# Patient Record
Sex: Female | Born: 1960 | Hispanic: No | Marital: Single | Smoking: Never smoker
Health system: Southern US, Community
[De-identification: ages and names within clinical notes are randomized; demographics above are authoritative.]

## PROBLEM LIST (undated history)

## (undated) DIAGNOSIS — I509 Heart failure, unspecified: Secondary | ICD-10-CM

## (undated) DIAGNOSIS — I10 Essential (primary) hypertension: Secondary | ICD-10-CM

## (undated) DIAGNOSIS — J9691 Respiratory failure, unspecified with hypoxia: Secondary | ICD-10-CM

## (undated) DIAGNOSIS — Z6832 Body mass index (BMI) 32.0-32.9, adult: Secondary | ICD-10-CM

## (undated) DIAGNOSIS — D509 Iron deficiency anemia, unspecified: Secondary | ICD-10-CM

## (undated) DIAGNOSIS — I5032 Chronic diastolic (congestive) heart failure: Secondary | ICD-10-CM

## (undated) DIAGNOSIS — E66811 Obesity, class 1: Secondary | ICD-10-CM

## (undated) DIAGNOSIS — E669 Obesity, unspecified: Secondary | ICD-10-CM

## (undated) DIAGNOSIS — R6510 Systemic inflammatory response syndrome (SIRS) of non-infectious origin without acute organ dysfunction: Secondary | ICD-10-CM

## (undated) DIAGNOSIS — I272 Pulmonary hypertension, unspecified: Secondary | ICD-10-CM

## (undated) HISTORY — DX: Heart failure, unspecified: I50.9

## (undated) HISTORY — PX: APPENDECTOMY: SHX54

---

## 2007-11-20 ENCOUNTER — Emergency Department (HOSPITAL_COMMUNITY): Admission: EM | Admit: 2007-11-20 | Discharge: 2007-11-20 | Payer: Self-pay | Admitting: Emergency Medicine

## 2013-11-16 ENCOUNTER — Emergency Department (HOSPITAL_COMMUNITY)
Admission: EM | Admit: 2013-11-16 | Discharge: 2013-11-16 | Disposition: A | Discharge status: Home / Self Care | Payer: BC Managed Care – PPO | Attending: Emergency Medicine | Admitting: Emergency Medicine

## 2013-11-16 ENCOUNTER — Emergency Department (HOSPITAL_COMMUNITY): Payer: BC Managed Care – PPO

## 2013-11-16 ENCOUNTER — Encounter (HOSPITAL_COMMUNITY): Payer: Self-pay | Admitting: Emergency Medicine

## 2013-11-16 DIAGNOSIS — B9789 Other viral agents as the cause of diseases classified elsewhere: Secondary | ICD-10-CM | POA: Insufficient documentation

## 2013-11-16 DIAGNOSIS — J9801 Acute bronchospasm: Secondary | ICD-10-CM | POA: Insufficient documentation

## 2013-11-16 DIAGNOSIS — B349 Viral infection, unspecified: Secondary | ICD-10-CM

## 2013-11-16 DIAGNOSIS — R059 Cough, unspecified: Secondary | ICD-10-CM

## 2013-11-16 DIAGNOSIS — M25559 Pain in unspecified hip: Secondary | ICD-10-CM | POA: Insufficient documentation

## 2013-11-16 DIAGNOSIS — R05 Cough: Secondary | ICD-10-CM

## 2013-11-16 LAB — I-STAT TROPONIN, ED: Troponin i, poc: 0 ng/mL (ref 0.00–0.08)

## 2013-11-16 LAB — BASIC METABOLIC PANEL
BUN: 14 mg/dL (ref 6–23)
CALCIUM: 9.5 mg/dL (ref 8.4–10.5)
CHLORIDE: 101 meq/L (ref 96–112)
CO2: 24 meq/L (ref 19–32)
CREATININE: 0.46 mg/dL — AB (ref 0.50–1.10)
GFR calc Af Amer: >90 mL/min (ref >90)
GFR calc non Af Amer: >90 mL/min (ref >90)
GLUCOSE: 100 mg/dL — AB (ref 70–99)
Potassium: 3.9 mEq/L (ref 3.7–5.3)
Sodium: 138 mEq/L (ref 137–147)

## 2013-11-16 LAB — CBC
HEMATOCRIT: 36.8 % (ref 36.0–46.0)
Hemoglobin: 12.7 g/dL (ref 12.0–15.0)
MCH: 21.9 pg — AB (ref 26.0–34.0)
MCHC: 34.5 g/dL (ref 30.0–36.0)
MCV: 63.4 fL — AB (ref 78.0–100.0)
Platelets: 276 10*3/uL (ref 150–400)
RBC: 5.8 MIL/uL — AB (ref 3.87–5.11)
RDW: 15.8 % — ABNORMAL HIGH (ref 11.5–15.5)
WBC: 16.8 10*3/uL — AB (ref 4.0–10.5)

## 2013-11-16 LAB — D-DIMER, QUANTITATIVE (NOT AT ARMC): D DIMER QUANT: 1.33 ug{FEU}/mL — AB (ref 0.00–0.48)

## 2013-11-16 LAB — PRO B NATRIURETIC PEPTIDE: PRO B NATRI PEPTIDE: 153.8 pg/mL — AB (ref 0–125)

## 2013-11-16 MED ORDER — AEROCHAMBER PLUS W/MASK MISC
1.0000 | Freq: Once | Status: DC
  Filled 2013-11-16: qty 1

## 2013-11-16 MED ORDER — IOHEXOL 350 MG/ML SOLN
100.0000 mL | Freq: Once | INTRAVENOUS | Status: AC | PRN
  Administered 2013-11-16: 100 mL via INTRAVENOUS

## 2013-11-16 MED ORDER — ALBUTEROL SULFATE (2.5 MG/3ML) 0.083% IN NEBU
5.0000 mg | INHALATION_SOLUTION | Freq: Once | RESPIRATORY_TRACT | Status: AC
  Administered 2013-11-16: 5 mg via RESPIRATORY_TRACT
  Filled 2013-11-16: qty 6

## 2013-11-16 MED ORDER — ALBUTEROL SULFATE HFA 108 (90 BASE) MCG/ACT IN AERS
2.0000 | INHALATION_SPRAY | RESPIRATORY_TRACT | Status: DC | PRN
  Filled 2013-11-16: qty 6.7

## 2013-11-16 NOTE — Discharge Instructions (Signed)
Return to the ED with any concerns including difficulty breathing despite using albuterol every 4 hours, not drinking fluids, decreased urine output, vomiting and not able to keep down liquids or medications, decreased level of alertness/lethargy, or any other alarming symptoms °

## 2013-11-16 NOTE — ED Notes (Signed)
Respiratory called for spacer with mask.

## 2013-11-16 NOTE — ED Provider Notes (Signed)
CSN: [id number]     Arrival date & time 11/16/13  1104 History   First MD Initiated Contact with Patient 11/16/13 1232     Chief Complaint  Patient presents with  . Cough  . Shortness of Breath  . Hip Pain     (Consider location/radiation/quality/duration/timing/severity/associated sxs/prior Treatment) HPI Pt presents with c/o cough and shortness of breath.  Pt has been coughing up mucous.  No fever/chills.  Feels chest pain with coughing.  No leg swelling, no recent travel/trauma/surgery.  No recent sick contacts.  Has also c/o body aches and pain on right side of chest and left hip.  No difficulty walking.  No falls or trauma.  There are no other associated systemic symptoms, there are no other alleviating or modifying factors.   History reviewed. No pertinent past medical history. Past Surgical History  Procedure Laterality Date  . Appendectomy     History reviewed. No pertinent family history. History  Substance Use Topics  . Smoking status: Never Smoker   . Smokeless tobacco: Not on file  . Alcohol Use: No   OB History   Grav Para Term Preterm Abortions TAB SAB Ect Mult Living                 Review of Systems ROS reviewed and all otherwise negative except for mentioned in HPI    Allergies  Review of patient's allergies indicates no known allergies.  Home Medications   Prior to Admission medications   Medication Sig Start Date End Date Taking? Authorizing Provider  acetaminophen (TYLENOL) 325 MG tablet Take 650 mg by mouth every 6 (six) hours as needed (pain).   Yes Historical Provider, MD  Pseudoephedrine-APAP-DM (DAYQUIL MULTI-SYMPTOM COLD/FLU PO) Take 30 mLs by mouth 2 (two) times daily as needed (flu-like symptoms ( cough)).   Yes Historical Provider, MD   BP 176/90  Pulse 96  Temp(Src) 98.7 F (37.1 C) (Oral)  Resp 20  SpO2 95% Vitals reviewed Physical Exam Physical Examination: General appearance - alert, well appearing, and in no distress Mental  status - alert, oriented to person, place, and time Eyes - no conjunctival injection, no scleral icterus Mouth - mucous membranes moist, pharynx normal without lesions Chest - clear to auscultation, no wheezes, rales or rhonchi, symmetric air entry Heart - normal rate, regular rhythm, normal S1, S2, no murmurs, rubs, clicks or gallops Abdomen - soft, nontender, nondistended, no masses or organomegaly Extremities - peripheral pulses normal, no pedal edema, no clubbing or cyanosis Skin - normal coloration and turgor, no rashes  ED Course  Procedures (including critical care time) Labs Review Labs Reviewed  CBC - Abnormal; Notable for the following:    WBC 16.8 (*)    RBC 5.80 (*)    MCV 63.4 (*)    MCH 21.9 (*)    RDW 15.8 (*)    All other components within normal limits  BASIC METABOLIC PANEL - Abnormal; Notable for the following:    Glucose, Bld 100 (*)    Creatinine, Ser 0.46 (*)    All other components within normal limits  PRO B NATRIURETIC PEPTIDE - Abnormal; Notable for the following:    Pro B Natriuretic peptide (BNP) 153.8 (*)    All other components within normal limits  D-DIMER, QUANTITATIVE - Abnormal; Notable for the following:    D-Dimer, Quant 1.33 (*)    All other components within normal limits  I-STAT TROPOININ, ED    Imaging Review Ct Angio Chest Pe W/cm &/or  Wo Cm  11/16/2013   CLINICAL DATA:  Upper chest pain.  Evaluate for PE.  EXAM: CT ANGIOGRAPHY CHEST WITH CONTRAST  TECHNIQUE: Multidetector CT imaging of the chest was performed using the standard protocol during bolus administration of intravenous contrast. Multiplanar CT image reconstructions and MIPs were obtained to evaluate the vascular anatomy.  CONTRAST:  100mL OMNIPAQUE IOHEXOL 350 MG/ML SOLN  COMPARISON:  Chest x-ray earlier today.  FINDINGS: Motion degraded exam. Decrease sensitivity in the detection of pulmonary emboli due to inability to suspend respiration.  Mediastinum: No filling defects are  noted within the pulmonary arterial tree to suggest pulmonary emboli. Heart size is increased. No pericardial fluid, thickening or calcification. No acute abnormality of the thoracic aorta or other great vessels of the mediastinum. No pathologically enlarged mediastinal or hilar lymph nodes. Old granulomatous changes. The esophagus is normal in appearance.  Lungs/Pleura: No consolidative airspace disease. No pleural effusions. No suspicious appearing pulmonary nodules or masses. Scattered granulomata.  Upper Abdomen: Visualized portions of the upper abdomen are unremarkable.  Musculoskeletal: No aggressive appearing lytic or blastic lesions are noted in the visualized portions of the skeleton.  Review of the MIP images confirms the above findings.  IMPRESSION: Sequelae of remote granulomatous disease. Cardiomegaly. No active infiltrates.  Respiratory motion degraded exam without visible pulmonary emboli. Sensitivity is reduced however for the detection of small subsegmental defects.   Electronically Signed   By: Davonna BellingJohn  Curnes M.D.   On: 11/16/2013 14:25   Dg Chest Port 1 View  11/16/2013   CLINICAL DATA:  Cough and short of breath  EXAM: PORTABLE CHEST - 1 VIEW  COMPARISON:  None.  FINDINGS: Mild cardiac enlargement without heart failure. Mild right lower lobe atelectasis. Negative for pneumonia or effusion.  IMPRESSION: No active disease.   Electronically Signed   By: Marlan Palauharles  Clark M.D.   On: 11/16/2013 13:01     EKG Interpretation   Date/Time:  Tuesday Nov 16 2013 12:31:22 EDT Ventricular Rate:  87 PR Interval:  154 QRS Duration: 74 QT Interval:  384 QTC Calculation: 462 R Axis:   73 Text Interpretation:  Sinus rhythm Consider left ventricular hypertrophy  No old tracing to compare Confirmed by [facility] Center[facility]  MD, Shantoya Geurts [phone]) on  11/16/2013 3:12:22 PM      MDM   Final diagnoses:  Cough  Bronchospasm  Viral infection    Pt presenting with c/o productive cough, mild wheezing on exam.  Felt  improved after albuterol in the ED.  No PE on CT scan after elevated d-dimer.  Pt discharged with albuterol inhaler for home use. Advised supportive care, ibuprofen for body aches.  Discharged with strict return precautions.  Pt agreeable with plan.    Ethelda ChickMartha K Linker, MD 11/17/13 [phone]

## 2013-11-16 NOTE — ED Notes (Signed)
Pt c/o productive cough x 4 days.  States she feels SOB at rest.  Pt's RA sat was 98%. Also c/o Lt hip pain since Friday.  No injury.

## 2015-07-10 ENCOUNTER — Observation Stay (HOSPITAL_COMMUNITY)
Admission: EM | Admit: 2015-07-10 | Discharge: 2015-07-12 | Disposition: A | Discharge status: Home / Self Care | Payer: Self-pay | Attending: Internal Medicine | Admitting: Internal Medicine

## 2015-07-10 ENCOUNTER — Emergency Department (HOSPITAL_COMMUNITY): Payer: Self-pay

## 2015-07-10 ENCOUNTER — Encounter (HOSPITAL_COMMUNITY): Payer: Self-pay | Admitting: *Deleted

## 2015-07-10 DIAGNOSIS — I509 Heart failure, unspecified: Secondary | ICD-10-CM

## 2015-07-10 DIAGNOSIS — R0789 Other chest pain: Secondary | ICD-10-CM | POA: Insufficient documentation

## 2015-07-10 DIAGNOSIS — R7989 Other specified abnormal findings of blood chemistry: Secondary | ICD-10-CM | POA: Insufficient documentation

## 2015-07-10 DIAGNOSIS — IMO0001 Reserved for inherently not codable concepts without codable children: Secondary | ICD-10-CM

## 2015-07-10 DIAGNOSIS — I10 Essential (primary) hypertension: Secondary | ICD-10-CM | POA: Insufficient documentation

## 2015-07-10 DIAGNOSIS — I119 Hypertensive heart disease without heart failure: Secondary | ICD-10-CM

## 2015-07-10 DIAGNOSIS — R0609 Other forms of dyspnea: Principal | ICD-10-CM | POA: Diagnosis present

## 2015-07-10 DIAGNOSIS — R030 Elevated blood-pressure reading, without diagnosis of hypertension: Secondary | ICD-10-CM

## 2015-07-10 DIAGNOSIS — I43 Cardiomyopathy in diseases classified elsewhere: Secondary | ICD-10-CM

## 2015-07-10 DIAGNOSIS — R0602 Shortness of breath: Secondary | ICD-10-CM | POA: Insufficient documentation

## 2015-07-10 DIAGNOSIS — R2240 Localized swelling, mass and lump, unspecified lower limb: Secondary | ICD-10-CM | POA: Insufficient documentation

## 2015-07-10 HISTORY — DX: Essential (primary) hypertension: I10

## 2015-07-10 LAB — BASIC METABOLIC PANEL
Anion gap: 11 (ref 5–15)
BUN: 12 mg/dL (ref 6–20)
CHLORIDE: 104 mmol/L (ref 101–111)
CO2: 26 mmol/L (ref 22–32)
CREATININE: 0.6 mg/dL (ref 0.44–1.00)
Calcium: 8.8 mg/dL — ABNORMAL LOW (ref 8.9–10.3)
GFR calc Af Amer: >60 mL/min (ref >60)
GFR calc non Af Amer: >60 mL/min (ref >60)
Glucose, Bld: 132 mg/dL — ABNORMAL HIGH (ref 65–99)
POTASSIUM: 3.7 mmol/L (ref 3.5–5.1)
SODIUM: 141 mmol/L (ref 135–145)

## 2015-07-10 LAB — CBC
HEMATOCRIT: 38.9 % (ref 36.0–46.0)
Hemoglobin: 12.8 g/dL (ref 12.0–15.0)
MCH: 22 pg — AB (ref 26.0–34.0)
MCHC: 32.9 g/dL (ref 30.0–36.0)
MCV: 66.7 fL — AB (ref 78.0–100.0)
PLATELETS: 250 10*3/uL (ref 150–400)
RBC: 5.83 MIL/uL — ABNORMAL HIGH (ref 3.87–5.11)
RDW: 16.7 % — AB (ref 11.5–15.5)
WBC: 12.2 10*3/uL — AB (ref 4.0–10.5)

## 2015-07-10 LAB — I-STAT TROPONIN, ED: Troponin i, poc: 0.03 ng/mL (ref 0.00–0.08)

## 2015-07-10 LAB — BRAIN NATRIURETIC PEPTIDE: B NATRIURETIC PEPTIDE 5: 567.3 pg/mL — AB (ref 0.0–100.0)

## 2015-07-10 MED ORDER — ALBUTEROL SULFATE (2.5 MG/3ML) 0.083% IN NEBU
5.0000 mg | INHALATION_SOLUTION | Freq: Once | RESPIRATORY_TRACT | Status: AC
  Administered 2015-07-10: 5 mg via RESPIRATORY_TRACT

## 2015-07-10 MED ORDER — LABETALOL HCL 5 MG/ML IV SOLN
10.0000 mg | Freq: Once | INTRAVENOUS | Status: AC
  Administered 2015-07-10: 10 mg via INTRAVENOUS
  Filled 2015-07-10: qty 4

## 2015-07-10 MED ORDER — FUROSEMIDE 10 MG/ML IJ SOLN
40.0000 mg | Freq: Once | INTRAMUSCULAR | Status: AC
  Administered 2015-07-10: 40 mg via INTRAVENOUS
  Filled 2015-07-10: qty 4

## 2015-07-10 MED ORDER — ALBUTEROL SULFATE (2.5 MG/3ML) 0.083% IN NEBU
INHALATION_SOLUTION | RESPIRATORY_TRACT | Status: AC
  Filled 2015-07-10: qty 6

## 2015-07-10 NOTE — ED Notes (Signed)
Called lab ref. BNP.  St's they have blood and will run it

## 2015-07-10 NOTE — ED Provider Notes (Signed)
CSN: [id number]     Arrival date & time 07/10/15  1649 History   First MD Initiated Contact with Patient 07/10/15 2102     Chief Complaint  Patient presents with  . Cough  . Shortness of Breath     (Consider location/radiation/quality/duration/timing/severity/associated sxs/prior Treatment) Patient is a 54 y.o. female presenting with shortness of breath. The history is provided by the patient and a relative.  Shortness of Breath Severity:  Moderate Onset quality:  Gradual Duration:  2 months Timing:  Constant Progression:  Worsening Chronicity:  New Context: not strong odors and not URI   Relieved by:  Nothing Worsened by:  Activity and exertion Ineffective treatments:  Rest and position changes Associated symptoms: chest pain and cough   Associated symptoms: no abdominal pain, no claudication, no diaphoresis, no ear pain, no fever, no headaches, no hemoptysis, no neck pain, no rash, no sore throat, no sputum production, no syncope, no vomiting and no wheezing   Risk factors: obesity   Risk factors: no hx of PE/DVT and no prolonged immobilization     Past Medical History  Diagnosis Date  . Hypertension    Past Surgical History  Procedure Laterality Date  . Appendectomy     History reviewed. No pertinent family history. Social History  Substance Use Topics  . Smoking status: Never Smoker   . Smokeless tobacco: None  . Alcohol Use: No   OB History    No data available     Review of Systems  Constitutional: Negative for fever and diaphoresis.  HENT: Negative for ear pain and sore throat.   Eyes: Negative for pain.  Respiratory: Positive for cough, chest tightness and shortness of breath. Negative for hemoptysis, sputum production and wheezing.   Cardiovascular: Positive for chest pain and leg swelling. Negative for claudication and syncope.  Gastrointestinal: Negative for nausea, vomiting, abdominal pain and diarrhea.  Genitourinary: Negative for dysuria.   Musculoskeletal: Negative for neck pain.  Skin: Negative for rash.  Neurological: Negative for dizziness and headaches.      Allergies  Review of patient's allergies indicates no known allergies.  Home Medications   Prior to Admission medications   Medication Sig Start Date End Date Taking? Authorizing Provider  acetaminophen (TYLENOL) 325 MG tablet Take 650 mg by mouth every 6 (six) hours as needed (pain).   Yes Historical Provider, MD  Pseudoephedrine-APAP-DM (DAYQUIL MULTI-SYMPTOM COLD/FLU PO) Take 30 mLs by mouth 2 (two) times daily as needed (flu-like symptoms ( cough)).   Yes Historical Provider, MD   BP 164/93 mmHg  Pulse 90  Temp(Src) 98.3 F (36.8 C) (Oral)  Resp 37  Ht  (1.575 m)  Wt 81.761 kg  BMI 32.96 kg/m2  SpO2 99% Physical Exam  Constitutional: She appears well-developed and well-nourished. She appears ill.  HENT:  Head: Head is without abrasion and without contusion.  Mouth/Throat: Oropharynx is clear and moist and mucous membranes are normal.  Eyes: Conjunctivae and EOM are normal. Pupils are equal, round, and reactive to light.  Neck: Normal range of motion.  Cardiovascular: Normal rate and regular rhythm.  Exam reveals no decreased pulses.   No murmur heard. Pulmonary/Chest: Effort normal. No accessory muscle usage. No respiratory distress. She has wheezes (mild end expiratory wheezing consistent with cardiac wheeze). She has rales in the right lower field and the left lower field.  Abdominal: Normal appearance. She exhibits no distension and no fluid wave. There is no tenderness. There is no rigidity, no guarding, no tenderness  at McBurney's point and negative Murphy's sign.  Neurological: She is alert. She has normal strength. No cranial nerve deficit or sensory deficit. GCS eye subscore is 4. GCS verbal subscore is 5. GCS motor subscore is 6.  Skin: No rash noted.  Psychiatric: She has a normal mood and affect. Her speech is normal and behavior is  normal. Cognition and memory are normal.    ED Course  Procedures (including critical care time) Labs Review Labs Reviewed  BASIC METABOLIC PANEL - Abnormal; Notable for the following:    Glucose, Bld 132 (*)    Calcium 8.8 (*)    All other components within normal limits  CBC - Abnormal; Notable for the following:    WBC 12.2 (*)    RBC 5.83 (*)    MCV 66.7 (*)    MCH 22.0 (*)    RDW 16.7 (*)    All other components within normal limits  BRAIN NATRIURETIC PEPTIDE - Abnormal; Notable for the following:    B Natriuretic Peptide 567.3 (*)    All other components within normal limits  I-STAT TROPOININ, ED    Imaging Review Dg Chest 2 View  07/10/2015  CLINICAL DATA:  54 year old female with shortness of breath EXAM: CHEST  2 VIEW COMPARISON:  Chest CT 11/16/2013 FINDINGS: Two views of the chest demonstrate cardiomegaly with increased interstitial prominence may represent a degree of congestive changes. There is no focal consolidation, pleural effusion, or pneumothorax. Hilar calcified lymph nodes compatible with old granuloma. The osseous structures appear unremarkable. IMPRESSION: Cardiomegaly with mild congestive changes.  No focal consolidation. Electronically Signed   By: Elgie CollardArash  Radparvar M.D.   On: 07/10/2015 18:13   I have personally reviewed and evaluated these images and lab results as part of my medical decision-making.   EKG Interpretation   Date/Time:  Monday July 10 2015 16:51:29 EST Ventricular Rate:  105 PR Interval:  154 QRS Duration: 72 QT Interval:  346 QTC Calculation: 457 R Axis:   76 Text Interpretation:  Sinus tachycardia Right atrial enlargement Left  ventricular hypertrophy with repolarization abnormality Abnormal ECG since  last tracing no significant change Confirmed by MILLER  MD, BRIAN ([id number])  on 07/10/2015 9:34:37 PM      MDM   Final diagnoses:  Elevated BP  Elevated brain natriuretic peptide (BNP) level  Dyspnea on exertion     54 year old [place] female with no known past medical history presents the setting of dyspnea on exertion. Patient and family report over the last 2 months patient has had intermittent cough and shortness of breath which is worsened by any exertion. They reported the last several weeks she can now only take several steps without significant shortness of breath. They report the cough has been nonproductive at times she does have some mild chest pain. She denies any chest pain today. Family denies any fevers, rashes, recent sick contacts, recent travel, history of blood clots.  Initial vital signs revealed significant hypertension with systolics in the 200s. Patient given IV labetalol and IV Lasix and setting concern for CHF.  On examination patient does have bilateral lower lobe rales and mild end expiratory wheezing consistent with cardiac wheeze. Patient with bilateral 2+ pitting edema. Bedside echo performed by myself and Dr. Hyacinth MeekerMiller consistent with left ventricular hypertrophy without pericardial effusion. No signs of obvious hypo-or hyperdynamic contractility. No significant RV dilatation. In setting of the patient's symptoms I'm concerned for CHF versus ACS versus pneumonia.  On reassessment after IV Lasix and antihypertensives patient's  blood pressure improved and symptoms improving.  Chest x-ray consistent with cardiomegaly and pulmonary congestion. No significant elevation in troponin. BNP elevated consistent with CHF. EKG reveals left ventricular hypertrophy. No significant electrolyte abnormalities noted. No ST segment elevation or depression or acute ischemia noted on EKG. As patient is significantly dyspneic on examination with any exertion will admit to medicine at this time for further management of likely CHF. Patient stable at time of admission. Case discussed with Dr. Julian Reil of Hospitalist group  Attending is seen and evaluated patient and Dr. Hyacinth Meeker is in agreement of  plan.    Stacy Gardner, MD 07/11/15 9563  Eber Hong, MD 07/11/15 [phone]

## 2015-07-10 NOTE — ED Notes (Signed)
Pt placed on 02 via [place] at 2LPM 

## 2015-07-10 NOTE — ED Provider Notes (Signed)
I saw and evaluated the patient, reviewed the resident's note and I agree with the findings and plan.  Pertinent History: The patient is a 54 year old female, she has no history of heart disease or lung disease, she presents with increased shortness of breath on exertion and orthopnea, she is obese Pertinent Exam findings: The patient has hypertension, she has a slight expiratory wheeze, she has no rales, she does have slight bilateral symmetrical pitting edema of the lower extremities, no JVD. Nontender abdomen, no distress. Normal respiratory rate, no increased work of breathing.  W/u for CHF, less likely reactive airway disease Appears to have LVH - cardiac enlargment  No effusion on bedside US - images not archived   EKG Interpretation  Date/Time:  Monday July 10 2015 16:51:29 EST Ventricular Rate:  105 PR Interval:  154 QRS Duration: 72 QT Interval:  346 QTC Calculation: 457 R Axis:   76 Text Interpretation:  Sinus tachycardia Right atrial enlargement Left ventricular hypertrophy with repolarization abnormality Abnormal ECG since last tracing no significant change Confirmed by Clyde Upshaw  MD, Shanta Hartner ([id number]) on 07/10/2015 9:34:37 PM       I personally interpreted the EKG as well as the resident and agree with the interpretation on the resident's chart.  Final diagnoses:  Elevated BP  Elevated brain natriuretic peptide (BNP) level  Dyspnea on exertion      Eber HongBrian Shandelle Borrelli, MD 07/11/15 1046

## 2015-07-10 NOTE — ED Notes (Signed)
Pt reports shortness of breath and cough x1 month, progressively worse - pt reports now she experiences dyspnea with exertion as well.

## 2015-07-10 NOTE — ED Notes (Signed)
Ambulated to restroom  

## 2015-07-11 ENCOUNTER — Observation Stay (HOSPITAL_BASED_OUTPATIENT_CLINIC_OR_DEPARTMENT_OTHER): Payer: Self-pay

## 2015-07-11 DIAGNOSIS — I119 Hypertensive heart disease without heart failure: Secondary | ICD-10-CM

## 2015-07-11 DIAGNOSIS — I110 Hypertensive heart disease with heart failure: Secondary | ICD-10-CM

## 2015-07-11 DIAGNOSIS — I509 Heart failure, unspecified: Secondary | ICD-10-CM

## 2015-07-11 DIAGNOSIS — I43 Cardiomyopathy in diseases classified elsewhere: Secondary | ICD-10-CM

## 2015-07-11 DIAGNOSIS — R0609 Other forms of dyspnea: Secondary | ICD-10-CM

## 2015-07-11 MED ORDER — FUROSEMIDE 40 MG PO TABS
40.0000 mg | ORAL_TABLET | Freq: Every day | ORAL | Status: DC
  Administered 2015-07-11 – 2015-07-12 (×2): 40 mg via ORAL
  Filled 2015-07-11 (×2): qty 1

## 2015-07-11 MED ORDER — SODIUM CHLORIDE 0.9 % IV SOLN: 250.0000 mL | INTRAVENOUS | Status: DC | PRN

## 2015-07-11 MED ORDER — SODIUM CHLORIDE 0.9 % IJ SOLN: 3.0000 mL | INTRAMUSCULAR | Status: DC | PRN

## 2015-07-11 MED ORDER — HEPARIN SODIUM (PORCINE) 5000 UNIT/ML IJ SOLN
5000.0000 [IU] | Freq: Three times a day (TID) | INTRAMUSCULAR | Status: DC
  Administered 2015-07-11 – 2015-07-12 (×4): 5000 [IU] via SUBCUTANEOUS
  Filled 2015-07-11 (×3): qty 1

## 2015-07-11 MED ORDER — INFLUENZA VAC SPLIT QUAD 0.5 ML IM SUSY
0.5000 mL | PREFILLED_SYRINGE | INTRAMUSCULAR | Status: AC
  Administered 2015-07-12: 0.5 mL via INTRAMUSCULAR
  Filled 2015-07-11: qty 0.5

## 2015-07-11 MED ORDER — ACETAMINOPHEN 325 MG PO TABS: 650.0000 mg | ORAL_TABLET | ORAL | Status: DC | PRN

## 2015-07-11 MED ORDER — SODIUM CHLORIDE 0.9 % IJ SOLN
3.0000 mL | Freq: Two times a day (BID) | INTRAMUSCULAR | Status: DC
  Administered 2015-07-11 – 2015-07-12 (×3): 3 mL via INTRAVENOUS

## 2015-07-11 MED ORDER — LISINOPRIL 5 MG PO TABS
5.0000 mg | ORAL_TABLET | Freq: Every day | ORAL | Status: DC
  Administered 2015-07-11: 5 mg via ORAL
  Filled 2015-07-11: qty 1

## 2015-07-11 MED ORDER — LABETALOL HCL 5 MG/ML IV SOLN: 10.0000 mg | INTRAVENOUS | Status: DC | PRN

## 2015-07-11 MED ORDER — ONDANSETRON HCL 4 MG/2ML IJ SOLN: 4.0000 mg | Freq: Four times a day (QID) | INTRAMUSCULAR | Status: DC | PRN

## 2015-07-11 MED ORDER — BENZONATATE 100 MG PO CAPS
100.0000 mg | ORAL_CAPSULE | Freq: Three times a day (TID) | ORAL | Status: DC | PRN
  Administered 2015-07-11 – 2015-07-12 (×2): 100 mg via ORAL
  Filled 2015-07-11: qty 1

## 2015-07-11 MED ORDER — METOPROLOL SUCCINATE ER 25 MG PO TB24
12.5000 mg | ORAL_TABLET | Freq: Every day | ORAL | Status: DC
  Administered 2015-07-11 – 2015-07-12 (×2): 12.5 mg via ORAL
  Filled 2015-07-11 (×2): qty 1

## 2015-07-11 NOTE — Progress Notes (Signed)
   [facility]                                                                              Patient Demographics  Shannon Simpson, is a [age] y.o. female, DOB - 1960-09-15, WUJ:[id number]  Admit date - 07/10/2015   Admitting Physician Hillary BowJared M Gardner, DO  Outpatient Primary MD for the patient is No primary care provider on file.  LOS -    Chief Complaint  Patient presents with  . Cough  . Shortness of Breath      HPI on 07/11/2015 by Dr. Lyda PeroneJared Gardner Shannon Simpson is a 54 y.o. female with h/o HTN, dosent see doctor so this has been uncontrolled. Patient presents to the ED with 1 month history of worsening SOB, cough, DOE. No chest pain. Also has BLE edema.  Assessment & Plan   Patient admitted earlier today by Dr. Lyda PeroneJared Gardner. Please see full H&P for details. Agree with current assessment and plan.  Dyspnea on exertion, acute diastolic heart failure -Upon admission BNP 567 -Chest x-ray: Cardiomegaly with mild congestive changes, no focal consolidation -Echocardiogram obtained: EF 50-55%, wall motion was normal, grade 1 diastolic dysfunction -Continue to monitor intake and output, daily weights -Continue Lasix, metoprolol, lisinopril  Accelerated hypertension -Upon admission, blood pressure 218/122 -Patient takes no blood pressure medications at home -Continue lisinopril, metoprolol, Lasix  Acute respiratory failure  -likely secondary to the above -Will attempt to wean off of nasal cannula  Leukocytosis -I clearly reactive, continue monitor CBC  Code Status: full  Family Communication: Family at bedside  Disposition Plan: Admitted  Time Spent in minutes   30 minutes  Procedures  Echocardiogram  Consults   None  DVT Prophylaxis  Heparin   Adaliz Dobis D.O. on 07/11/2015 at 2:24 PM  Between 7am to 7pm - Pager - [phone]  After 7pm go to [email or web address] - password [email or web address]  And look for the night coverage person covering for me after  hours  [facility] Office  [phone]

## 2015-07-11 NOTE — Progress Notes (Signed)
  Echocardiogram 2D Echocardiogram has been performed.  Dorothey BasemanReel, Blayn Whetsell M 07/11/2015, 10:46 AM

## 2015-07-11 NOTE — Progress Notes (Signed)
Pt transferred to 6E09 from [facility] ED. Dondra SpryMoore, Terese Heier Islee, RN

## 2015-07-11 NOTE — H&P (Signed)
[facility] History and Physical  Krysteena Ridlon OEU:[id number] DOB: 01/24/1961 DOA: 07/10/2015  Referring physician: EDP PCP: No primary care provider on file.   Chief Complaint: SOB   HPI: Shannon Davenportmphone Lueth is a 54 y.o. female with h/o HTN, dosent see doctor so this has been uncontrolled.  Patient presents to the ED with 1 month history of worsening SOB, cough, DOE.  No chest pain.  Also has BLE edema.  Review of Systems: Systems reviewed.  As above, otherwise negative  Past Medical History  Diagnosis Date  . Hypertension    Past Surgical History  Procedure Laterality Date  . Appendectomy     Social History:  reports that she has never smoked. She does not have any smokeless tobacco history on file. She reports that she does not drink alcohol or use illicit drugs.  No Known Allergies  History reviewed. No pertinent family history.   Prior to Admission medications   Medication Sig Start Date End Date Taking? Authorizing Provider  acetaminophen (TYLENOL) 325 MG tablet Take 650 mg by mouth every 6 (six) hours as needed (pain).   Yes Historical Provider, MD  Pseudoephedrine-APAP-DM (DAYQUIL MULTI-SYMPTOM COLD/FLU PO) Take 30 mLs by mouth 2 (two) times daily as needed (flu-like symptoms ( cough)).   Yes Historical Provider, MD   Physical Exam: Filed Vitals:   07/10/15 2345 07/11/15 0000  BP: 173/94 164/93  Pulse: 86 90  Temp:    Resp: 26 37    BP 164/93 mmHg  Pulse 90  Temp(Src) 98.3 F (36.8 C) (Oral)  Resp 37  Ht 5\' 2"  (1.575 m)  Wt 81.761 kg (180 lb 4 oz)  BMI 32.96 kg/m2  SpO2 99%  General Appearance:    Alert, oriented, no distress, appears stated age  Head:    Normocephalic, atraumatic  Eyes:    PERRL, EOMI, sclera non-icteric        Nose:   Nares without drainage or epistaxis. Mucosa, turbinates normal  Throat:   Moist mucous membranes. Oropharynx without erythema or exudate.  Neck:   Supple. No carotid bruits.  No thyromegaly.  No  lymphadenopathy.   Back:     No CVA tenderness, no spinal tenderness  Lungs:     Clear to auscultation bilaterally, without wheezes, rhonchi or rales  Chest wall:    No tenderness to palpitation  Heart:    Regular rate and rhythm without murmurs, gallops, rubs  Abdomen:     Soft, non-tender, nondistended, normal bowel sounds, no organomegaly  Genitalia:    deferred  Rectal:    deferred  Extremities:   No clubbing, cyanosis or edema.  Pulses:   2+ and symmetric all extremities  Skin:   Skin color, texture, turgor normal, no rashes or lesions  Lymph nodes:   Cervical, supraclavicular, and axillary nodes normal  Neurologic:   CNII-XII intact. Normal strength, sensation and reflexes      throughout    Labs on Admission:  Basic Metabolic Panel:  Recent Labs Lab 07/10/15 1732  NA 141  K 3.7  CL 104  CO2 26  GLUCOSE 132*  BUN 12  CREATININE 0.60  CALCIUM 8.8*   Liver Function Tests: No results for input(s): AST, ALT, ALKPHOS, BILITOT, PROT, ALBUMIN in the last 168 hours. No results for input(s): LIPASE, AMYLASE in the last 168 hours. No results for input(s): AMMONIA in the last 168 hours. CBC:  Recent Labs Lab 07/10/15 1732  WBC 12.2*  HGB 12.8  HCT 38.9  MCV 66.7*  PLT 250   Cardiac Enzymes: No results for input(s): CKTOTAL, CKMB, CKMBINDEX, TROPONINI in the last 168 hours.  BNP (last 3 results) No results for input(s): PROBNP in the last 8760 hours. CBG: No results for input(s): GLUCAP in the last 168 hours.  Radiological Exams on Admission: Dg Chest 2 View  07/10/2015  CLINICAL DATA:  54 year old female with shortness of breath EXAM: CHEST  2 VIEW COMPARISON:  Chest CT 11/16/2013 FINDINGS: Two views of the chest demonstrate cardiomegaly with increased interstitial prominence may represent a degree of congestive changes. There is no focal consolidation, pleural effusion, or pneumothorax. Hilar calcified lymph nodes compatible with old granuloma. The osseous  structures appear unremarkable. IMPRESSION: Cardiomegaly with mild congestive changes.  No focal consolidation. Electronically Signed   By: Elgie Collard M.D.   On: 07/10/2015 18:13    EKG: Independently reviewed.  Assessment/Plan Active Problems:   Dyspnea on exertion   Hypertensive cardiomyopathy (HCC)   Acute CHF (congestive heart failure) (HCC)   1. Acute CHF - almost certainly represents hypertensive cardiomyopathy with LVH in this patient.  EKG demonstrates LVH, bedside echo done in ED demonstrates LVH, her initial BP on arrival to ED is 218/122, this is improved with labetalol and lasix.  CXR demonstrates cardiomegaly "with congestive changes".  She is feeling much better now that her BP is down to 160s systolic and she has gotten 40 of lasix. 1. Due to lack of OP follow up, admitting for obs 2. Starting lisinopril, metoprolol, lasix, due to low doses started of lisinopril and metoprolol, will almost certianly need these titrated up. 3. Labetalol PRN for now 4. Daily BMP to monitor renal function and potassium 5. Formal 2d echo ordered    Code Status: Full  Family Communication: Family at bedside Disposition Plan: Admit to obs   Time spent: 70 min  Kellyn Mansfield M. [facility] Pager [phone]  If 7AM-7PM, please contact the day team taking care of the patient [email or web address] Password [facility] 07/11/2015, 12:18 AM

## 2015-07-11 NOTE — ED Notes (Signed)
After ambulation to restroom, oxygen dropped to low 80's, improved with application of oxygen

## 2015-07-11 NOTE — Care Management Note (Signed)
Case Management Note  Patient Details  Name: Mitzi Davenportmphone Stotler MRN: [id number] Date of Birth: 02/15/1961  Subjective/Objective:       CM following for progression and d/c planning.             Action/Plan: 07/11/15 Noted that pt has no PCP, pt will be given , [facility] with instructions to call in late February or March, 2017, into on local clinics with copays and info on [facility].   Expected Discharge Date:                  Expected Discharge Plan:  Home/Self Care  In-House Referral:  NA  Discharge planning Services  CM Consult  Post Acute Care Choice:  NA Choice offered to:  NA  DME Arranged:    DME Agency:     HH Arranged:    HH Agency:     Status of Service:  Completed, signed off  Medicare Important Message Given:    Date Medicare IM Given:    Medicare IM give by:    Date Additional Medicare IM Given:    Additional Medicare Important Message give by:     If discussed at Long Length of Stay Meetings, dates discussed:    Additional Comments:  Starlyn SkeansRoyal, Aarionna Germer U, RN 07/11/2015, 11:57 AM

## 2015-07-11 NOTE — Care Management Note (Signed)
Case Management Note  Patient Details  Name: Shannon Simpson MRN: [id number] Date of Birth: 1960/11/19  Subjective/Objective:              CM following for progression and d/c planning.      Action/Plan: Noted that pt has no PCP, unable to schedule appointments for [facility]. This CM will provide info on [facility] as the pt may arrange an appointment after late February or March. Contacted [facility] at [address], the copay for this pt would be $100 up front then cost of treatment which would be billed later. The pt can apply for an [facility] by going by and picking up an application packet or having the packet mailed to them, which they will complete then return to the Clinic to complete the application process. This would cover the cost of the clinic visit. This CM will also provide a list of local primary resources.   Expected Discharge Date:                  Expected Discharge Plan:  Home/Self Care  In-House Referral:  NA  Discharge planning Services  CM Consult  Post Acute Care Choice:  NA Choice offered to:  NA  DME Arranged:    DME Agency:     HH Arranged:    HH Agency:     Status of Service:  Completed, signed off  Medicare Important Message Given:    Date Medicare IM Given:    Medicare IM give by:    Date Additional Medicare IM Given:    Additional Medicare Important Message give by:     If discussed at Long Length of Stay Meetings, dates discussed:    Additional Comments:  Starlyn SkeansRoyal, Shannon Lalone U, RN 07/11/2015, 12:06 PM

## 2015-07-11 NOTE — ED Notes (Signed)
Dr gardner seen pt

## 2015-07-12 DIAGNOSIS — I5031 Acute diastolic (congestive) heart failure: Secondary | ICD-10-CM

## 2015-07-12 DIAGNOSIS — D72829 Elevated white blood cell count, unspecified: Secondary | ICD-10-CM

## 2015-07-12 DIAGNOSIS — I10 Essential (primary) hypertension: Secondary | ICD-10-CM

## 2015-07-12 LAB — LIPID PANEL
Cholesterol: 183 mg/dL (ref 0–200)
HDL: 43 mg/dL (ref >40)
LDL Cholesterol: 121 mg/dL — ABNORMAL HIGH (ref 0–99)
Total CHOL/HDL Ratio: 4.3 RATIO
Triglycerides: 97 mg/dL (ref <150)
VLDL: 19 mg/dL (ref 0–40)

## 2015-07-12 LAB — CBC
HCT: 38.7 % (ref 36.0–46.0)
Hemoglobin: 12.9 g/dL (ref 12.0–15.0)
MCH: 22 pg — ABNORMAL LOW (ref 26.0–34.0)
MCHC: 33.3 g/dL (ref 30.0–36.0)
MCV: 66 fL — AB (ref 78.0–100.0)
PLATELETS: 230 10*3/uL (ref 150–400)
RBC: 5.86 MIL/uL — ABNORMAL HIGH (ref 3.87–5.11)
RDW: 16.8 % — AB (ref 11.5–15.5)
WBC: 10.4 10*3/uL (ref 4.0–10.5)

## 2015-07-12 LAB — BASIC METABOLIC PANEL
Anion gap: 11 (ref 5–15)
BUN: 13 mg/dL (ref 6–20)
CHLORIDE: 101 mmol/L (ref 101–111)
CO2: 28 mmol/L (ref 22–32)
CREATININE: 0.67 mg/dL (ref 0.44–1.00)
Calcium: 8.9 mg/dL (ref 8.9–10.3)
GFR calc Af Amer: >60 mL/min (ref >60)
GFR calc non Af Amer: >60 mL/min (ref >60)
Glucose, Bld: 109 mg/dL — ABNORMAL HIGH (ref 65–99)
Potassium: 3.6 mmol/L (ref 3.5–5.1)
SODIUM: 140 mmol/L (ref 135–145)

## 2015-07-12 LAB — TSH: TSH: 1.281 u[IU]/mL (ref 0.350–4.500)

## 2015-07-12 LAB — T4, FREE: FREE T4: 0.99 ng/dL (ref 0.61–1.12)

## 2015-07-12 MED ORDER — LISINOPRIL 10 MG PO TABS: 10.0000 mg | ORAL_TABLET | Freq: Every day | ORAL | Status: DC

## 2015-07-12 MED ORDER — FUROSEMIDE 40 MG PO TABS: 40.0000 mg | ORAL_TABLET | Freq: Every day | ORAL | Status: DC

## 2015-07-12 MED ORDER — METOPROLOL SUCCINATE ER 25 MG PO TB24: 12.5000 mg | ORAL_TABLET | Freq: Every day | ORAL | Status: DC

## 2015-07-12 NOTE — Care Management Note (Signed)
Case Management Note  Patient Details  Name: Shannon Simpson MRN: [id number] Date of Birth: 07/02/1961  CM following :              CM following for progression and d/c planning.      Action/Plan: Met with pt and daughter, [facility] and family instructed to call in late February or early March if they are interested in this clinic. Also provided info on [facility], phone number and instructions provided to apply for [facility] Card at that center. Also provided a list of local clinics and practices with low copays and /or sliding scale fees. This was all explained in detail to the daughter who speaks excellent English.   Expected Discharge Date:     07/12/2015             Expected Discharge Plan:  Home/Self Care  In-House Referral:  NA  Discharge planning Services  CM Consult  Post Acute Care Choice:  NA Choice offered to:  NA  DME Arranged:    DME Agency:     HH Arranged:    HH Agency:     Status of Service:  Completed, signed off  Medicare Important Message Given:    Date Medicare IM Given:    Medicare IM give by:    Date Additional Medicare IM Given:    Additional Medicare Important Message give by:     If discussed at Long Length of Stay Meetings, dates discussed:    Additional Comments:  Royal, Cheryl U, RN 07/12/2015, 12:28 PM  

## 2015-07-12 NOTE — Progress Notes (Signed)
Shannon Simpson to be D/C'd Home per MD order.  Discussed prescriptions and follow up appointments with the patient. Prescriptions given to patient, medication list explained in detail. Pt verbalized understanding.    Medication List    STOP taking these medications        DAYQUIL MULTI-SYMPTOM COLD/FLU PO      TAKE these medications        acetaminophen 325 MG tablet  Commonly known as:  TYLENOL  Take 650 mg by mouth every 6 (six) hours as needed (pain).     furosemide 40 MG tablet  Commonly known as:  LASIX  Take 1 tablet (40 mg total) by mouth daily.     lisinopril 10 MG tablet  Commonly known as:  ZESTRIL  Take 1 tablet (10 mg total) by mouth daily.     metoprolol succinate 25 MG 24 hr tablet  Commonly known as:  TOPROL-XL  Take 0.5 tablets (12.5 mg total) by mouth daily.        Filed Vitals:   07/12/15 1000 07/12/15 1129  BP: 171/82 151/84  Pulse: 93   Temp: 98.2 F (36.8 C)   Resp: 18     Skin clean, dry and intact without evidence of skin break down, no evidence of skin tears noted. IV catheter discontinued intact. Site without signs and symptoms of complications. Dressing and pressure applied. Pt denies pain at this time. No complaints noted.  An After Visit Summary was printed and given to the patient. Patient ambulated off unit, and D/C home via private auto.  Janeann ForehandLuke Earvin Blazier BSN, RN

## 2015-07-12 NOTE — Discharge Summary (Signed)
Physician Discharge Summary  Shannon Simpson NWG:[id number] DOB: [birth year] DOA: 07/10/2015  PCP: No primary care provider on file.  Admit date: 07/10/2015 Discharge date: 07/12/2015  Time spent: 45 minutes  Recommendations for Outpatient Follow-up:  Patient will be discharged to home.  Patient will need to follow up with primary care provider within one week of discharge.  Follow up with Dr. Jens Som, cardiologist, on 08/01/2015, repeat BMP.  Patient should continue medications as prescribed.  Patient should follow a heart healthy diet.   Discharge Diagnoses:  Dyspnea on exertion, acute diastolic heart failure Accelerated Hypertension Acute respiratory failure Leukocytosis  Discharge Condition: Stable  Diet recommendation: Heart healthy  Filed Weights   07/11/15 0055 07/11/15 2100 07/12/15 0500  Weight: 80.604 kg (177 lb 11.2 oz) 80.5 kg (177 lb 7.5 oz) 81.466 kg (179 lb 9.6 oz)    History of present illness:  on 07/11/2015 by Dr. Lyda Perone Shannon Simpson is a 54 y.o. female with h/o HTN, dosent see doctor so this has been uncontrolled. Patient presents to the ED with 1 month history of worsening SOB, cough, DOE. No chest pain. Also has BLE edema.  Hospital Course:  Dyspnea on exertion, acute diastolic heart failure -Upon admission BNP 567 -Chest x-ray: Cardiomegaly with mild congestive changes, no focal consolidation -Echocardiogram obtained: EF 50-55%, wall motion was normal, grade 1 diastolic dysfunction -Continue to monitor intake and output, daily weights -Continue Lasix, metoprolol, lisinopril -Weight down 5lbs since admission -Have set up appointment for patient to be see on 08/01/2015 with Dr. Jens Som to establish care -Lipid panel: TC 183, TG 97, HDL 43, LDL 121 -TSH 1.281  Accelerated hypertension -Upon admission, blood pressure 218/122 -Patient takes no blood pressure medications at home -Continue lisinopril, metoprolol, Lasix  Acute  respiratory failure  -likely secondary to the above -Weaned off of Oxygen  Leukocytosis -likely reactive -Resolved  Procedures  Echocardiogram  Consults  None  Discharge Exam: Filed Vitals:   07/12/15 1000 07/12/15 1129  BP: 171/82 151/84  Pulse: 93   Temp: 98.2 F (36.8 C)   Resp: 18      General: Well developed, well nourished, NAD, appears stated age  HEENT: NCAT, mucous membranes moist.  Cardiovascular: S1 S2 auscultated, no rubs, murmurs or gallops. Regular rate and rhythm.  Respiratory: Clear to auscultation bilaterally with equal chest rise  Abdomen: Soft, obese, nontender, nondistended, + bowel sounds  Extremities: warm dry without cyanosis clubbing or edema  Neuro: AAOx3, nonfocal  Psych: Normal affect and demeanor with intact judgement and insight  Discharge Instructions     Medication List    STOP taking these medications        DAYQUIL MULTI-SYMPTOM COLD/FLU PO      TAKE these medications        acetaminophen 325 MG tablet  Commonly known as:  TYLENOL  Take 650 mg by mouth every 6 (six) hours as needed (pain).     furosemide 40 MG tablet  Commonly known as:  LASIX  Take 1 tablet (40 mg total) by mouth daily.     lisinopril 10 MG tablet  Commonly known as:  ZESTRIL  Take 1 tablet (10 mg total) by mouth daily.     metoprolol succinate 25 MG 24 hr tablet  Commonly known as:  TOPROL-XL  Take 0.5 tablets (12.5 mg total) by mouth daily.       No Known Allergies Follow-up Information    Follow up with Olga Millers, MD On 08/01/2015.   Specialty:  Cardiology  Why:  At 2:30pm, please arrive at 2pm to complete paperwork   Contact information:   [address] [place] [address] [phone]       Follow up with Primary care physician . Schedule an appointment as soon as possible for a visit in 1 week.   Why:  Establish care and hospital follow up       The results of significant diagnostics from this  hospitalization (including imaging, microbiology, ancillary and laboratory) are listed below for reference.    Significant Diagnostic Studies: Dg Chest 2 View  07/10/2015  CLINICAL DATA:  54 year old female with shortness of breath EXAM: CHEST  2 VIEW COMPARISON:  Chest CT 11/16/2013 FINDINGS: Two views of the chest demonstrate cardiomegaly with increased interstitial prominence may represent a degree of congestive changes. There is no focal consolidation, pleural effusion, or pneumothorax. Hilar calcified lymph nodes compatible with old granuloma. The osseous structures appear unremarkable. IMPRESSION: Cardiomegaly with mild congestive changes.  No focal consolidation. Electronically Signed   By: Elgie CollardArash  Radparvar M.D.   On: 07/10/2015 18:13    Microbiology: No results found for this or any previous visit (from the past 240 hour(s)).   Labs: Basic Metabolic Panel:  Recent Labs Lab 07/10/15 1732 07/12/15 0547  NA 141 140  K 3.7 3.6  CL 104 101  CO2 26 28  GLUCOSE 132* 109*  BUN 12 13  CREATININE 0.60 0.67  CALCIUM 8.8* 8.9   Liver Function Tests: No results for input(s): AST, ALT, ALKPHOS, BILITOT, PROT, ALBUMIN in the last 168 hours. No results for input(s): LIPASE, AMYLASE in the last 168 hours. No results for input(s): AMMONIA in the last 168 hours. CBC:  Recent Labs Lab 07/10/15 1732 07/12/15 0547  WBC 12.2* 10.4  HGB 12.8 12.9  HCT 38.9 38.7  MCV 66.7* 66.0*  PLT 250 230   Cardiac Enzymes: No results for input(s): CKTOTAL, CKMB, CKMBINDEX, TROPONINI in the last 168 hours. BNP: BNP (last 3 results)  Recent Labs  07/10/15 2228  BNP 567.3*    ProBNP (last 3 results) No results for input(s): PROBNP in the last 8760 hours.  CBG: No results for input(s): GLUCAP in the last 168 hours.     SignedEdsel Petrin:  Sheri Gatchel  [facility] 07/12/2015, 11:32 AM

## 2015-07-12 NOTE — Discharge Instructions (Signed)
Heart Failure  Heart failure means your heart has trouble pumping blood. This makes it hard for your body to work well. Heart failure is usually a long-term (chronic) condition. You must take good care of yourself and follow your doctor's treatment plan.  HOME CARE   Take your heart medicine as told by your doctor.    Do not stop taking medicine unless your doctor tells you to.    Do not skip any dose of medicine.    Refill your medicines before they run out.    Take other medicines only as told by your doctor or pharmacist.   Stay active if told by your doctor. The elderly and people with severe heart failure should talk with a doctor about physical activity.   Eat heart-healthy foods. Choose foods that are without trans fat and are low in saturated fat, cholesterol, and salt (sodium). This includes fresh or frozen fruits and vegetables, fish, lean meats, fat-free or low-fat dairy foods, whole grains, and high-fiber foods. Lentils and dried peas and beans (legumes) are also good choices.   Limit salt if told by your doctor.   Cook in a healthy way. Roast, grill, broil, bake, poach, steam, or stir-fry foods.   Limit fluids as told by your doctor.   Weigh yourself every morning. Do this after you pee (urinate) and before you eat breakfast. Write down your weight to give to your doctor.   Take your blood pressure and write it down if your doctor tells you to.   Ask your doctor how to check your pulse. Check your pulse as told.   Lose weight if told by your doctor.   Stop smoking or chewing tobacco. Do not use gum or patches that help you quit without your doctor's approval.   Schedule and go to doctor visits as told.   Nonpregnant women should have no more than 1 drink a day. Men should have no more than 2 drinks a day. Talk to your doctor about drinking alcohol.   Stop illegal drug use.   Stay current with shots (immunizations).   Manage your health conditions as told by your doctor.   Learn to  manage your stress.   Rest when you are tired.   If it is really hot outside:    Avoid intense activities.    Use air conditioning or fans, or get in a cooler place.    Avoid caffeine and alcohol.    Wear loose-fitting, lightweight, and light-colored clothing.   If it is really cold outside:    Avoid intense activities.    Layer your clothing.    Wear mittens or gloves, a hat, and a scarf when going outside.    Avoid alcohol.   Learn about heart failure and get support as needed.   Get help to maintain or improve your quality of life and your ability to care for yourself as needed.  GET HELP IF:    You gain weight quickly.   You are more short of breath than usual.   You cannot do your normal activities.   You tire easily.   You cough more than normal, especially with activity.   You have any or more puffiness (swelling) in areas such as your hands, feet, ankles, or belly (abdomen).   You cannot sleep because it is hard to breathe.   You feel like your heart is beating fast (palpitations).   You get dizzy or light-headed when you stand up.  GET HELP   RIGHT AWAY IF:    You have trouble breathing.   There is a change in mental status, such as becoming less alert or not being able to focus.   You have chest pain or discomfort.   You faint.  MAKE SURE YOU:    Understand these instructions.   Will watch your condition.   Will get help right away if you are not doing well or get worse.     This information is not intended to replace advice given to you by your health care provider. Make sure you discuss any questions you have with your health care provider.     Document Released: 04/09/2008 Document Revised: 07/22/2014 Document Reviewed: 08/17/2012  Elsevier Interactive Patient Education 2016 [employer]

## 2015-07-31 NOTE — Progress Notes (Signed)
     HPI: 55 year old female for evaluation of congestive heart failure. Echocardiogram 07/11/2015 showed ejection fraction 50-55%, grade 1 diastolic dysfunction, mild aortic and mitral regurgitation, mild left atrial enlargement and mildly elevated pulmonary pressures. Small pericardial effusion. Patient was admitted in December 2016 with hypertension and acute diastolic congestive heart failure. Patient was treated with antihypertensives and diuretics. She improved and was discharged. Note her admission blood pressure was 218/122. Also note patient was microcytic.  Current Outpatient Prescriptions  Medication Sig Dispense Refill  . acetaminophen (TYLENOL) 325 MG tablet Take 650 mg by mouth every 6 (six) hours as needed (pain).    . furosemide (LASIX) 40 MG tablet Take 1 tablet (40 mg total) by mouth daily. 30 tablet 0  . lisinopril (ZESTRIL) 10 MG tablet Take 1 tablet (10 mg total) by mouth daily. 30 tablet 0  . metoprolol succinate (TOPROL-XL) 25 MG 24 hr tablet Take 0.5 tablets (12.5 mg total) by mouth daily. 60 tablet 0   No current facility-administered medications for this visit.    No Known Allergies   Past Medical History  Diagnosis Date  . Hypertension     Past Surgical History  Procedure Laterality Date  . Appendectomy      Social History   Social History  . Marital Status: Single    Spouse Name: N/A  . Number of Children: N/A  . Years of Education: N/A   Occupational History  . Not on file.   Social History Main Topics  . Smoking status: Never Smoker   . Smokeless tobacco: Not on file  . Alcohol Use: No  . Drug Use: No  . Sexual Activity: Not on file   Other Topics Concern  . Not on file   Social History Narrative    No family history on file.  ROS: no fevers or chills, productive cough, hemoptysis, dysphasia, odynophagia, melena, hematochezia, dysuria, hematuria, rash, seizure activity, orthopnea, PND, pedal edema, claudication. Remaining systems are  negative.  Physical Exam:   There were no vitals taken for this visit.  General:  Well developed/well nourished in NAD Skin warm/dry Patient not depressed No peripheral clubbing Back-normal HEENT-normal/normal eyelids Neck supple/normal carotid upstroke bilaterally; no bruits; no JVD; no thyromegaly chest - CTA/ normal expansion CV - RRR/normal S1 and S2; no murmurs, rubs or gallops;  PMI nondisplaced Abdomen -NT/ND, no HSM, no mass, + bowel sounds, no bruit 2+ femoral pulses, no bruits Ext-no edema, chords, 2+ DP Neuro-grossly nonfocal  ECG 07/10/2015-sinus rhythm, left ventricular hypertrophy with repolarization abnormality, right atrial enlargement.   This encounter was created in error - please disregard.

## 2015-08-01 ENCOUNTER — Encounter: Payer: Self-pay | Admitting: Cardiology
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
## 2015-08-28 ENCOUNTER — Encounter: Payer: Self-pay | Admitting: Cardiology
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
## 2015-09-28 ENCOUNTER — Encounter: Payer: Self-pay | Admitting: Cardiology

## 2015-10-03 ENCOUNTER — Ambulatory Visit (INDEPENDENT_AMBULATORY_CARE_PROVIDER_SITE_OTHER): Payer: Self-pay | Admitting: Cardiology

## 2015-10-03 ENCOUNTER — Encounter: Payer: Self-pay | Admitting: Cardiology

## 2015-10-03 VITALS — BP 164/110 | HR 88 | Ht 62.0 in | Wt 182.0 lb

## 2015-10-03 DIAGNOSIS — R718 Other abnormality of red blood cells: Secondary | ICD-10-CM | POA: Insufficient documentation

## 2015-10-03 DIAGNOSIS — I10 Essential (primary) hypertension: Secondary | ICD-10-CM

## 2015-10-03 DIAGNOSIS — I509 Heart failure, unspecified: Secondary | ICD-10-CM | POA: Insufficient documentation

## 2015-10-03 MED ORDER — LISINOPRIL 10 MG PO TABS: 10.0000 mg | ORAL_TABLET | Freq: Every day | ORAL | Status: DC

## 2015-10-03 MED ORDER — METOPROLOL SUCCINATE ER 25 MG PO TB24: 25.0000 mg | ORAL_TABLET | Freq: Every day | ORAL | Status: DC

## 2015-10-03 MED ORDER — FUROSEMIDE 40 MG PO TABS: 40.0000 mg | ORAL_TABLET | Freq: Every day | ORAL | Status: DC

## 2015-10-03 NOTE — Progress Notes (Signed)
     HPI: 55 year old female for evaluation of congestive heart failure. Echocardiogram December 2016 showed normal LV function, mild aortic and mitral regurgitation and mild left atrial enlargement. Small pericardial effusion. MCV December 2016 66. Patient admitted in December 2016 with diastolic congestive heart failure.She was treated with Lasix and blood pressure medications with improvement. Blood pressure was elevated but she was not taking medications. She improved following discharge. However over the past 2 weeks she ran out of her blood pressure medications. She has some dyspnea on exertion but no orthopnea, PND, pedal edema, palpitations, syncope or chest pain.  Current Outpatient Prescriptions  Medication Sig Dispense Refill  . acetaminophen (TYLENOL) 325 MG tablet Take 650 mg by mouth every 6 (six) hours as needed (pain).    . furosemide (LASIX) 40 MG tablet Take 1 tablet (40 mg total) by mouth daily. 30 tablet 0  . lisinopril (ZESTRIL) 10 MG tablet Take 1 tablet (10 mg total) by mouth daily. 30 tablet 0  . metoprolol succinate (TOPROL-XL) 25 MG 24 hr tablet Take 0.5 tablets (12.5 mg total) by mouth daily. 60 tablet 0   No current facility-administered medications for this visit.    No Known Allergies   Past Medical History  Diagnosis Date  . Hypertension   . CHF (congestive heart failure) [facility](HCC)     Past Surgical History  Procedure Laterality Date  . Appendectomy      Social History   Social History  . Marital Status: Single    Spouse Name: N/A  . Number of Children: 3  . Years of Education: N/A   Occupational History  . Not on file.   Social History Main Topics  . Smoking status: Never Smoker   . Smokeless tobacco: Not on file  . Alcohol Use: No  . Drug Use: No  . Sexual Activity: Not on file   Other Topics Concern  . Not on file   Social History Narrative    Family History  Problem Relation Age of Onset  . Heart disease      No family history     ROS: No headaches, no fevers or chills, productive cough, hemoptysis, dysphasia, odynophagia, melena, hematochezia, dysuria, hematuria, rash, seizure activity, orthopnea, PND, pedal edema, claudication. Remaining systems are negative.  Physical Exam:   Blood pressure 164/120, pulse 88, height 5\' 2"  (1.575 m), weight 182 lb (82.555 kg).  General:  Well developed/well nourished in NAD Skin warm/dry Patient not depressed No peripheral clubbing Back-normal HEENT-normal/normal eyelids Neck supple/normal carotid upstroke bilaterally; no bruits; no JVD; no thyromegaly chest - CTA/ normal expansion CV - RRR/normal S1 and S2; no murmurs, rubs or gallops;  PMI nondisplaced Abdomen -NT/ND, no HSM, no mass, + bowel sounds, no bruit 2+ femoral pulses, no bruits Ext-trace edema, no chords, 2+ DP Neuro-grossly nonfocal  ECG 07/10/2015-sinus rhythm, left ventricular hypertrophy with repolarization abnormality, prolonged QT interval.

## 2015-10-03 NOTE — Patient Instructions (Signed)
Medication Instructions:   METOPROLOL 25 MG ONCE DAILY  LISINOPRIL 10 MG ONCE DAILY  FUROSEMIDE 40 MG ONCE DAILY  Labwork:  Your physician recommends that you return for lab work in:ONE WEEK  Follow-Up:  Your physician recommends that you schedule a follow-up appointment in: Thursday OR Friday THIS WEEK WITH APP  Your physician recommends that you schedule a follow-up appointment in: 8 WEEKS WITH DR CRENSHAW    REFERRAL TO PRIMARY CARE MD

## 2015-10-03 NOTE — Assessment & Plan Note (Signed)
Blood pressure is elevated. However she has not taken any of her medications. Resume Toprol 25 mg daily, lisinopril 10 mg daily, and Lasix 40 mg daily. Follow blood pressure and adjust regimen as needed. I explained the importance of compliance with medications. I have explained the risk of untreated hypertension including CVA, myocardial infarction, renal dysfunction.

## 2015-10-03 NOTE — Assessment & Plan Note (Signed)
Noted in the hospital. We will refer to primary care for further evaluation.

## 2015-10-03 NOTE — Assessment & Plan Note (Signed)
Patient recently admitted with acute diastolic congestive heart failure. Her symptoms have improved. However she has run out of her medications and her blood pressure is elevated. She describes some dyspnea on exertion and there is trace pedal edema. Resume Lasix 40 mg daily. Check potassium and renal function in 1 week.

## 2015-10-06 ENCOUNTER — Encounter: Payer: Self-pay | Admitting: Cardiology

## 2015-10-06 ENCOUNTER — Ambulatory Visit (INDEPENDENT_AMBULATORY_CARE_PROVIDER_SITE_OTHER): Payer: Self-pay | Admitting: Cardiology

## 2015-10-06 VITALS — BP 180/116 | HR 86 | Ht 62.0 in | Wt 183.1 lb

## 2015-10-06 DIAGNOSIS — E669 Obesity, unspecified: Secondary | ICD-10-CM

## 2015-10-06 DIAGNOSIS — I10 Essential (primary) hypertension: Secondary | ICD-10-CM

## 2015-10-06 DIAGNOSIS — I119 Hypertensive heart disease without heart failure: Secondary | ICD-10-CM

## 2015-10-06 MED ORDER — LISINOPRIL 20 MG PO TABS: 20.0000 mg | ORAL_TABLET | Freq: Every day | ORAL | Status: DC

## 2015-10-06 NOTE — Patient Instructions (Signed)
Increase Lisinopril to 20 mg daily   Schedule Blood Pressure check in 1 week with nurse   Franky MachoLuke to be notified

## 2015-10-06 NOTE — Assessment & Plan Note (Signed)
Moderate LVH, grade 1 DD, normal LVF Nov 2016

## 2015-10-06 NOTE — Assessment & Plan Note (Signed)
Estimated body mass index is 33.58 kg/m as calculated from the following:   Height as of this encounter: 5' (1.524 m).   Weight as of this encounter: 78 kg.   -This will complicate overall prognosis 

## 2015-10-06 NOTE — Assessment & Plan Note (Signed)
Sub optimal control 

## 2015-10-06 NOTE — Progress Notes (Signed)
    10/06/2015 Maddisen Paczkowski   1961/05/04  [id number]  Primary Physician No primary care provider on file. Primary Cardiologist: Dr Jens Somrenshaw  HPI:  55 year old obese female admitted in December 2016 with diastolic congestive heart failure. She was treated with Lasix and blood pressure medications with improvement. She has been noncompliant and on no medication previously. She improved following discharge. However over the past few weeks she ran out of her blood pressure medications. She was seen by Dr Jens Somrenshaw 10/03/15 and her medications resumed. She is in the office today for follow up. Her B/P remains elevated. Symptomatically she is doing well.    Current Outpatient Prescriptions  Medication Sig Dispense Refill  . acetaminophen (TYLENOL) 325 MG tablet Take 650 mg by mouth every 6 (six) hours as needed (pain).    . furosemide (LASIX) 40 MG tablet Take 1 tablet (40 mg total) by mouth daily. 30 tablet 6  . metoprolol succinate (TOPROL-XL) 25 MG 24 hr tablet Take 1 tablet (25 mg total) by mouth daily. 60 tablet 6  . lisinopril (PRINIVIL,ZESTRIL) 20 MG tablet Take 1 tablet (20 mg total) by mouth daily. 30 tablet 6   No current facility-administered medications for this visit.    No Known Allergies  Social History   Social History  . Marital Status: Single    Spouse Name: N/A  . Number of Children: 3  . Years of Education: N/A   Occupational History  . Not on file.   Social History Main Topics  . Smoking status: Never Smoker   . Smokeless tobacco: Not on file  . Alcohol Use: No  . Drug Use: No  . Sexual Activity: Not on file   Other Topics Concern  . Not on file   Social History Narrative     Review of Systems: General: negative for chills, fever, night sweats or weight changes.  Cardiovascular: negative for chest pain, dyspnea on exertion, edema, orthopnea, palpitations, paroxysmal nocturnal dyspnea or shortness of breath Dermatological: negative for  rash Respiratory: negative for cough or wheezing Urologic: negative for hematuria Abdominal: negative for nausea, vomiting, diarrhea, bright red blood per rectum, melena, or hematemesis Neurologic: negative for visual changes, syncope, or dizziness All other systems reviewed and are otherwise negative except as noted above.    Blood pressure 180/116, pulse 86, height 5\' 2"  (1.575 m), weight 183 lb 1.6 oz (83.054 kg).  General appearance: alert, cooperative, no distress and moderately obese Neck: no carotid bruit and no JVD Lungs: clear to auscultation bilaterally Heart: regular rate and rhythm Extremities: no edema Neurologic: Grossly normal  EKG NSR, LVH  ASSESSMENT AND PLAN:   Essential hypertension Sub optimal control  Obesity  BMI 33  Hypertensive cardiovascular disease Moderate LVH, grade 1 DD, normal LVF Nov 2016   PLAN  I increased her Lisinopril to 20 mg daily. I discussed (thorough her daughter who accompanied the patient) that she needs to change her diet- less salt, less fatty foods. She needs a BMP and a B/P check with an RN next week.    Jaken Fregia K PA-C 10/06/2015 11:13 AM

## 2015-10-13 ENCOUNTER — Ambulatory Visit (INDEPENDENT_AMBULATORY_CARE_PROVIDER_SITE_OTHER): Payer: Self-pay | Admitting: *Deleted

## 2015-10-13 VITALS — BP 166/80

## 2015-10-13 DIAGNOSIS — I10 Essential (primary) hypertension: Secondary | ICD-10-CM

## 2015-10-13 MED ORDER — AMLODIPINE BESYLATE 5 MG PO TABS: 5.0000 mg | ORAL_TABLET | Freq: Every day | ORAL | Status: DC

## 2015-10-13 NOTE — Progress Notes (Signed)
Patient came in for BP recheck after med changes by Corine ShelterLuke Kilroy last week. She notes she has been having dry cough for past week. Discussed w/ Belenda CruiseKristin who recommended to switch from lisinopril 20mg  daily to amlodipine 5mg  daily & to follow up in 2-3 weeks for BP visit w/ her. These instructions relayed to patient and daughter, who verbalized understanding.

## 2015-10-13 NOTE — Patient Instructions (Signed)
Stop taking lisinopril.  Start amlodipine 5mg  every evening. May take 1st dose tonight.  Return in 2-3 weeks for blood pressure check w/ Belenda CruiseKristin.

## 2015-11-02 ENCOUNTER — Ambulatory Visit: Payer: Self-pay | Admitting: Pharmacist Clinician (PhC)/ Clinical Pharmacy Specialist

## 2015-11-22 NOTE — Progress Notes (Signed)
      HPI: FU congestive heart failure. Echocardiogram December 2016 showed normal LV function, mild aortic and mitral regurgitation and mild left atrial enlargement. Small pericardial effusion. MCV December 2016 66. Patient admitted in December 2016 with diastolic congestive heart failure.She was treated with Lasix and blood pressure medications with improvement. Blood pressure was elevated but she was not taking medications. She was not taking her medications when I saw her in March and we resumed. Since last seen  Current Outpatient Prescriptions  Medication Sig Dispense Refill  . acetaminophen (TYLENOL) 325 MG tablet Take 650 mg by mouth every 6 (six) hours as needed (pain).    Marland Kitchen. amLODipine (NORVASC) 5 MG tablet Take 1 tablet (5 mg total) by mouth daily. 30 tablet 3  . furosemide (LASIX) 40 MG tablet Take 1 tablet (40 mg total) by mouth daily. 30 tablet 6  . metoprolol succinate (TOPROL-XL) 25 MG 24 hr tablet Take 1 tablet (25 mg total) by mouth daily. 60 tablet 6   No current facility-administered medications for this visit.     Past Medical History  Diagnosis Date  . Hypertension   . CHF (congestive heart failure) [facility](HCC)     Past Surgical History  Procedure Laterality Date  . Appendectomy      Social History   Social History  . Marital Status: Single    Spouse Name: N/A  . Number of Children: 3  . Years of Education: N/A   Occupational History  . Not on file.   Social History Main Topics  . Smoking status: Never Smoker   . Smokeless tobacco: Not on file  . Alcohol Use: No  . Drug Use: No  . Sexual Activity: Not on file   Other Topics Concern  . Not on file   Social History Narrative    Family History  Problem Relation Age of Onset  . Heart disease      No family history    ROS: no fevers or chills, productive cough, hemoptysis, dysphasia, odynophagia, melena, hematochezia, dysuria, hematuria, rash, seizure activity, orthopnea, PND, pedal edema,  claudication. Remaining systems are negative.  Physical Exam: Well-developed well-nourished in no acute distress.  Skin is warm and dry.  HEENT is normal.  Neck is supple.  Chest is clear to auscultation with normal expansion.  Cardiovascular exam is regular rate and rhythm.  Abdominal exam nontender or distended. No masses palpated. Extremities show no edema. neuro grossly intact  ECG     This encounter was created in error - please disregard.

## 2015-11-30 ENCOUNTER — Encounter: Payer: Self-pay | Admitting: Cardiology

## 2015-12-05 ENCOUNTER — Encounter: Payer: Self-pay | Admitting: *Deleted

## 2015-12-18 ENCOUNTER — Ambulatory Visit: Payer: Self-pay | Admitting: Internal Medicine

## 2015-12-18 DIAGNOSIS — Z0289 Encounter for other administrative examinations: Secondary | ICD-10-CM

## 2017-08-15 DIAGNOSIS — R6510 Systemic inflammatory response syndrome (SIRS) of non-infectious origin without acute organ dysfunction: Secondary | ICD-10-CM

## 2017-08-15 DIAGNOSIS — J9691 Respiratory failure, unspecified with hypoxia: Secondary | ICD-10-CM

## 2017-08-15 HISTORY — DX: Systemic inflammatory response syndrome (sirs) of non-infectious origin without acute organ dysfunction: R65.10

## 2017-08-15 HISTORY — DX: Respiratory failure, unspecified with hypoxia: J96.91

## 2017-09-04 ENCOUNTER — Inpatient Hospital Stay (HOSPITAL_COMMUNITY)
Admission: EM | Admit: 2017-09-04 | Discharge: 2017-09-06 | DRG: 193 | Disposition: A | Discharge status: Home / Self Care | Payer: Self-pay | Attending: Internal Medicine | Admitting: Internal Medicine

## 2017-09-04 ENCOUNTER — Other Ambulatory Visit: Payer: Self-pay

## 2017-09-04 ENCOUNTER — Encounter (HOSPITAL_COMMUNITY): Payer: Self-pay | Admitting: Emergency Medicine

## 2017-09-04 ENCOUNTER — Emergency Department (HOSPITAL_COMMUNITY): Payer: Self-pay

## 2017-09-04 DIAGNOSIS — I272 Pulmonary hypertension, unspecified: Secondary | ICD-10-CM | POA: Diagnosis present

## 2017-09-04 DIAGNOSIS — I110 Hypertensive heart disease with heart failure: Secondary | ICD-10-CM | POA: Diagnosis present

## 2017-09-04 DIAGNOSIS — I152 Hypertension secondary to endocrine disorders: Secondary | ICD-10-CM | POA: Diagnosis present

## 2017-09-04 DIAGNOSIS — E876 Hypokalemia: Secondary | ICD-10-CM | POA: Diagnosis present

## 2017-09-04 DIAGNOSIS — R509 Fever, unspecified: Secondary | ICD-10-CM

## 2017-09-04 DIAGNOSIS — E6609 Other obesity due to excess calories: Secondary | ICD-10-CM | POA: Diagnosis present

## 2017-09-04 DIAGNOSIS — J101 Influenza due to other identified influenza virus with other respiratory manifestations: Principal | ICD-10-CM | POA: Diagnosis present

## 2017-09-04 DIAGNOSIS — Z9119 Patient's noncompliance with other medical treatment and regimen: Secondary | ICD-10-CM

## 2017-09-04 DIAGNOSIS — J9601 Acute respiratory failure with hypoxia: Secondary | ICD-10-CM | POA: Diagnosis present

## 2017-09-04 DIAGNOSIS — D6959 Other secondary thrombocytopenia: Secondary | ICD-10-CM | POA: Diagnosis present

## 2017-09-04 DIAGNOSIS — I10 Essential (primary) hypertension: Secondary | ICD-10-CM

## 2017-09-04 DIAGNOSIS — D509 Iron deficiency anemia, unspecified: Secondary | ICD-10-CM | POA: Diagnosis present

## 2017-09-04 DIAGNOSIS — R6510 Systemic inflammatory response syndrome (SIRS) of non-infectious origin without acute organ dysfunction: Secondary | ICD-10-CM | POA: Diagnosis present

## 2017-09-04 DIAGNOSIS — Z6832 Body mass index (BMI) 32.0-32.9, adult: Secondary | ICD-10-CM

## 2017-09-04 DIAGNOSIS — I5032 Chronic diastolic (congestive) heart failure: Secondary | ICD-10-CM | POA: Diagnosis present

## 2017-09-04 DIAGNOSIS — R197 Diarrhea, unspecified: Secondary | ICD-10-CM | POA: Diagnosis present

## 2017-09-04 DIAGNOSIS — R0902 Hypoxemia: Secondary | ICD-10-CM

## 2017-09-04 HISTORY — DX: Chronic diastolic (congestive) heart failure: I50.32

## 2017-09-04 HISTORY — DX: Systemic inflammatory response syndrome (sirs) of non-infectious origin without acute organ dysfunction: R65.10

## 2017-09-04 HISTORY — DX: Iron deficiency anemia, unspecified: D50.9

## 2017-09-04 HISTORY — DX: Pulmonary hypertension, unspecified: I27.20

## 2017-09-04 HISTORY — DX: Obesity, class 1: E66.811

## 2017-09-04 HISTORY — DX: Body mass index (BMI) 32.0-32.9, adult: Z68.32

## 2017-09-04 HISTORY — DX: Respiratory failure, unspecified with hypoxia: J96.91

## 2017-09-04 HISTORY — DX: Obesity, unspecified: E66.9

## 2017-09-04 LAB — CBC WITH DIFFERENTIAL/PLATELET
BASOS PCT: 0 %
Basophils Absolute: 0 10*3/uL (ref 0.0–0.1)
EOS ABS: 0.1 10*3/uL (ref 0.0–0.7)
Eosinophils Relative: 1 %
HCT: 36.1 % (ref 36.0–46.0)
HEMOGLOBIN: 12.2 g/dL (ref 12.0–15.0)
LYMPHS PCT: 20 %
Lymphs Abs: 2.3 10*3/uL (ref 0.7–4.0)
MCH: 21.2 pg — AB (ref 26.0–34.0)
MCHC: 33.8 g/dL (ref 30.0–36.0)
MCV: 62.7 fL — ABNORMAL LOW (ref 78.0–100.0)
MONO ABS: 0.7 10*3/uL (ref 0.1–1.0)
Monocytes Relative: 6 %
NEUTROS ABS: 8.6 10*3/uL — AB (ref 1.7–7.7)
NEUTROS PCT: 73 %
PLATELETS: 172 10*3/uL (ref 150–400)
RBC: 5.76 MIL/uL — ABNORMAL HIGH (ref 3.87–5.11)
RDW: 16.2 % — ABNORMAL HIGH (ref 11.5–15.5)
WBC: 11.7 10*3/uL — ABNORMAL HIGH (ref 4.0–10.5)

## 2017-09-04 LAB — COMPREHENSIVE METABOLIC PANEL
ALT: 24 U/L (ref 14–54)
ANION GAP: 12 (ref 5–15)
AST: 32 U/L (ref 15–41)
Albumin: 3.4 g/dL — ABNORMAL LOW (ref 3.5–5.0)
Alkaline Phosphatase: 84 U/L (ref 38–126)
BILIRUBIN TOTAL: 0.7 mg/dL (ref 0.3–1.2)
BUN: 14 mg/dL (ref 6–20)
CO2: 23 mmol/L (ref 22–32)
Calcium: 8.7 mg/dL — ABNORMAL LOW (ref 8.9–10.3)
Chloride: 100 mmol/L — ABNORMAL LOW (ref 101–111)
Creatinine, Ser: 0.66 mg/dL (ref 0.44–1.00)
GFR calc Af Amer: >60 mL/min (ref >60)
GLUCOSE: 184 mg/dL — AB (ref 65–99)
POTASSIUM: 3.4 mmol/L — AB (ref 3.5–5.1)
Sodium: 135 mmol/L (ref 135–145)
TOTAL PROTEIN: 7.4 g/dL (ref 6.5–8.1)

## 2017-09-04 LAB — I-STAT CG4 LACTIC ACID, ED
LACTIC ACID, VENOUS: 1.24 mmol/L (ref 0.5–1.9)
Lactic Acid, Venous: 1.41 mmol/L (ref 0.5–1.9)

## 2017-09-04 LAB — URINALYSIS, ROUTINE W REFLEX MICROSCOPIC
BACTERIA UA: NONE SEEN
Bilirubin Urine: NEGATIVE
GLUCOSE, UA: NEGATIVE mg/dL
Ketones, ur: NEGATIVE mg/dL
Leukocytes, UA: NEGATIVE
Nitrite: NEGATIVE
PH: 5 (ref 5.0–8.0)
Protein, ur: 100 mg/dL — AB
SPECIFIC GRAVITY, URINE: 1.017 (ref 1.005–1.030)

## 2017-09-04 LAB — INFLUENZA PANEL BY PCR (TYPE A & B)
Influenza A By PCR: POSITIVE — AB
Influenza B By PCR: NEGATIVE

## 2017-09-04 LAB — I-STAT BETA HCG BLOOD, ED (MC, WL, AP ONLY): I-stat hCG, quantitative: <5 m[IU]/mL (ref <5)

## 2017-09-04 LAB — HIV ANTIBODY (ROUTINE TESTING W REFLEX): HIV SCREEN 4TH GENERATION: NONREACTIVE

## 2017-09-04 LAB — PROTIME-INR
INR: 1.13
Prothrombin Time: 14.4 seconds (ref 11.4–15.2)

## 2017-09-04 LAB — PROCALCITONIN: PROCALCITONIN: 0.89 ng/mL

## 2017-09-04 MED ORDER — PHENOL 1.4 % MT LIQD
1.0000 | OROMUCOSAL | Status: DC | PRN
  Administered 2017-09-04: 1 via OROMUCOSAL
  Filled 2017-09-04: qty 177

## 2017-09-04 MED ORDER — ONDANSETRON HCL 4 MG PO TABS: 4.0000 mg | ORAL_TABLET | Freq: Four times a day (QID) | ORAL | Status: DC | PRN

## 2017-09-04 MED ORDER — METOPROLOL TARTRATE 25 MG PO TABS
25.0000 mg | ORAL_TABLET | Freq: Two times a day (BID) | ORAL | Status: DC
  Administered 2017-09-04 – 2017-09-06 (×5): 25 mg via ORAL
  Filled 2017-09-04 (×5): qty 1

## 2017-09-04 MED ORDER — ACETAMINOPHEN 650 MG RE SUPP: 650.0000 mg | Freq: Four times a day (QID) | RECTAL | Status: DC | PRN

## 2017-09-04 MED ORDER — SODIUM CHLORIDE 0.9 % IV SOLN
1.0000 g | Freq: Once | INTRAVENOUS | Status: AC
  Administered 2017-09-04: 1 g via INTRAVENOUS
  Filled 2017-09-04: qty 10

## 2017-09-04 MED ORDER — OSELTAMIVIR PHOSPHATE 75 MG PO CAPS
75.0000 mg | ORAL_CAPSULE | Freq: Two times a day (BID) | ORAL | Status: DC
  Administered 2017-09-04 – 2017-09-06 (×5): 75 mg via ORAL
  Filled 2017-09-04 (×5): qty 1

## 2017-09-04 MED ORDER — ENOXAPARIN SODIUM 40 MG/0.4ML ~~LOC~~ SOLN
40.0000 mg | SUBCUTANEOUS | Status: DC
  Administered 2017-09-04 – 2017-09-05 (×2): 40 mg via SUBCUTANEOUS
  Filled 2017-09-04 (×2): qty 0.4

## 2017-09-04 MED ORDER — HYDRALAZINE HCL 20 MG/ML IJ SOLN
10.0000 mg | Freq: Four times a day (QID) | INTRAMUSCULAR | Status: DC | PRN
  Administered 2017-09-04 – 2017-09-06 (×3): 10 mg via INTRAVENOUS
  Filled 2017-09-04 (×3): qty 1

## 2017-09-04 MED ORDER — SODIUM CHLORIDE 0.9 % IV SOLN
INTRAVENOUS | Status: DC
  Administered 2017-09-04 – 2017-09-05 (×2): via INTRAVENOUS

## 2017-09-04 MED ORDER — ACETAMINOPHEN 325 MG PO TABS
650.0000 mg | ORAL_TABLET | Freq: Once | ORAL | Status: AC
  Administered 2017-09-04: 650 mg via ORAL
  Filled 2017-09-04: qty 2

## 2017-09-04 MED ORDER — AZITHROMYCIN 500 MG IV SOLR
500.0000 mg | Freq: Once | INTRAVENOUS | Status: AC
  Administered 2017-09-04: 500 mg via INTRAVENOUS
  Filled 2017-09-04: qty 500

## 2017-09-04 MED ORDER — SODIUM CHLORIDE 0.9 % IV BOLUS (SEPSIS)
1000.0000 mL | Freq: Once | INTRAVENOUS | Status: AC
  Administered 2017-09-04: 1000 mL via INTRAVENOUS

## 2017-09-04 MED ORDER — ONDANSETRON HCL 4 MG/2ML IJ SOLN: 4.0000 mg | Freq: Four times a day (QID) | INTRAMUSCULAR | Status: DC | PRN

## 2017-09-04 MED ORDER — GUAIFENESIN-DM 100-10 MG/5ML PO SYRP
5.0000 mL | ORAL_SOLUTION | ORAL | Status: DC | PRN
  Administered 2017-09-04 – 2017-09-05 (×3): 5 mL via ORAL
  Filled 2017-09-04 (×3): qty 5

## 2017-09-04 MED ORDER — SODIUM CHLORIDE 0.9 % IV SOLN
1.0000 g | INTRAVENOUS | Status: DC
  Administered 2017-09-05: 1 g via INTRAVENOUS
  Filled 2017-09-04: qty 10

## 2017-09-04 MED ORDER — ACETAMINOPHEN 325 MG PO TABS: 650.0000 mg | ORAL_TABLET | Freq: Four times a day (QID) | ORAL | Status: DC | PRN

## 2017-09-04 MED ORDER — DIPHENOXYLATE-ATROPINE 2.5-0.025 MG/5ML PO LIQD
5.0000 mL | Freq: Four times a day (QID) | ORAL | Status: DC | PRN
  Administered 2017-09-04: 5 mL via ORAL
  Filled 2017-09-04: qty 5

## 2017-09-04 MED ORDER — SODIUM CHLORIDE 0.9 % IV SOLN
500.0000 mg | INTRAVENOUS | Status: DC
  Administered 2017-09-05: 500 mg via INTRAVENOUS
  Filled 2017-09-04: qty 500

## 2017-09-04 MED ORDER — IPRATROPIUM-ALBUTEROL 0.5-2.5 (3) MG/3ML IN SOLN: 3.0000 mL | RESPIRATORY_TRACT | Status: DC | PRN

## 2017-09-04 NOTE — Progress Notes (Signed)
Patient arrived to 6n26 alert and oriented, VSS BP high, will medicate and recheck, and o2 level 88% on room air so applied 2L of oxygen and came up to 94%. IV fluids infusing, able to ambulate with little assistance to bathroom. Daughter at bedside, oriented to room and staff. Will continue to monitor. Patient on droplet precautions.

## 2017-09-04 NOTE — Progress Notes (Signed)
Pt had loose stools more than 10 x today, Sheralyn BoatmanEllis, Alison NP notified. Will give pt Lomotil.

## 2017-09-04 NOTE — H&P (Addendum)
History and Physical    Shannon Simpson ZOX:[id number] DOB: [birth year] DOA: 09/04/2017  **Will admit patient based on the expectation that the patient will need hospitalization/ hospital care that crosses at least 2 midnights  PCP: Patient, No Pcp Per Gentry Fitz  Attending physician: Willette Pa  Patient coming from/Resides with: Private residence/family  Chief Complaint: Shortness of breath and cough  HPI: Shannon Simpson is a 57 y.o. female with medical history significant for obesity, hypertension, chronic diastolic heart failure with mild pulmonary hypertension who presented to the ER after reporting 2 days of fever greater than 103, productive cough with yellow sputum, shortness of breath nausea and chills.  Chest x-ray in the ER without focal infiltrate.  Influenza panel pending at time of admission.  EDP has given empiric antibiotics for CAP.  Serum lactate normal.  Room air saturations 84%.  Urinalysis unremarkable.  Blood cultures obtained in the ER.  ED Course:  Vital Signs: BP 106/90   Pulse (!) 107   Temp (!) 101.6 F (38.7 C) (Oral)   Resp (!) 22   Ht 5\' 2"  (1.575 m)   Wt 81.6 kg (180 lb)   SpO2 96%   BMI 32.92 kg/m  CXR: Stable chronic appearance of pulmonary vascular congestion.  No focal consolidation Lab data: Sodium 135, potassium 3.4, chloride 100, CO2 23, glucose 184, BUN 14, creatinine 0.66, calcium 8.7, LFTs normal, lactic acid 1.41, white count 11,700 with neutrophils 73% and absolute neutrophils 8.6%, hemoglobin 12.2 with MCV 62.7, platelets 172,000, urinalysis unremarkable and not consistent with UTI, pregnancy negative, blood cultures x2 Medications and treatments: Tylenol 650 mg x1, normal saline bolus times 2 L, Rocephin 1 g IV x1, Zithromax 500 mg IV x1  Review of Systems:  In addition to the HPI above,  No Headache, changes with Vision or hearing, new weakness, tingling, numbness in any extremity, dizziness, dysarthria or word finding  difficulty, gait disturbance or imbalance, tremors or seizure activity No problems swallowing food or Liquids, indigestion/reflux, choking or coughing while eating, abdominal pain with or after eating No Chest pain, palpitations, orthopnea  No Abdominal pain, emesis, melena,hematochezia, dark tarry stools, constipation No dysuria, malodorous urine, hematuria or flank pain No new skin rashes, lesions, masses or bruises, No new joint pains, aches, swelling or redness No recent unintentional weight gain or loss No polyuria, polydypsia or polyphagia   Past Medical History:  Diagnosis Date  . CHF (congestive heart failure) (HCC)   . Chronic diastolic heart failure (HCC)   . Class 1 obesity with body mass index (BMI) of 32.0 to 32.9 in adult   . Hypertension   . Microcytic anemia   . Mild pulmonary hypertension (HCC)     Past Surgical History:  Procedure Laterality Date  . APPENDECTOMY      Social History   Socioeconomic History  . Marital status: Single    Spouse name: Not on file  . Number of children: 3  . Years of education: Not on file  . Highest education level: Not on file  Social Needs  . Financial resource strain: Not on file  . Food insecurity - worry: Not on file  . Food insecurity - inability: Not on file  . Transportation needs - medical: Not on file  . Transportation needs - non-medical: Not on file  Occupational History  . Not on file  Tobacco Use  . Smoking status: Never Smoker  . Smokeless tobacco: Never Used  Substance and Sexual Activity  . Alcohol use: No  .  Drug use: No  . Sexual activity: Not on file  Other Topics Concern  . Not on file  Social History Narrative  . Not on file    Mobility: Independent Work history: Not obtained   No Known Allergies  Family History  Problem Relation Age of Onset  . Heart disease Unknown        No family history     Prior to Admission medications   Medication Sig Start Date End Date Taking?  Authorizing Provider  amLODipine (NORVASC) 5 MG tablet Take 1 tablet (5 mg total) by mouth daily. Patient not taking: Reported on September 24, 2017 10/13/15   Phillips Hay L, RPH-CPP  furosemide (LASIX) 40 MG tablet Take 1 tablet (40 mg total) by mouth daily. Patient not taking: Reported on 2017-09-24 10/03/15   Lewayne Bunting, MD  metoprolol succinate (TOPROL-XL) 25 MG 24 hr tablet Take 1 tablet (25 mg total) by mouth daily. Patient not taking: Reported on September 24, 2017 10/03/15   Lewayne Bunting, MD    Physical Exam: Vitals:   09/24/17 0500 2017/09/24 0530 09-24-17 0600 September 24, 2017 0700  BP: (!) 161/81 (!) 152/82 (!) 177/97 106/90  Pulse: (!) 101 98 100 (!) 107  Resp: (!) 24 (!) 22  (!) 22  Temp:      TempSrc:      SpO2: 95% 97% 94% 96%  Weight:      Height:          Constitutional: NAD, calm, comfortable Eyes: PERRL, lids and conjunctivae normal ENMT: Mucous membranes are dry. Posterior pharynx clear of any exudate or lesions. Normal dentition.  Neck: normal, supple, no masses, no thyromegaly Respiratory: Hoarse to auscultation with bilateral expiratory wheezing but air movement not affected.. Normal respiratory effort without accessory muscle use. 2 L  Cardiovascular: Regular rate and rhythm, no murmurs / rubs / gallops. No extremity edema. 2+ pedal pulses. No carotid bruits.  Abdomen: no tenderness, no masses palpated. No hepatosplenomegaly. Bowel sounds positive.  Musculoskeletal: no clubbing / cyanosis. No joint deformity upper and lower extremities. Good ROM, no contractures. Normal muscle tone.  Skin: no rashes, lesions, ulcers. No induration Neurologic: CN 2-12 grossly intact. Sensation intact, DTR normal. Strength 5/5 x all 4 extremities.  Psychiatric: Normal judgment and insight. Alert and oriented x 3. Normal mood.  Interview assisted by family member at bedside translating.   Labs on Admission: I have personally reviewed following labs and imaging studies  CBC: Recent Labs    Lab 09-24-17 0211  WBC 11.7*  NEUTROABS 8.6*  HGB 12.2  HCT 36.1  MCV 62.7*  PLT 172   Basic Metabolic Panel: Recent Labs  Lab 09/24/2017 0211  NA 135  K 3.4*  CL 100*  CO2 23  GLUCOSE 184*  BUN 14  CREATININE 0.66  CALCIUM 8.7*   GFR: Estimated Creatinine Clearance: 78.6 mL/min (by C-G formula based on SCr of 0.66 mg/dL). Liver Function Tests: Recent Labs  Lab Sep 24, 2017 0211  AST 32  ALT 24  ALKPHOS 84  BILITOT 0.7  PROT 7.4  ALBUMIN 3.4*   No results for input(s): LIPASE, AMYLASE in the last 168 hours. No results for input(s): AMMONIA in the last 168 hours. Coagulation Profile: Recent Labs  Lab 09/24/17 0211  INR 1.13   Cardiac Enzymes: No results for input(s): CKTOTAL, CKMB, CKMBINDEX, TROPONINI in the last 168 hours. BNP (last 3 results) No results for input(s): PROBNP in the last 8760 hours. HbA1C: No results for input(s): HGBA1C in the last 72 hours.  CBG: No results for input(s): GLUCAP in the last 168 hours. Lipid Profile: No results for input(s): CHOL, HDL, LDLCALC, TRIG, CHOLHDL, LDLDIRECT in the last 72 hours. Thyroid Function Tests: No results for input(s): TSH, T4TOTAL, FREET4, T3FREE, THYROIDAB in the last 72 hours. Anemia Panel: No results for input(s): VITAMINB12, FOLATE, FERRITIN, TIBC, IRON, RETICCTPCT in the last 72 hours. Urine analysis:    Component Value Date/Time   COLORURINE YELLOW 09/04/2017 0230   APPEARANCEUR CLEAR 09/04/2017 0230   LABSPEC 1.017 09/04/2017 0230   PHURINE 5.0 09/04/2017 0230   GLUCOSEU NEGATIVE 09/04/2017 0230   HGBUR SMALL (A) 09/04/2017 0230   BILIRUBINUR NEGATIVE 09/04/2017 0230   KETONESUR NEGATIVE 09/04/2017 0230   PROTEINUR 100 (A) 09/04/2017 0230   NITRITE NEGATIVE 09/04/2017 0230   LEUKOCYTESUR NEGATIVE 09/04/2017 0230   Sepsis Labs: @LABRCNTIP (procalcitonin:4,lacticidven:4) )No results found for this or any previous visit (from the past 240 hour(s)).   Radiological Exams on Admission: Dg  Chest 2 View  Result Date: 09/04/2017 CLINICAL DATA:  Cough, fever, and shortness of breath tonight. EXAM: CHEST  2 VIEW COMPARISON:  07/10/2015 FINDINGS: Unchanged cardiomegaly from prior exam. Pulmonary vascular congestion with slight progression. No confluent consolidation. Calcified granuloma in the right lung. No large pleural effusion. No pneumothorax. No acute osseous abnormalities. IMPRESSION: Chronic cardiomegaly. Pulmonary vascular congestion is unchanged. No focal consolidation. Electronically Signed   By: Rubye Oaks M.D.   On: 09/04/2017 02:29    Assessment/Plan Principal Problem:   SIRS (systemic inflammatory response syndrome)/ Acute respiratory failure with hypoxia  -Presents with 2 days of fever, productive cough with yellow sputum, generalized malaise, chills and nausea; chest x-ray without focal infiltrate; high index of suspicion of viral illness and likely influenza -Given empiric Rocephin/Zithromax IV to cover for secondary pneumonia-we will continue for now-if influenza positive consider discontinuation -Tamiflu 75 mg twice daily -Sputum culture, urinary strep antigen -Supportive care with oxygen, anti-emetics, antipyretics, gentle IV fluid hydration -Follow-up on blood cultures and influenza PCR -Early mobilization -Lower respiratory tract Procalcitonin -MEWS = 4 with a 12.7% chance of ICU admission or death within 60 days  -PSI/PORT Score: Pneumonia Severity Index for Adult CAP Patient age: 57 Years Female: Yes (-10 points) Nursing home resident: No (0 points) Neoplastic disease history: No (0 points) Liver disease: No (0 points) Congestive heart failure: Yes (10 points) Cerebrovascular disease: No (0 points) Renal disease: No (0 points) Altered mental status: No (0 points) Respiratory rate over 29: No (0 points) Systolic blood pressure below 90: No (0 points) Temp below 35.0 deg C (95 deg F) or over 39.9 deg C (103.8 deg F): No (0 points) Pulse over 124:  No (0 points) pH below 7.35: No (0 points) BUN over 29: No (0 points) Sodium below 130: No (0 points) Glucose over 249 ([place]) or over 13.8 (SI): No (0 points) Hematocrit below 30%: No (0 points) Partial pressure of oxygen below 60: Yes (10 points) Pleural effusion on x-ray: No (0 points) Score: 65 points. Risk class II, 0.6-0.7% mortality.  Active Problems:   Hypertension -Holding Norvasc since at increased risk for SIRS related lower BP -Continue BB to prevent withdrawal tachycardia -IV hydralazine prn    Chronic diastolic heart failure/ Mild pulmonary hypertension  -Currently compensated -Holding preadmission Lasix in context of acute febrile upper respiratory illness/insensible fluid losses -Last echocardiogram 2016: EF 50-55%, moderate LVH, grade 1 diastolic dysfunction, mild AR/MR with mild pulmonary hypertension 37 mmHg -Daily weights, strict I's/O -Contnue BB but use shorter acting form in  case BP drops -Was not on ACE I/ARB prior to admission    Class 1 obesity due to excess calories with body mass index (BMI) of 32.0 to 32.9 in adult -Weight reduction strategies per PCP    Microcytic anemia -Globin stable and at baseline 12.2    **Additional lab, imaging and/or diagnostic evaluation at discretion of supervising physician  DVT prophylaxis: Lovenox Code Status: Full Family Communication: Daughter Disposition Plan: Home Consults called: None    Robel Wuertz L. ANP-BC [facility] Pager [phone]   If 7PM-7AM, please contact night-coverage [email or web address] Password [email or web address]  09/04/2017, 7:55 AM

## 2017-09-04 NOTE — ED Triage Notes (Signed)
Pt reports shortness of breath, nausea, chills, cough since today. Exposed to flu by family member.

## 2017-09-04 NOTE — ED Notes (Signed)
Pt ambulated to BR w/out O2. On arrival back to room, pt satting 78%, placed on 3L [place] w/ improvement to 90s in approx 45 seconds. MD Notified, pt reports relief of SOB w/ lying

## 2017-09-04 NOTE — ED Provider Notes (Signed)
[facility]   CSN: [id number] Arrival date & time: 09/04/17  0148     History   Chief Complaint Chief Complaint  Patient presents with  . Fever  . Shortness of Breath    HPI Shannon Simpson is a 57 y.o. female.  HPI 57 year old female who presents to the emergency department with a fever of 103.3 as well as cough and shortness of breath over the past 48 hours.  Recent sick contacts with possible flu exposure.  Patient found to be hypoxic on arrival with O2 sats of 84%.  Denies rash.  No abdominal pain.  Reports nausea without vomiting.  No diarrhea.  Some productive cough.  No back pain.  No altered mental status.  No neck pain or stiffness   Past Medical History:  Diagnosis Date  . CHF (congestive heart failure) (HCC)   . Hypertension     Patient Active Problem List   Diagnosis Date Noted  . SIRS (systemic inflammatory response syndrome) (HCC) 09/04/2017  . Obesity  10/06/2015  . 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 CHF (congestive heart failure) (HCC) 10/03/2015  . Essential hypertension 10/03/2015  . Microcytosis 10/03/2015  . Hypertensive cardiomyopathy (HCC) 07/11/2015  . Acute CHF (congestive heart failure) (HCC) 07/11/2015  . Dyspnea on exertion 07/10/2015    Past Surgical History:  Procedure Laterality Date  . APPENDECTOMY      OB History    No data available       Home Medications    Prior to Admission medications   Medication Sig Start Date End Date Taking? Authorizing Provider  amLODipine (NORVASC) 5 MG tablet Take 1 tablet (5 mg total) by mouth daily. Patient not taking: Reported on 09/04/2017 10/13/15   Phillips HayAlvstad, Kristin L, RPH-CPP  furosemide (LASIX) 40 MG tablet Take 1 tablet (40 mg total) by mouth daily. Patient not taking: Reported on 09/04/2017 10/03/15   Lewayne Buntingrenshaw, Brian S, MD  metoprolol succinate (TOPROL-XL) 25 MG 24 hr tablet Take 1 tablet (25 mg total) by mouth  daily. Patient not taking: Reported on 09/04/2017 10/03/15   Lewayne Buntingrenshaw, Brian S, MD    Family History Family History  Problem Relation Age of Onset  . Heart disease Unknown        No family history    Social History Social History   Tobacco Use  . Smoking status: Never Smoker  . Smokeless tobacco: Never Used  Substance Use Topics  . Alcohol use: No  . Drug use: No     Allergies   Patient has no known allergies.   Review of Systems Review of Systems  All other systems reviewed and are negative.    Physical Exam Updated Vital Signs BP (!) 152/82   Pulse 98   Temp (!) 101.6 F (38.7 C) (Oral)   Resp (!) 22   Ht 5\' 2"  (1.575 m)   Wt 81.6 kg (180 lb)   SpO2 97%   BMI 32.92 kg/m   Physical Exam  Constitutional: She is oriented to person, place, and time. She appears well-developed and well-nourished. No distress.  HENT:  Head: Normocephalic and atraumatic.  Eyes: EOM are normal.  Neck: Normal range of motion.  Cardiovascular: Normal rate, regular rhythm and normal heart sounds.  Pulmonary/Chest: Effort normal and breath sounds normal.  Abdominal: Soft. She exhibits no distension. There is no tenderness.  Musculoskeletal: Normal range of motion.  Neurological: She is alert and oriented to person, place, and time.  Skin: Skin  is warm and dry.  Psychiatric: She has a normal mood and affect. Judgment normal.  Nursing note and vitals reviewed.    ED Treatments / Results  Labs (all labs ordered are listed, but only abnormal results are displayed) Labs Reviewed  COMPREHENSIVE METABOLIC PANEL - Abnormal; Notable for the following components:      Result Value   Potassium 3.4 (*)    Chloride 100 (*)    Glucose, Bld 184 (*)    Calcium 8.7 (*)    Albumin 3.4 (*)    All other components within normal limits  CBC WITH DIFFERENTIAL/PLATELET - Abnormal; Notable for the following components:   WBC 11.7 (*)    RBC 5.76 (*)    MCV 62.7 (*)    MCH 21.2 (*)    RDW  16.2 (*)    Neutro Abs 8.6 (*)    All other components within normal limits  URINALYSIS, ROUTINE W REFLEX MICROSCOPIC - Abnormal; Notable for the following components:   Hgb urine dipstick SMALL (*)    Protein, ur 100 (*)    Squamous Epithelial / LPF 0-5 (*)    All other components within normal limits  CULTURE, BLOOD (ROUTINE X 2)  CULTURE, BLOOD (ROUTINE X 2)  PROTIME-INR  INFLUENZA PANEL BY PCR (TYPE A & B)  I-STAT CG4 LACTIC ACID, ED  I-STAT BETA HCG BLOOD, ED ([facility], [facility], [facility] ONLY)  I-STAT CG4 LACTIC ACID, ED    EKG  EKG Interpretation None       Radiology Dg Chest 2 View  Result Date: 09/04/2017 CLINICAL DATA:  Cough, fever, and shortness of breath tonight. EXAM: CHEST  2 VIEW COMPARISON:  07/10/2015 FINDINGS: Unchanged cardiomegaly from prior exam. Pulmonary vascular congestion with slight progression. No confluent consolidation. Calcified granuloma in the right lung. No large pleural effusion. No pneumothorax. No acute osseous abnormalities. IMPRESSION: Chronic cardiomegaly. Pulmonary vascular congestion is unchanged. No focal consolidation. Electronically Signed   By: Rubye Oaks M.D.   On: 09/04/2017 02:29    Procedures Procedures (including critical care time)  Medications Ordered in ED Medications  cefTRIAXone (ROCEPHIN) 1 g in sodium chloride 0.9 % 100 mL IVPB (1 g Intravenous New Bag/Given 09/04/17 0546)  azithromycin (ZITHROMAX) 500 mg in sodium chloride 0.9 % 250 mL IVPB (not administered)  acetaminophen (TYLENOL) tablet 650 mg (650 mg Oral Given 09/04/17 0237)  sodium chloride 0.9 % bolus 1,000 mL (0 mLs Intravenous Stopped 09/04/17 0502)  sodium chloride 0.9 % bolus 1,000 mL (0 mLs Intravenous Stopped 09/04/17 0553)     Initial Impression / Assessment and Plan / ED Course  I have reviewed the triage vital signs and the nursing notes.  Pertinent labs & imaging results that were available during my care of the patient were reviewed by me and considered in my  medical decision making (see chart for details).     Patient with hypoxia and fever.  Suspect developing pneumonia.  Covered with Rocephin and azithromycin.  Blood cultures pending.  Lactate reassuring.  Temp coming down with Tylenol.  Patient will be admitted for new oxygen requirement.    Final Clinical Impressions(s) / ED Diagnoses   Final diagnoses:  Fever, unspecified fever cause  Hypoxia    ED Discharge Orders    None       Azalia Bilis, MD 09/04/17 [phone]

## 2017-09-05 DIAGNOSIS — I5032 Chronic diastolic (congestive) heart failure: Secondary | ICD-10-CM

## 2017-09-05 DIAGNOSIS — E876 Hypokalemia: Secondary | ICD-10-CM

## 2017-09-05 LAB — COMPREHENSIVE METABOLIC PANEL
ALBUMIN: 3 g/dL — AB (ref 3.5–5.0)
ALK PHOS: 82 U/L (ref 38–126)
ALT: 76 U/L — AB (ref 14–54)
AST: 104 U/L — AB (ref 15–41)
Anion gap: 13 (ref 5–15)
BUN: 9 mg/dL (ref 6–20)
CALCIUM: 8.3 mg/dL — AB (ref 8.9–10.3)
CO2: 20 mmol/L — AB (ref 22–32)
CREATININE: 0.51 mg/dL (ref 0.44–1.00)
Chloride: 104 mmol/L (ref 101–111)
GFR calc non Af Amer: >60 mL/min (ref >60)
GLUCOSE: 120 mg/dL — AB (ref 65–99)
Potassium: 3 mmol/L — ABNORMAL LOW (ref 3.5–5.1)
Sodium: 137 mmol/L (ref 135–145)
Total Bilirubin: 0.8 mg/dL (ref 0.3–1.2)
Total Protein: 6.7 g/dL (ref 6.5–8.1)

## 2017-09-05 LAB — CBC
HCT: 34.4 % — ABNORMAL LOW (ref 36.0–46.0)
HEMOGLOBIN: 11.7 g/dL — AB (ref 12.0–15.0)
MCH: 21.2 pg — AB (ref 26.0–34.0)
MCHC: 34 g/dL (ref 30.0–36.0)
MCV: 62.3 fL — ABNORMAL LOW (ref 78.0–100.0)
Platelets: 136 10*3/uL — ABNORMAL LOW (ref 150–400)
RBC: 5.52 MIL/uL — AB (ref 3.87–5.11)
RDW: 16.7 % — ABNORMAL HIGH (ref 11.5–15.5)
WBC: 6.8 10*3/uL (ref 4.0–10.5)

## 2017-09-05 LAB — STREP PNEUMONIAE URINARY ANTIGEN: Strep Pneumo Urinary Antigen: NEGATIVE

## 2017-09-05 LAB — MAGNESIUM: Magnesium: 1.7 mg/dL (ref 1.7–2.4)

## 2017-09-05 MED ORDER — BENZONATATE 100 MG PO CAPS
200.0000 mg | ORAL_CAPSULE | Freq: Three times a day (TID) | ORAL | Status: DC
  Administered 2017-09-05 – 2017-09-06 (×4): 200 mg via ORAL
  Filled 2017-09-05 (×4): qty 2

## 2017-09-05 MED ORDER — SODIUM CHLORIDE 0.9 % IV SOLN
INTRAVENOUS | Status: AC
  Administered 2017-09-05: 23:00:00 via INTRAVENOUS

## 2017-09-05 MED ORDER — POTASSIUM CHLORIDE CRYS ER 20 MEQ PO TBCR
40.0000 meq | EXTENDED_RELEASE_TABLET | ORAL | Status: AC
  Administered 2017-09-05 (×2): 40 meq via ORAL
  Filled 2017-09-05 (×2): qty 2

## 2017-09-05 NOTE — Progress Notes (Signed)
PROGRESS NOTE   Kc Sedlak  ZOX:[id number]    DOB: October 24, 1960    DOA: 09/04/2017  PCP: Patient, No Pcp Per   I have briefly reviewed patients previous medical records in [facility].  Brief Narrative:  57 year old female with PMH of HTN, chronic diastolic CHF, obesity, anemia, lives at home with her 3 children and 8 grandchildren, 74 of her 22-year-old grandchildren was diagnosed with flu a week ago and missed school since, presented to ED with 2 days history of high fevers (103 F), cough productive of white sputum (denies yellow sputum), dyspnea, chills.  In ED, chest x-ray without focal infiltrate.  Hypoxic at 84%.  Empirically started on antibiotics for CAP and admitted.  Subsequently influenza A positive.  Improving.   Assessment & Plan:   Principal Problem:   SIRS (systemic inflammatory response syndrome) (HCC) Active Problems:   Acute respiratory failure with hypoxia (HCC)   Hypertension   Chronic diastolic heart failure (HCC)   Mild pulmonary hypertension (HCC)   Class 1 obesity due to excess calories with body mass index (BMI) of 32.0 to 32.9 in adult   Microcytic anemia   1. Influenza A with acute bronchitis: Chest x-ray on admission without focal consolidation.  Blood cultures x2: Negative to date.  Influenza panel PCR positive for influenza A and likely the etiology for all her presentation including respiratory and GI.  Discontinued empirically started Rocephin and azithromycin.  Continue Tamiflu.  Supportive treatment. 2. Diarrhea: Likely related to problem #1.  Improved or even resolved.  Patient has some posttussive emesis.  Tessalon added.  Diet as tolerated. 3. Acute respiratory failure with hypoxia: Secondary to problem #1.  Not clinically volume overloaded.  Resolved. 4. Essential hypertension: Uncontrolled.  Amlodipine had been held on admission.  Resume amlodipine.  Continue metoprolol. 5. Chronic diastolic CHF: Compensated.  Temporarily holding Lasix  due to febrile illness and GI losses. 6. Microcytic anemia: No bleeding reported.  Follow CBCs.  Outpatient follow-up. 7. Thrombocytopenia: Likely related to problem #1.  Follow CBCs. 8. Hypokalemia: Secondary to GI losses.  Replace and follow.  Magnesium 1.9. 9. Mild LFT abnormalities: Likely due to problem #1.  Follow LFTs.   DVT prophylaxis: Lovenox Code Status: Full Family Communication: None at bedside Disposition: DC home when medically improved, possibly in the next 1-2 days.   Consultants:  None  Procedures:  None  Antimicrobials:  Tamiflu. Discontinued ceftriaxone and azithromycin.   Subjective: Feels better.  No BM since yesterday.  No abdominal pain.  Denies dyspnea or chest pain.  Has intermittent cough with white sputum and posttussive nonbloody emesis x2.  Fever improved.  Overall feels significantly better than yesterday. Patient was interviewed and examined with assistance of her daughter at bedside. ROS: As above  Objective:  Vitals:   09/04/17 1251 09/04/17 2018 09/05/17 0425 09/05/17 1300  BP: (!) 168/79 (!) 172/92 (!) 145/67 (!) 156/85  Pulse:  100 86 84  Resp:  17 (!) 29 18  Temp:  98.7 F (37.1 C) 98.8 F (37.1 C) 98.4 F (36.9 C)  TempSrc:  Oral Oral Oral  SpO2:  97% 100% 95%  Weight:   73.2 kg (161 lb 6 oz)   Height:        Examination:  General exam: Pleasant middle-aged female, moderately built and nourished, sitting up comfortably in bed. Respiratory system: Slightly harsh breath sounds but no obvious wheezing, rhonchi or crackles. Respiratory effort normal. Cardiovascular system: S1 & S2 heard, RRR. No JVD, murmurs, rubs,  gallops or clicks. No pedal edema. Gastrointestinal system: Abdomen is nondistended, soft and nontender. No organomegaly or masses felt. Normal bowel sounds heard. Central nervous system: Alert and oriented. No focal neurological deficits. Extremities: Symmetric 5 x 5 power. Skin: No rashes, lesions or  ulcers Psychiatry: Judgement and insight appear normal. Mood & affect appropriate.     Data Reviewed: I have personally reviewed following labs and imaging studies  CBC: Recent Labs  Lab 09/04/17 0211 09/05/17 0830  WBC 11.7* 6.8  NEUTROABS 8.6*  --   HGB 12.2 11.7*  HCT 36.1 34.4*  MCV 62.7* 62.3*  PLT 172 136*   Basic Metabolic Panel: Recent Labs  Lab 09/04/17 0211 09/05/17 0726 09/05/17 0830  NA 135 137  --   K 3.4* 3.0*  --   CL 100* 104  --   CO2 23 20*  --   GLUCOSE 184* 120*  --   BUN 14 9  --   CREATININE 0.66 0.51  --   CALCIUM 8.7* 8.3*  --   MG  --   --  1.7   Liver Function Tests: Recent Labs  Lab 09/04/17 0211 09/05/17 0726  AST 32 104*  ALT 24 76*  ALKPHOS 84 82  BILITOT 0.7 0.8  PROT 7.4 6.7  ALBUMIN 3.4* 3.0*   Coagulation Profile: Recent Labs  Lab 09/04/17 0211  INR 1.13     Recent Results (from the past 240 hour(s))  Culture, blood (Routine x 2)     Status: None (Preliminary result)   Collection Time: 09/04/17  2:00 AM  Result Value Ref Range Status   Specimen Description BLOOD LEFT ARM  Final   Special Requests IN PEDIATRIC BOTTLE Blood Culture adequate volume  Final   Culture   Final    NO GROWTH 1 DAY Performed at [facility], [address] [place], [place] [address]    Report Status PENDING  Incomplete  Culture, blood (Routine x 2)     Status: None (Preliminary result)   Collection Time: 09/04/17  2:10 AM  Result Value Ref Range Status   Specimen Description BLOOD RIGHT ARM  Final   Special Requests   Final    BOTTLES DRAWN AEROBIC AND ANAEROBIC Blood Culture adequate volume   Culture   Final    NO GROWTH 1 DAY Performed at [facility], [address] [place], [place] [address]    Report Status PENDING  Incomplete         Radiology Studies: Dg Chest 2 View  Result Date: 09/04/2017 CLINICAL DATA:  Cough, fever, and shortness of breath tonight. EXAM: CHEST  2 VIEW COMPARISON:  07/10/2015  FINDINGS: Unchanged cardiomegaly from prior exam. Pulmonary vascular congestion with slight progression. No confluent consolidation. Calcified granuloma in the right lung. No large pleural effusion. No pneumothorax. No acute osseous abnormalities. IMPRESSION: Chronic cardiomegaly. Pulmonary vascular congestion is unchanged. No focal consolidation. Electronically Signed   By: Rubye OaksMelanie  Ehinger M.D.   On: 09/04/2017 02:29        Scheduled Meds: . benzonatate  200 mg Oral TID  . enoxaparin (LOVENOX) injection  40 mg Subcutaneous Q24H  . metoprolol tartrate  25 mg Oral BID  . oseltamivir  75 mg Oral BID   Continuous Infusions: . sodium chloride       LOS: 1 day     Marcellus ScottAnand Darl Kuss, MD, FACP, [facility]. [facility] Pager [phone]  If 7PM-7AM, please contact night-coverage [email or web address] Password [facility] 09/05/2017, 4:25 PM

## 2017-09-06 DIAGNOSIS — I10 Essential (primary) hypertension: Secondary | ICD-10-CM

## 2017-09-06 DIAGNOSIS — J9601 Acute respiratory failure with hypoxia: Secondary | ICD-10-CM

## 2017-09-06 DIAGNOSIS — R6510 Systemic inflammatory response syndrome (SIRS) of non-infectious origin without acute organ dysfunction: Secondary | ICD-10-CM

## 2017-09-06 DIAGNOSIS — J101 Influenza due to other identified influenza virus with other respiratory manifestations: Principal | ICD-10-CM

## 2017-09-06 LAB — COMPREHENSIVE METABOLIC PANEL
ALT: 63 U/L — AB (ref 14–54)
AST: 67 U/L — ABNORMAL HIGH (ref 15–41)
Albumin: 3.1 g/dL — ABNORMAL LOW (ref 3.5–5.0)
Alkaline Phosphatase: 80 U/L (ref 38–126)
Anion gap: 13 (ref 5–15)
BILIRUBIN TOTAL: 0.8 mg/dL (ref 0.3–1.2)
BUN: 8 mg/dL (ref 6–20)
CALCIUM: 8.5 mg/dL — AB (ref 8.9–10.3)
CO2: 24 mmol/L (ref 22–32)
CREATININE: 0.47 mg/dL (ref 0.44–1.00)
Chloride: 104 mmol/L (ref 101–111)
GFR calc non Af Amer: >60 mL/min (ref >60)
Glucose, Bld: 113 mg/dL — ABNORMAL HIGH (ref 65–99)
Potassium: 3.3 mmol/L — ABNORMAL LOW (ref 3.5–5.1)
Sodium: 141 mmol/L (ref 135–145)
TOTAL PROTEIN: 7 g/dL (ref 6.5–8.1)

## 2017-09-06 LAB — CBC
HCT: 36.4 % (ref 36.0–46.0)
Hemoglobin: 12.4 g/dL (ref 12.0–15.0)
MCH: 21.2 pg — ABNORMAL LOW (ref 26.0–34.0)
MCHC: 34.1 g/dL (ref 30.0–36.0)
MCV: 62.1 fL — ABNORMAL LOW (ref 78.0–100.0)
PLATELETS: 154 10*3/uL (ref 150–400)
RBC: 5.86 MIL/uL — AB (ref 3.87–5.11)
RDW: 16.1 % — AB (ref 11.5–15.5)
WBC: 8.7 10*3/uL (ref 4.0–10.5)

## 2017-09-06 MED ORDER — BENZONATATE 200 MG PO CAPS: 200.0000 mg | ORAL_CAPSULE | Freq: Three times a day (TID) | ORAL | 0 refills | Status: DC

## 2017-09-06 MED ORDER — METOPROLOL TARTRATE 25 MG PO TABS: 25.0000 mg | ORAL_TABLET | Freq: Two times a day (BID) | ORAL | 0 refills | Status: DC

## 2017-09-06 MED ORDER — POTASSIUM CHLORIDE CRYS ER 20 MEQ PO TBCR
40.0000 meq | EXTENDED_RELEASE_TABLET | Freq: Once | ORAL | Status: AC
  Administered 2017-09-06: 40 meq via ORAL
  Filled 2017-09-06: qty 2

## 2017-09-06 MED ORDER — AMLODIPINE BESYLATE 5 MG PO TABS: 5.0000 mg | ORAL_TABLET | Freq: Every day | ORAL | 0 refills | Status: DC

## 2017-09-06 MED ORDER — AMLODIPINE BESYLATE 5 MG PO TABS
5.0000 mg | ORAL_TABLET | Freq: Every day | ORAL | Status: DC
  Administered 2017-09-06: 5 mg via ORAL
  Filled 2017-09-06: qty 1

## 2017-09-06 MED ORDER — OSELTAMIVIR PHOSPHATE 75 MG PO CAPS: 75.0000 mg | ORAL_CAPSULE | Freq: Two times a day (BID) | ORAL | 0 refills | Status: DC

## 2017-09-06 MED ORDER — ACETAMINOPHEN 325 MG PO TABS: 650.0000 mg | ORAL_TABLET | Freq: Four times a day (QID) | ORAL | Status: DC | PRN

## 2017-09-06 MED ORDER — GUAIFENESIN-DM 100-10 MG/5ML PO SYRP: 5.0000 mL | ORAL_SOLUTION | Freq: Four times a day (QID) | ORAL | 0 refills | Status: DC | PRN

## 2017-09-06 NOTE — Progress Notes (Signed)
57 yo F admitted with Influenza A with acute bronchitis. Received referral to assist with PCP and prescriptions. Met with pt and daughter. Pt plans to return home with the support of her family. Pt doesn't have any insurance. Provided pt with a brochure for the [facility] and encouraged pt to contact the center on Monday to make an appointment. Assisted pt with her prescriptions through the [facility] program.

## 2017-09-06 NOTE — Progress Notes (Signed)
Tanga Smedberg to be D/C'd  per MD order. Discussed with the patient and all questions fully answered.  VSS, Skin clean, dry and intact without evidence of skin break down, no evidence of skin tears noted.  IV catheter discontinued intact. Site without signs and symptoms of complications. Dressing and pressure applied.  An After Visit Summary was printed and given to the patient. Patient received prescription.  D/c education completed with patient/family including follow up instructions, medication list, d/c activities limitations if indicated, with other d/c instructions as indicated by MD - patient able to verbalize understanding, all questions fully answered.   Patient instructed to return to ED, call 911, or call MD for any changes in condition.   Patient to be escorted via WC, and D/C home via private auto.

## 2017-09-06 NOTE — Progress Notes (Signed)
[facility]

## 2017-09-06 NOTE — Discharge Summary (Addendum)
Physician Discharge Summary  Shannon Simpson ZOX:[id number] DOB: 23-Jan-1961  PCP: Patient, No Pcp Per  Admit date: 09/04/2017 Discharge date: 09/06/2017  Recommendations for Outpatient Follow-up:  1. [facility]/PCP in 1 week with repeat labs (CBC & CMP).  Case management provided patient with resources to call on Monday, 09/08/17 for the appointment.  Home Health: None Equipment/Devices: None  Discharge Condition: Improved and stable CODE STATUS: Full Diet recommendation: Heart healthy diet.  Discharge Diagnoses:  Principal Problem:   SIRS (systemic inflammatory response syndrome) (HCC) Active Problems:   Acute respiratory failure with hypoxia (HCC)   Hypertension   Chronic diastolic heart failure (HCC)   Mild pulmonary hypertension (HCC)   Class 1 obesity due to excess calories with body mass index (BMI) of 32.0 to 32.9 in adult   Microcytic anemia   Brief Summary: 57 year old female with PMH of HTN, chronic diastolic CHF, obesity, anemia, lives at home with her 3 children and 8 grandchildren, 62 of her 62-year-old grandchildren was diagnosed with flu a week ago and missed school since, presented to ED with 2 days history of high fevers (103 F), cough productive of white sputum (denies yellow sputum), dyspnea, chills.  In ED, chest x-ray without focal infiltrate.  Hypoxic at 84%.  Empirically started on antibiotics for CAP and admitted.  Subsequently influenza A positive.    Assessment & Plan:   1. Influenza A with acute bronchitis: Chest x-ray on admission without focal consolidation.  Blood cultures x2: Negative to date.  Influenza panel PCR positive for influenza A and likely the etiology for all her presentation including respiratory and GI.  Discontinued empirically started Rocephin and azithromycin.  Continue and complete Tamiflu.  Supportive treatment.  Clinically improved. 2. Diarrhea: Likely related to problem #1.  Diarrhea  resolved.  Patient has some posttussive emesis which also has resolved.  Tolerating diet. 3. Acute respiratory failure with hypoxia: Secondary to problem #1.  Not clinically volume overloaded.  Resolved.  Not hypoxic even with activity. 4. Essential hypertension: Uncontrolled. Patient is noncompliant and has not seen a physician in over a year and has not taken any medications.  She cannot recollect the name of her PCP.  Resumed amlodipine 5 mg daily and metoprolol 25 mg twice daily.  Close outpatient follow-up and adjust doses as needed.   5. Chronic diastolic CHF: Compensated.  Had not been on diuretics PTA. 6. Microcytic anemia: No bleeding reported.    Hemoglobin up to 12.4 today.  Outpatient evaluation as deemed necessary. 7. Thrombocytopenia: Likely related to problem #1.    Resolved. 8. Hypokalemia: Secondary to GI losses.  Magnesium 1.9.  Replaced prior to discharge. 9. Mild LFT abnormalities: Likely due to problem #1.    Improving.  Follow LFTs as outpatient.   Consultants:  None  Procedures:  None    Discharge Instructions  Discharge Instructions    Call MD for:  difficulty breathing, headache or visual disturbances   Complete by:  As directed    Call MD for:  extreme fatigue   Complete by:  As directed    Call MD for:  persistant dizziness or light-headedness   Complete by:  As directed    Call MD for:  persistant nausea and vomiting   Complete by:  As directed    Call MD for:  temperature >100.4   Complete by:  As directed    Diet - low sodium heart healthy   Complete by:  As directed  Increase activity slowly   Complete by:  As directed        Medication List    STOP taking these medications   furosemide 40 MG tablet Commonly known as:  LASIX   metoprolol succinate 25 MG 24 hr tablet Commonly known as:  TOPROL-XL     TAKE these medications   acetaminophen 325 MG tablet Commonly known as:  TYLENOL Take 2 tablets (650 mg total) by mouth every 6 (six)  hours as needed for mild pain, moderate pain, fever or headache (or Fever >/= 101).   amLODipine 5 MG tablet Commonly known as:  NORVASC Take 1 tablet (5 mg total) by mouth daily.   benzonatate 200 MG capsule Commonly known as:  TESSALON Take 1 capsule (200 mg total) by mouth 3 (three) times daily.   guaiFENesin-dextromethorphan 100-10 MG/5ML syrup Commonly known as:  ROBITUSSIN DM Take 5 mLs by mouth every 6 (six) hours as needed for cough (chest congestion).   metoprolol tartrate 25 MG tablet Commonly known as:  LOPRESSOR Take 1 tablet (25 mg total) by mouth 2 (two) times daily.   oseltamivir 75 MG capsule Commonly known as:  TAMIFLU Take 1 capsule (75 mg total) by mouth 2 (two) times daily.      Follow-up Information    [facility]. Schedule an appointment as soon as possible for a visit in 1 week(s).   Why:  Call on Monday, 09/08/17 for an appointment to be seen in 1 week with repeat labs (CBC & CMP). Contact information: [address] [place] [id number] [phone]         No Known Allergies    Procedures/Studies: Dg Chest 2 View  Result Date: 09/04/2017 CLINICAL DATA:  Cough, fever, and shortness of breath tonight. EXAM: CHEST  2 VIEW COMPARISON:  07/10/2015 FINDINGS: Unchanged cardiomegaly from prior exam. Pulmonary vascular congestion with slight progression. No confluent consolidation. Calcified granuloma in the right lung. No large pleural effusion. No pneumothorax. No acute osseous abnormalities. IMPRESSION: Chronic cardiomegaly. Pulmonary vascular congestion is unchanged. No focal consolidation. Electronically Signed   By: Rubye OaksMelanie  Ehinger M.D.   On: 09/04/2017 02:29      Subjective: Continues to feel better.  No nausea, vomiting or diarrhea since yesterday.  No abdominal pain.  Tolerating diet.  No dyspnea.  Cough continues to improve, mild, intermittent with white sputum.  Denies any other complaints.   Patient's daughter at bedside assisted with interpretation.  Discharge Exam:  Vitals:   09/05/17 1300 09/05/17 2304 09/06/17 0437 09/06/17 1000  BP: (!) 156/85 (!) 174/94 (!) 180/81 (!) 172/69  Pulse: 84 88 90 94  Resp: 18 20 19 20   Temp: 98.4 F (36.9 C) 98.9 F (37.2 C) 98.5 F (36.9 C)   TempSrc: Oral Oral    SpO2: 95% 100% 94% 97%  Weight:      Height:        General exam: Pleasant middle-aged female, moderately built and nourished, sitting up comfortably in bed.  Does not appear in any distress. Respiratory system:  Improved breath sounds.  Clear to auscultation anteriorly.  Slightly harsh posteriorly but no wheezing, rhonchi or crackles.  No increased work of breathing.  Able to speak in full sentences. Cardiovascular system: S1 & S2 heard, RRR. No JVD, murmurs, rubs, gallops or clicks. No pedal edema. Gastrointestinal system: Abdomen is nondistended, soft and nontender. No organomegaly or masses felt. Normal bowel sounds heard. Central nervous system: Alert and oriented. No  focal neurological deficits. Extremities: Symmetric 5 x 5 power. Skin: No rashes, lesions or ulcers Psychiatry: Judgement and insight appear normal. Mood & affect appropriate.       The results of significant diagnostics from this hospitalization (including imaging, microbiology, ancillary and laboratory) are listed below for reference.     Microbiology: Recent Results (from the past 240 hour(s))  Culture, blood (Routine x 2)     Status: None (Preliminary result)   Collection Time: 09/04/17  2:00 AM  Result Value Ref Range Status   Specimen Description BLOOD LEFT ARM  Final   Special Requests IN PEDIATRIC BOTTLE Blood Culture adequate volume  Final   Culture   Final    NO GROWTH 1 DAY Performed at [facility], [address], [place], [place] [address]    Report Status PENDING  Incomplete  Culture, blood (Routine x 2)     Status: None (Preliminary result)   Collection Time: 09/04/17   2:10 AM  Result Value Ref Range Status   Specimen Description BLOOD RIGHT ARM  Final   Special Requests   Final    BOTTLES DRAWN AEROBIC AND ANAEROBIC Blood Culture adequate volume   Culture   Final    NO GROWTH 1 DAY Performed at [facility], [address] [place], [place] [address]    Report Status PENDING  Incomplete     Labs: CBC: Recent Labs  Lab 09/04/17 0211 09/05/17 0830 09/06/17 0549  WBC 11.7* 6.8 8.7  NEUTROABS 8.6*  --   --   HGB 12.2 11.7* 12.4  HCT 36.1 34.4* 36.4  MCV 62.7* 62.3* 62.1*  PLT 172 136* 154   Basic Metabolic Panel: Recent Labs  Lab 09/04/17 0211 09/05/17 0726 09/05/17 0830 09/06/17 0549  NA 135 137  --  141  K 3.4* 3.0*  --  3.3*  CL 100* 104  --  104  CO2 23 20*  --  24  GLUCOSE 184* 120*  --  113*  BUN 14 9  --  8  CREATININE 0.66 0.51  --  0.47  CALCIUM 8.7* 8.3*  --  8.5*  MG  --   --  1.7  --    Liver Function Tests: Recent Labs  Lab 09/04/17 0211 09/05/17 0726 09/06/17 0549  AST 32 104* 67*  ALT 24 76* 63*  ALKPHOS 84 82 80  BILITOT 0.7 0.8 0.8  PROT 7.4 6.7 7.0  ALBUMIN 3.4* 3.0* 3.1*    Urinalysis    Component Value Date/Time   COLORURINE YELLOW 09/04/2017 0230   APPEARANCEUR CLEAR 09/04/2017 0230   LABSPEC 1.017 09/04/2017 0230   PHURINE 5.0 09/04/2017 0230   GLUCOSEU NEGATIVE 09/04/2017 0230   HGBUR SMALL (A) 09/04/2017 0230   BILIRUBINUR NEGATIVE 09/04/2017 0230   KETONESUR NEGATIVE 09/04/2017 0230   PROTEINUR 100 (A) 09/04/2017 0230   NITRITE NEGATIVE 09/04/2017 0230   LEUKOCYTESUR NEGATIVE 09/04/2017 0230    Discussed in detail with patient's daughter at bedside.  Updated care and answered questions.  Time coordinating discharge: Less than 30 minutes  SIGNED:  Marcellus Scott, MD, FACP, [facility]. [facility] Pager [phone]  If 7PM-7AM, please contact night-coverage [email or web address] Password [facility] 09/06/2017, 1:39 PM

## 2017-09-06 NOTE — Discharge Instructions (Signed)
Please get your medications reviewed and adjusted by your Primary MD. ° °Please request your Primary MD to go over all Hospital Tests and Procedure/Radiological results at the follow up, please get all Hospital records sent to your Prim MD by signing hospital release before you go home. ° °If you had Pneumonia of Lung problems at the Hospital: °Please get a 2 view Chest X ray done in 6-8 weeks after hospital discharge or sooner if instructed by your Primary MD. ° °If you have Congestive Heart Failure: °Please call your Cardiologist or Primary MD anytime you have any of the following symptoms:  °1) 3 pound weight gain in 24 hours or 5 pounds in 1 week  °2) shortness of breath, with or without a dry hacking cough  °3) swelling in the hands, feet or stomach  °4) if you have to sleep on extra pillows at night in order to breathe ° °Follow cardiac low salt diet and 1.5 lit/day fluid restriction. ° °If you have diabetes °Accuchecks 4 times/day, Once in AM empty stomach and then before each meal. °Log in all results and show them to your primary doctor at your next visit. °If any glucose reading is under 80 or above 300 call your primary MD immediately. ° °If you have Seizure/Convulsions/Epilepsy: °Please do not drive, operate heavy machinery, participate in activities at heights or participate in high speed sports until you have seen by Primary MD or a Neurologist and advised to do so again. ° °If you had Gastrointestinal Bleeding: °Please ask your Primary MD to check a complete blood count within one week of discharge or at your next visit. Your endoscopic/colonoscopic biopsies that are pending at the time of discharge, will also need to followed by your Primary MD. ° °Get Medicines reviewed and adjusted. °Please take all your medications with you for your next visit with your Primary MD ° °Please request your Primary MD to go over all hospital tests and procedure/radiological results at the follow up, please ask your  Primary MD to get all Hospital records sent to his/her office. ° °If you experience worsening of your admission symptoms, develop shortness of breath, life threatening emergency, suicidal or homicidal thoughts you must seek medical attention immediately by calling 911 or calling your MD immediately  if symptoms less severe. ° °You must read complete instructions/literature along with all the possible adverse reactions/side effects for all the Medicines you take and that have been prescribed to you. Take any new Medicines after you have completely understood and accpet all the possible adverse reactions/side effects.  ° °Do not drive or operate heavy machinery when taking Pain medications.  ° °Do not take more than prescribed Pain, Sleep and Anxiety Medications ° °Special Instructions: If you have smoked or chewed Tobacco  in the last 2 yrs please stop smoking, stop any regular Alcohol  and or any Recreational drug use. ° °Wear Seat belts while driving. ° °Please note °You were cared for by a hospitalist during your hospital stay. If you have any questions about your discharge medications or the care you received while you were in the hospital after you are discharged, you can call the unit and asked to speak with the hospitalist on call if the hospitalist that took care of you is not available. Once you are discharged, your primary care physician will handle any further medical issues. Please note that NO REFILLS for any discharge medications will be authorized once you are discharged, as it is imperative that you   return to your primary care physician (or establish a relationship with a primary care physician if you do not have one) for your aftercare needs so that they can reassess your need for medications and monitor your lab values.  You can reach the hospitalist office at phone [phone] or fax [phone]   If you do not have a primary care physician, you can call [phone] for a physician  referral.   Influenza, Adult Influenza, more commonly known as the flu, is a viral infection that primarily affects the respiratory tract. The respiratory tract includes organs that help you breathe, such as the lungs, nose, and throat. The flu causes many common cold symptoms, as well as a high fever and body aches. The flu spreads easily from person to person (is contagious). Getting a flu shot (influenza vaccination) every year is the best way to prevent influenza. What are the causes? Influenza is caused by a virus. You can catch the virus by:  Breathing in droplets from an infected person's cough or sneeze.  Touching something that was recently contaminated with the virus and then touching your mouth, nose, or eyes.  What increases the risk? The following factors may make you more likely to get the flu:  Not cleaning your hands frequently with soap and water or alcohol-based hand sanitizer.  Having close contact with many people during cold and flu season.  Touching your mouth, eyes, or nose without washing or sanitizing your hands first.  Not drinking enough fluids or not eating a healthy diet.  Not getting enough sleep or exercise.  Being under a high amount of stress.  Not getting a yearly (annual) flu shot.  You may be at a higher risk of complications from the flu, such as a severe lung infection (pneumonia), if you:  Are over the age of 23.  Are pregnant.  Have a weakened disease-fighting system (immune system). You may have a weakened immune system if you: ? Have HIV or AIDS. ? Are undergoing chemotherapy. ? Aretaking medicines that reduce the activity of (suppress) the immune system.  Have a long-term (chronic) illness, such as heart disease, kidney disease, diabetes, or lung disease.  Have a liver disorder.  Are obese.  Have anemia.  What are the signs or symptoms? Symptoms of this condition typically last 4-10 days and may  include:  Fever.  Chills.  Headache, body aches, or muscle aches.  Sore throat.  Cough.  Runny or congested nose.  Chest discomfort and cough.  Poor appetite.  Weakness or tiredness (fatigue).  Dizziness.  Nausea or vomiting.  How is this diagnosed? This condition may be diagnosed based on your medical history and a physical exam. Your health care provider may do a nose or throat swab test to confirm the diagnosis. How is this treated? If influenza is detected early, you can be treated with antiviral medicine that can reduce the length of your illness and the severity of your symptoms. This medicine may be given by mouth (orally) or through an IV tube that is inserted in one of your veins. The goal of treatment is to relieve symptoms by taking care of yourself at home. This may include taking over-the-counter medicines, drinking plenty of fluids, and adding humidity to the air in your home. In some cases, influenza goes away on its own. Severe influenza or complications from influenza may be treated in a hospital. Follow these instructions at home:  Take over-the-counter and prescription medicines only as told by your health  care provider.  Use a cool mist humidifier to add humidity to the air in your home. This can make breathing easier.  Rest as needed.  Drink enough fluid to keep your urine clear or pale yellow.  Cover your mouth and nose when you cough or sneeze.  Wash your hands with soap and water often, especially after you cough or sneeze. If soap and water are not available, use hand sanitizer.  Stay home from work or school as told by your health care provider. Unless you are visiting your health care provider, try to avoid leaving home until your fever has been gone for 24 hours without the use of medicine.  Keep all follow-up visits as told by your health care provider. This is important. How is this prevented?  Getting an annual flu shot is the best way  to avoid getting the flu. You may get the flu shot in late summer, fall, or winter. Ask your health care provider when you should get your flu shot.  Wash your hands often or use hand sanitizer often.  Avoid contact with people who are sick during cold and flu season.  Eat a healthy diet, drink plenty of fluids, get enough sleep, and exercise regularly. Contact a health care provider if:  You develop new symptoms.  You have: ? Chest pain. ? Diarrhea. ? A fever.  Your cough gets worse.  You produce more mucus.  You feel nauseous or you vomit. Get help right away if:  You develop shortness of breath or difficulty breathing.  Your skin or nails turn a bluish color.  You have severe pain or stiffness in your neck.  You develop a sudden headache or sudden pain in your face or ear.  You cannot stop vomiting. This information is not intended to replace advice given to you by your health care provider. Make sure you discuss any questions you have with your health care provider. Document Released: 06/28/2000 Document Revised: 12/07/2015 Document Reviewed: 04/25/2015 [employer]vier Interactive Patient Education  2017 [employer]

## 2017-09-06 NOTE — Progress Notes (Signed)
SATURATION QUALIFICATIONS: (This note is used to comply with regulatory documentation for home oxygen) ? ?Patient Saturations on Room Air at Rest = 94% ? ?Patient Saturations on Room Air while Ambulating = 95% ? ?Patient Saturations on 0 Liters of oxygen while Ambulating = 95% ? ?Please briefly explain why patient needs home oxygen: ?

## 2017-09-09 LAB — CULTURE, BLOOD (ROUTINE X 2)
CULTURE: NO GROWTH
Culture: NO GROWTH
SPECIAL REQUESTS: ADEQUATE
SPECIAL REQUESTS: ADEQUATE

## 2018-07-17 ENCOUNTER — Inpatient Hospital Stay (HOSPITAL_COMMUNITY)
Admission: EM | Admit: 2018-07-17 | Discharge: 2018-07-19 | DRG: 291 | Disposition: A | Discharge status: Home / Self Care | Payer: Self-pay | Attending: Internal Medicine | Admitting: Internal Medicine

## 2018-07-17 ENCOUNTER — Ambulatory Visit
Admission: EM | Admit: 2018-07-17 | Discharge: 2018-07-17 | Discharge status: Against Medical Advice | Payer: Medicaid Other

## 2018-07-17 ENCOUNTER — Emergency Department (HOSPITAL_COMMUNITY): Payer: Self-pay

## 2018-07-17 ENCOUNTER — Encounter (HOSPITAL_COMMUNITY): Payer: Self-pay | Admitting: Internal Medicine

## 2018-07-17 ENCOUNTER — Other Ambulatory Visit: Payer: Self-pay

## 2018-07-17 DIAGNOSIS — Z791 Long term (current) use of non-steroidal anti-inflammatories (NSAID): Secondary | ICD-10-CM

## 2018-07-17 DIAGNOSIS — Z833 Family history of diabetes mellitus: Secondary | ICD-10-CM

## 2018-07-17 DIAGNOSIS — I160 Hypertensive urgency: Secondary | ICD-10-CM | POA: Diagnosis present

## 2018-07-17 DIAGNOSIS — I110 Hypertensive heart disease with heart failure: Principal | ICD-10-CM | POA: Diagnosis present

## 2018-07-17 DIAGNOSIS — Z79899 Other long term (current) drug therapy: Secondary | ICD-10-CM

## 2018-07-17 DIAGNOSIS — Z9114 Patient's other noncompliance with medication regimen: Secondary | ICD-10-CM

## 2018-07-17 DIAGNOSIS — J9601 Acute respiratory failure with hypoxia: Secondary | ICD-10-CM | POA: Diagnosis present

## 2018-07-17 DIAGNOSIS — I5043 Acute on chronic combined systolic (congestive) and diastolic (congestive) heart failure: Secondary | ICD-10-CM | POA: Diagnosis present

## 2018-07-17 DIAGNOSIS — I10 Essential (primary) hypertension: Secondary | ICD-10-CM

## 2018-07-17 DIAGNOSIS — E669 Obesity, unspecified: Secondary | ICD-10-CM | POA: Diagnosis present

## 2018-07-17 DIAGNOSIS — E0590 Thyrotoxicosis, unspecified without thyrotoxic crisis or storm: Secondary | ICD-10-CM | POA: Diagnosis present

## 2018-07-17 DIAGNOSIS — E039 Hypothyroidism, unspecified: Secondary | ICD-10-CM | POA: Diagnosis present

## 2018-07-17 DIAGNOSIS — I5023 Acute on chronic systolic (congestive) heart failure: Secondary | ICD-10-CM

## 2018-07-17 DIAGNOSIS — I5033 Acute on chronic diastolic (congestive) heart failure: Secondary | ICD-10-CM | POA: Diagnosis present

## 2018-07-17 DIAGNOSIS — Z6832 Body mass index (BMI) 32.0-32.9, adult: Secondary | ICD-10-CM

## 2018-07-17 DIAGNOSIS — E876 Hypokalemia: Secondary | ICD-10-CM | POA: Diagnosis present

## 2018-07-17 DIAGNOSIS — D509 Iron deficiency anemia, unspecified: Secondary | ICD-10-CM | POA: Diagnosis present

## 2018-07-17 DIAGNOSIS — I272 Pulmonary hypertension, unspecified: Secondary | ICD-10-CM | POA: Diagnosis present

## 2018-07-17 LAB — BASIC METABOLIC PANEL
ANION GAP: 8 (ref 5–15)
BUN: 8 mg/dL (ref 6–20)
CO2: 25 mmol/L (ref 22–32)
Calcium: 9.1 mg/dL (ref 8.9–10.3)
Chloride: 107 mmol/L (ref 98–111)
Creatinine, Ser: 0.46 mg/dL (ref 0.44–1.00)
GFR calc Af Amer: >60 mL/min (ref >60)
GLUCOSE: 105 mg/dL — AB (ref 70–99)
POTASSIUM: 3.5 mmol/L (ref 3.5–5.1)
Sodium: 140 mmol/L (ref 135–145)

## 2018-07-17 LAB — CBC
HCT: 38.1 % (ref 36.0–46.0)
HEMOGLOBIN: 12.4 g/dL (ref 12.0–15.0)
MCH: 20.4 pg — ABNORMAL LOW (ref 26.0–34.0)
MCHC: 32.5 g/dL (ref 30.0–36.0)
MCV: 62.6 fL — ABNORMAL LOW (ref 80.0–100.0)
Platelets: 191 10*3/uL (ref 150–400)
RBC: 6.09 MIL/uL — ABNORMAL HIGH (ref 3.87–5.11)
RDW: 18.3 % — ABNORMAL HIGH (ref 11.5–15.5)
WBC: 9 10*3/uL (ref 4.0–10.5)
nRBC: 0 % (ref 0.0–0.2)

## 2018-07-17 LAB — I-STAT BETA HCG BLOOD, ED (MC, WL, AP ONLY)

## 2018-07-17 LAB — I-STAT TROPONIN, ED: TROPONIN I, POC: 0.03 ng/mL (ref 0.00–0.08)

## 2018-07-17 LAB — BRAIN NATRIURETIC PEPTIDE: B Natriuretic Peptide: 920.2 pg/mL — ABNORMAL HIGH (ref 0.0–100.0)

## 2018-07-17 LAB — D-DIMER, QUANTITATIVE: D-Dimer, Quant: 1.7 ug/mL-FEU — ABNORMAL HIGH (ref 0.00–0.50)

## 2018-07-17 MED ORDER — ACETAMINOPHEN 650 MG RE SUPP: 650.0000 mg | Freq: Four times a day (QID) | RECTAL | Status: DC | PRN

## 2018-07-17 MED ORDER — ONDANSETRON HCL 4 MG PO TABS: 4.0000 mg | ORAL_TABLET | Freq: Four times a day (QID) | ORAL | Status: DC | PRN

## 2018-07-17 MED ORDER — ONDANSETRON HCL 4 MG/2ML IJ SOLN: 4.0000 mg | Freq: Four times a day (QID) | INTRAMUSCULAR | Status: DC | PRN

## 2018-07-17 MED ORDER — IOPAMIDOL (ISOVUE-370) INJECTION 76%
INTRAVENOUS | Status: AC
  Administered 2018-07-17: 100 mL
  Filled 2018-07-17: qty 100

## 2018-07-17 MED ORDER — AMLODIPINE BESYLATE 5 MG PO TABS: 5.0000 mg | ORAL_TABLET | Freq: Every day | ORAL | Status: DC

## 2018-07-17 MED ORDER — AMLODIPINE BESYLATE 5 MG PO TABS
5.0000 mg | ORAL_TABLET | Freq: Every day | ORAL | Status: DC
  Administered 2018-07-18 – 2018-07-19 (×3): 5 mg via ORAL
  Filled 2018-07-17 (×3): qty 1

## 2018-07-17 MED ORDER — HYDRALAZINE HCL 20 MG/ML IJ SOLN: 5.0000 mg | INTRAMUSCULAR | Status: DC | PRN

## 2018-07-17 MED ORDER — ENOXAPARIN SODIUM 40 MG/0.4ML ~~LOC~~ SOLN
40.0000 mg | Freq: Every day | SUBCUTANEOUS | Status: DC
  Administered 2018-07-18 – 2018-07-19 (×2): 40 mg via SUBCUTANEOUS
  Filled 2018-07-17 (×2): qty 0.4

## 2018-07-17 MED ORDER — BENZONATATE 100 MG PO CAPS
100.0000 mg | ORAL_CAPSULE | Freq: Once | ORAL | Status: AC
  Administered 2018-07-17: 100 mg via ORAL
  Filled 2018-07-17: qty 1

## 2018-07-17 MED ORDER — FUROSEMIDE 10 MG/ML IJ SOLN
40.0000 mg | Freq: Once | INTRAMUSCULAR | Status: AC
  Administered 2018-07-17: 40 mg via INTRAVENOUS
  Filled 2018-07-17: qty 4

## 2018-07-17 MED ORDER — ACETAMINOPHEN 325 MG PO TABS: 650.0000 mg | ORAL_TABLET | Freq: Four times a day (QID) | ORAL | Status: DC | PRN

## 2018-07-17 NOTE — ED Triage Notes (Signed)
Pt here with c/o cough and sob times 4 months . Pt has pna in the past

## 2018-07-17 NOTE — ED Notes (Signed)
Pt ambulated to bathroom and back with daughter. No balance, strength, or gait issues noted.

## 2018-07-17 NOTE — ED Provider Notes (Signed)
Medical screening examination/treatment/procedure(s) were conducted as a shared visit with non-physician practitioner(s) and myself.  I personally evaluated the patient during the encounter. Briefly, the patient is a 58 y.o. female with history of hypertension who presents to the ED with shortness of breath, hypertension.  Patient with EKG that shows PVCs, there are some ST depressions diffusely.  Patient with mostly shortness of breath, cough.  Has some edema in her legs.  Has some rales on exam.  Had an echocardiogram done couple years back that showed some mild heart failure.  She is placed on 2 L of oxygen for support but oxygen is around 92%.  Patient with possible volume overload from heart failure, pneumonia, PE.  Possible ACS.  Troponin within normal limits.  BNP elevated.  Chest x-ray shows signs of volume overload.  D-dimer was elevated and CT PE scan was performed that showed no pulmonary embolism, no pneumonia.  Otherwise no significant anemia, electrolyte abnormality, kidney injury.  Suspect that patient with heart failure, new diagnosis.  Likely secondary to poorly controlled hypertension.  Will give IV Lasix and admit the patient for further cardiac work-up.  Hemodynamically stable throughout my care.  This chart was dictated using voice recognition software.  Despite best efforts to proofread,  errors can occur which can change the documentation meaning.    EKG Interpretation  Date/Time:  Friday July 17 2018 12:01:13 EST Ventricular Rate:  102 PR Interval:  148 QRS Duration: 76 QT Interval:  400 QTC Calculation: 521 R Axis:   70 Text Interpretation:  Sinus tachycardia with occasional Premature ventricular complexes Right atrial enlargement Left ventricular hypertrophy with repolarization abnormality Abnormal ECG Confirmed by Virgina Norfolk [phone]) on 07/17/2018 4:42:05 PM           Virgina Norfolk, DO 07/17/18 1913

## 2018-07-17 NOTE — ED Notes (Signed)
  Attempted to call report.  RN will call me back. 

## 2018-07-17 NOTE — ED Provider Notes (Addendum)
[facility] Provider Note   CSN: [id number] Arrival date & time: 07/17/18  1037     History   Chief Complaint No chief complaint on file.   HPI Shannon Simpson is a 58 y.o. female with history of CHF, hypertension, obesity, and microcytic anemia presenting for evaluation of acute onset, progressively worsening cough and shortness of breath for 4 months.  She is accompanied by her daughter who served as Nurse, learning disabilitytranslator as patient is primarily Chad[place]ot[place] speaking.  She reports cough productive of clear sputum.  This week she developed stabbing pain to the right lower chest which worsens with cough and bending.  She notes shortness of breath with any degree of exertion and even walking short distances.  Denies significant leg swelling.  She is a non-smoker.  Denies fevers but reports a 40 pound weight loss in the last 2 months.  No abdominal pain, nausea, or vomiting.  Not tried anything for her symptoms.  Was hospitalized last year with pneumonia with new oxygen requirement and was found to have mild pulmonary hypertension and CHF at that time.  Has not been taking any antihypertensive medications since that admission.  Does not currently have a PCP.  The history is provided by the patient.    Past Medical History:  Diagnosis Date  . CHF (congestive heart failure) (HCC)   . Chronic diastolic heart failure (HCC)   . Class 1 obesity with body mass index (BMI) of 32.0 to 32.9 in adult   . Hypertension   . Microcytic anemia   . Mild pulmonary hypertension (HCC)   . Respiratory failure with hypoxia (HCC) 08/2017  . SIRS (systemic inflammatory response syndrome) (HCC) 08/2017    Patient Active Problem List   Diagnosis Date Noted  . SIRS (systemic inflammatory response syndrome) (HCC) 09/04/2017  . Acute respiratory failure with hypoxia (HCC) 09/04/2017  . Hypertension 09/04/2017  . Chronic diastolic heart failure (HCC) 09/04/2017  . Mild pulmonary  hypertension (HCC) 09/04/2017  . Class 1 obesity due to excess calories with body mass index (BMI) of 32.0 to 32.9 in adult 09/04/2017  . Microcytic anemia 09/04/2017  . Obesity  10/06/2015  . 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 CHF (congestive heart failure) (HCC) 10/03/2015  . Essential hypertension 10/03/2015  . Microcytosis 10/03/2015  . Hypertensive cardiomyopathy (HCC) 07/11/2015  . Acute CHF (congestive heart failure) (HCC) 07/11/2015  . Dyspnea on exertion 07/10/2015    Past Surgical History:  Procedure Laterality Date  . APPENDECTOMY       OB History   No obstetric history on file.      Home Medications    Prior to Admission medications   Medication Sig Start Date End Date Taking? Authorizing Provider  acetaminophen (TYLENOL) 325 MG tablet Take 2 tablets (650 mg total) by mouth every 6 (six) hours as needed for mild pain, moderate pain, fever or headache (or Fever >/= 101). 09/06/17   Hongalgi, Maximino GreenlandAnand D, MD  amLODipine (NORVASC) 5 MG tablet Take 1 tablet (5 mg total) by mouth daily. 09/06/17   Hongalgi, Maximino GreenlandAnand D, MD  benzonatate (TESSALON) 200 MG capsule Take 1 capsule (200 mg total) by mouth 3 (three) times daily. 09/06/17   Hongalgi, Maximino GreenlandAnand D, MD  guaiFENesin-dextromethorphan (ROBITUSSIN DM) 100-10 MG/5ML syrup Take 5 mLs by mouth every 6 (six) hours as needed for cough (chest congestion). 09/06/17   Hongalgi, Maximino GreenlandAnand D, MD  metoprolol tartrate (LOPRESSOR) 25 MG tablet Take 1 tablet (25 mg total) by mouth 2 (  two) times daily. 09/06/17   Hongalgi, Maximino Greenland, MD  oseltamivir (TAMIFLU) 75 MG capsule Take 1 capsule (75 mg total) by mouth 2 (two) times daily. 09/06/17   Hongalgi, Maximino Greenland, MD    Family History Family History  Problem Relation Age of Onset  . Heart disease Unknown        No family history    Social History Social History   Tobacco Use  . Smoking status: Never Smoker  . Smokeless tobacco: Never Used  Substance Use Topics  . Alcohol use: No  .  Drug use: No     Allergies   Patient has no known allergies.   Review of Systems Review of Systems  Respiratory: Positive for cough and shortness of breath.   Cardiovascular: Positive for chest pain. Negative for leg swelling.  Gastrointestinal: Negative for abdominal pain, nausea and vomiting.  All other systems reviewed and are negative.    Physical Exam Updated Vital Signs BP (!) 192/99   Pulse (!) 110   Temp 98.2 F (36.8 C) (Oral)   Resp (!) 37   SpO2 96%   Physical Exam Vitals signs and nursing note reviewed.  Constitutional:      Appearance: She is well-developed.  HENT:     Head: Normocephalic and atraumatic.  Eyes:     General:        Right eye: No discharge.        Left eye: No discharge.     Conjunctiva/sclera: Conjunctivae normal.  Neck:     Musculoskeletal: Normal range of motion and neck supple.     Vascular: No JVD.     Trachea: No tracheal deviation.  Cardiovascular:     Rate and Rhythm: Tachycardia present.     Comments: Irregularly irregular rhythm.  Trace edema of the bilateral lower extremities.  Homans sign absent bilaterally.  No palpable cords.  2+ radial and DP/PT pulses bilaterally Pulmonary:     Breath sounds: Rales present.     Comments: Tachypneic.  Bibasilar crackles.  SPO2 saturations 90% on room air with improvement on 2 L via nasal cannula Chest:     Chest wall: No tenderness.  Abdominal:     General: There is no distension.     Tenderness: There is no abdominal tenderness. There is no guarding or rebound.  Skin:    General: Skin is warm and dry.     Findings: No erythema.  Neurological:     Mental Status: She is alert.  Psychiatric:        Behavior: Behavior normal.      ED Treatments / Results  Labs (all labs ordered are listed, but only abnormal results are displayed) Labs Reviewed  BASIC METABOLIC PANEL - Abnormal; Notable for the following components:      Result Value   Glucose, Bld 105 (*)    All other  components within normal limits  CBC - Abnormal; Notable for the following components:   RBC 6.09 (*)    MCV 62.6 (*)    MCH 20.4 (*)    RDW 18.3 (*)    All other components within normal limits  BRAIN NATRIURETIC PEPTIDE - Abnormal; Notable for the following components:   B Natriuretic Peptide 920.2 (*)    All other components within normal limits  D-DIMER, QUANTITATIVE (NOT AT [facility]) - Abnormal; Notable for the following components:   D-Dimer, Quant 1.70 (*)    All other components within normal limits  I-STAT TROPONIN, ED  I-STAT  BETA HCG BLOOD, ED ([facility], [facility], [facility] ONLY)  I-STAT TROPONIN, ED    EKG EKG Interpretation  Date/Time:  Friday July 17 2018 12:01:13 EST Ventricular Rate:  102 PR Interval:  148 QRS Duration: 76 QT Interval:  400 QTC Calculation: 521 R Axis:   70 Text Interpretation:  Sinus tachycardia with occasional Premature ventricular complexes Right atrial enlargement Left ventricular hypertrophy with repolarization abnormality Abnormal ECG Confirmed by Virgina Norfolk [phone]) on 07/17/2018 4:42:05 PM   Radiology Dg Chest 2 View  Result Date: 07/17/2018 CLINICAL DATA:  Cough and congestion which shortness-of-breath 4 months. Right flank pain 1 week. EXAM: CHEST - 2 VIEW COMPARISON:  09/04/2017 and 07/10/2015 FINDINGS: Patient slightly rotated to the left. Lungs are adequately inflated without focal airspace consolidation or effusion. There is mild prominence of the central perihilar markings suggesting mild vascular congestion. Mild cardiomegaly is present. No significant effusion. Remainder of the exam is unchanged. IMPRESSION: Mild cardiomegaly with suggestion of mild vascular congestion. Electronically Signed   By: Elberta Fortis M.D.   On: 07/17/2018 12:49   Ct Angio Chest Pe W And/or Wo Contrast  Result Date: 07/17/2018 CLINICAL DATA:  Short of breath with cough for 2 months. EXAM: CT ANGIOGRAPHY CHEST WITH CONTRAST TECHNIQUE: Multidetector CT imaging of the chest  was performed using the standard protocol during bolus administration of intravenous contrast. Multiplanar CT image reconstructions and MIPs were obtained to evaluate the vascular anatomy. CONTRAST:  ISOVUE-370 IOPAMIDOL (ISOVUE-370) INJECTION 76% COMPARISON:  Current chest radiograph.  Chest CT, 11/16/2013 FINDINGS: Cardiovascular: There is satisfactory opacification of the pulmonary arteries to the segmental level. There is no evidence of a pulmonary embolism. Heart is mildly enlarged. Mild three-vessel coronary artery calcifications. Aorta is not opacified. The ascending portion is dilated to 4 cm. Mild atherosclerosis noted along the arch. Mediastinum/Nodes: No neck base or axillary masses. There are prominent to mildly enlarged mediastinal lymph nodes. Largest prevascular node measures 1 cm short axis. Right paratracheal lymph node measures 13 mm in short axis. There is increased soft tissue along the right hilar structures consistent with poorly defined lymph nodes. There are bilateral hilar calcifications with a subcarinal calcification consistent with healed granulomatous disease. The trachea is widely patent. Esophagus is unremarkable. Lungs/Pleura: There is mosaic type attenuation in the lungs, most prominent in the right lower lobe. This is most likely due to air trapping and suggest small airways disease. This is supported by lower lobe bronchial wall thickening, greater on the right. The areas of ground-glass opacity could reflect infection, but air trapping is felt more likely. No convincing pulmonary edema. No pleural effusion or pneumothorax. There are bilateral calcified granuloma. No suspicious nodules. Upper Abdomen: No acute findings. Musculoskeletal: No fracture or acute finding. No osteoblastic or osteolytic lesions. Review of the MIP images confirms the above findings. IMPRESSION: 1. No evidence of a pulmonary embolism. 2. Mosaic attenuation in the lungs that is most likely due to air  trapping from small airways disease. This is supported by bronchial wall thickening that is most evident in the lower lobes. The ground-glass component of the mosaic attenuation could potentially reflect infection or inflammation. This is felt less likely. 3. Mild cardiomegaly. 4. 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 Recommend annual imaging followup by CTA or MRA. This recommendation follows 2010 ACCF/AHA/AATS/ACR/ASA/SCA/SCAI/SIR/STS/SVM Guidelines for the Diagnosis and Management of Patients with Thoracic Aortic Disease. Circulation. 2010; 121: [id number] 5. Mild mediastinal adenopathy, presumed reactive. Aortic aneurysm NOS (ICD10-I71.9). Electronically Signed   By: Amie Portland  M.D.   On: 07/17/2018 18:22    Procedures .Critical Care Performed by: Jeanie Sewer, PA-C Authorized by: Jeanie Sewer, PA-C   Critical care provider statement:    Critical care time (minutes):  35   Critical care was necessary to treat or prevent imminent or life-threatening deterioration of the following conditions: hypertensive emergency.   Critical care was time spent personally by me on the following activities:  Discussions with consultants, evaluation of patient's response to treatment, examination of patient, ordering and performing treatments and interventions, ordering and review of laboratory studies, ordering and review of radiographic studies, pulse oximetry, re-evaluation of patient's condition, obtaining history from patient or surrogate and review of old charts   I assumed direction of critical care for this patient from another provider in my specialty: no     (including critical care time)  Medications Ordered in ED Medications  hydrALAZINE (APRESOLINE) injection 5 mg (has no administration in time range)  furosemide (LASIX) injection 40 mg (40 mg Intravenous Given 07/17/18 1919)  iopamidol (ISOVUE-370) 76 % injection (100 mLs  Contrast Given 07/17/18 1800)  benzonatate (TESSALON) capsule 100 mg (100 mg  Oral Given 07/17/18 1920)     Initial Impression / Assessment and Plan / ED Course  I have reviewed the triage vital signs and the nursing notes.  Pertinent labs & imaging results that were available during my care of the patient were reviewed by me and considered in my medical decision making (see chart for details).     Patient presenting for evaluation of cough and shortness of breath for 4 months.  She is afebrile, tachycardic hypertensive and tachypneic in the ED.  Placed on 2 L via nasal cannula for comfort with some improvement.  EKG shows sinus tachycardia with occasional PVCs.  Lab work reviewed by me shows no leukocytosis, no anemia, no metabolic derangements.  Her BNP is elevated and her chest x-ray shows cardiomegaly and mild vascular congestion.  No evidence of pneumonia.  Troponin is negative, low suspicion of ACS/MI.  Her d-dimer was elevated but CTA of the chest shows no evidence of PE.  Suspect CHF exacerbation.  She was given IV Lasix in the ED.  Spoke with Dr. Toniann Fail with [facility] hospitalist service who agrees to some care of patient and bring her into the hospital for further evaluation and management.  Patient seen and evaluated Dr. Lockie Mola who agrees with assessment and plan at this time.  Final Clinical Impressions(s) / ED Diagnoses   Final diagnoses:  Acute on chronic systolic congestive heart failure [facility])    ED Discharge Orders    None       Bennye Alm 07/17/18 2002  11:52 PM Documentation augmented to reflect critical care services provided during encounter.     Jeanie Sewer, PA-C 07/17/18 2352    Virgina Norfolk, DO 07/18/18 [phone]

## 2018-07-17 NOTE — H&P (Addendum)
History and Physical    Shannon Simpson AST:[id number] DOB: [birth year] DOA: 07/17/2018  PCP: Patient, No Pcp Per  Patient coming from: Home.  Translation provided by patient's daughter.  Chief Complaint: Shortness of breath.  HPI: Shannon Simpson is a 58 y.o. female with history of hypertension, diastolic CHF last EF measured in December 2016 was 50 to 55% with grade 1 diastolic dysfunction presents to the ER because of increasing shortness of breath on exertion over the last few weeks.  Patient states that she easily gets short of breath on minimal exertion.  Denies any chest pain.  Over the last 2 days has been having some productive cough.  Denies fever chills.  Patient also states that she lost 40 pounds in the last 3 months.  Denies any night sweats.  Patient has not been taking her medications for last 1 year due to financial issues.  Has appointment with primary care next month since he is getting back insurance.  ED Course: In the ER patient's blood pressure is found to be elevated and BNP was around 920 with CT angiogram of the chest done showed no pulmonary embolism but did show some congestion and a trapping.  Given exertional symptoms patient likely has CHF and was given Lasix.  Patient also has been placed on amlodipine which patient used to take previously for blood pressure with PRN IV hydralazine.  Review of Systems: As per HPI, rest all negative.   Past Medical History:  Diagnosis Date  . CHF (congestive heart failure) (HCC)   . Chronic diastolic heart failure (HCC)   . Class 1 obesity with body mass index (BMI) of 32.0 to 32.9 in adult   . Hypertension   . Microcytic anemia   . Mild pulmonary hypertension (HCC)   . Respiratory failure with hypoxia (HCC) 08/2017  . SIRS (systemic inflammatory response syndrome) (HCC) 08/2017    Past Surgical History:  Procedure Laterality Date  . APPENDECTOMY       reports that she has never smoked. She has never used  smokeless tobacco. She reports that she does not drink alcohol or use drugs.  No Known Allergies  Family History  Problem Relation Age of Onset  . Heart disease Other        No family history  . Diabetes Mellitus II Mother     Prior to Admission medications   Medication Sig Start Date End Date Taking? Authorizing Provider  ibuprofen (ADVIL,MOTRIN) 200 MG tablet Take 400 mg by mouth every 6 (six) hours as needed for headache (pain).   Yes [provider]  acetaminophen (TYLENOL) 325 MG tablet Take 2 tablets (650 mg total) by mouth every 6 (six) hours as needed for mild pain, moderate pain, fever or headache (or Fever >/= 101). Patient not taking: Reported on 07/17/2018 09/06/17   Elease Etienne, MD  amLODipine (NORVASC) 5 MG tablet Take 1 tablet (5 mg total) by mouth daily. Patient not taking: Reported on 07/17/2018 09/06/17   Elease Etienne, MD  benzonatate (TESSALON) 200 MG capsule Take 1 capsule (200 mg total) by mouth 3 (three) times daily. Patient not taking: Reported on 07/17/2018 09/06/17   Elease Etienne, MD  guaiFENesin-dextromethorphan (ROBITUSSIN DM) 100-10 MG/5ML syrup Take 5 mLs by mouth every 6 (six) hours as needed for cough (chest congestion). Patient not taking: Reported on 07/17/2018 09/06/17   Elease Etienne, MD  metoprolol tartrate (LOPRESSOR) 25 MG tablet Take 1 tablet (25 mg total) by mouth 2 (two)  times daily. Patient not taking: Reported on 07/17/2018 09/06/17   Elease Etienne, MD    Physical Exam: Vitals:   07/17/18 1715 07/17/18 2001 07/17/18 2015 07/17/18 2054  BP: (!) 192/99 (!) 179/94  (!) 162/118  Pulse: (!) 110 (!) 55 85 (!) 101  Resp: (!) 37 (!) 36  (!) 22  Temp:    98.5 F (36.9 C)  TempSrc:    Oral  SpO2: 96% 100% 94% 95%  Weight:    61.6 kg  Height:    4\' 11"  (1.499 m)      Constitutional: Moderately built and nourished. Vitals:   07/17/18 1715 07/17/18 2001 07/17/18 2015 07/17/18 2054  BP: (!) 192/99 (!) 179/94  (!) 162/118    Pulse: (!) 110 (!) 55 85 (!) 101  Resp: (!) 37 (!) 36  (!) 22  Temp:    98.5 F (36.9 C)  TempSrc:    Oral  SpO2: 96% 100% 94% 95%  Weight:    61.6 kg  Height:    4\' 11"  (1.499 m)   Eyes: Anicteric no pallor. ENMT: No discharge from the ears eyes nose or mouth. Neck: JVD not appreciated no mass felt. Respiratory: No rhonchi mild coarse crepitations. Cardiovascular: S1-S2 heard. Abdomen: Soft nontender bowel sounds present. Musculoskeletal: No edema. Skin: No rash. Neurologic: Alert awake oriented to time place and person.  Moves all extremities. Psychiatric: Appears normal.   Labs on Admission: I have personally reviewed following labs and imaging studies  CBC: Recent Labs  Lab 07/17/18 1212  WBC 9.0  HGB 12.4  HCT 38.1  MCV 62.6*  PLT 191   Basic Metabolic Panel: Recent Labs  Lab 07/17/18 1212  NA 140  K 3.5  CL 107  CO2 25  GLUCOSE 105*  BUN 8  CREATININE 0.46  CALCIUM 9.1   GFR: Estimated Creatinine Clearance: 62.7 mL/min (by C-G formula based on SCr of 0.46 mg/dL). Liver Function Tests: No results for input(s): AST, ALT, ALKPHOS, BILITOT, PROT, ALBUMIN in the last 168 hours. No results for input(s): LIPASE, AMYLASE in the last 168 hours. No results for input(s): AMMONIA in the last 168 hours. Coagulation Profile: No results for input(s): INR, PROTIME in the last 168 hours. Cardiac Enzymes: No results for input(s): CKTOTAL, CKMB, CKMBINDEX, TROPONINI in the last 168 hours. BNP (last 3 results) No results for input(s): PROBNP in the last 8760 hours. HbA1C: No results for input(s): HGBA1C in the last 72 hours. CBG: No results for input(s): GLUCAP in the last 168 hours. Lipid Profile: No results for input(s): CHOL, HDL, LDLCALC, TRIG, CHOLHDL, LDLDIRECT in the last 72 hours. Thyroid Function Tests: No results for input(s): TSH, T4TOTAL, FREET4, T3FREE, THYROIDAB in the last 72 hours. Anemia Panel: No results for input(s): VITAMINB12, FOLATE,  FERRITIN, TIBC, IRON, RETICCTPCT in the last 72 hours. Urine analysis:    Component Value Date/Time   COLORURINE YELLOW 09/04/2017 0230   APPEARANCEUR CLEAR 09/04/2017 0230   LABSPEC 1.017 09/04/2017 0230   PHURINE 5.0 09/04/2017 0230   GLUCOSEU NEGATIVE 09/04/2017 0230   HGBUR SMALL (A) 09/04/2017 0230   BILIRUBINUR NEGATIVE 09/04/2017 0230   KETONESUR NEGATIVE 09/04/2017 0230   PROTEINUR 100 (A) 09/04/2017 0230   NITRITE NEGATIVE 09/04/2017 0230   LEUKOCYTESUR NEGATIVE 09/04/2017 0230   Sepsis Labs: @LABRCNTIP (procalcitonin:4,lacticidven:4) )No results found for this or any previous visit (from the past 240 hour(s)).   Radiological Exams on Admission: Dg Chest 2 View  Result Date: 07/17/2018 CLINICAL DATA:  Cough and  congestion which shortness-of-breath 4 months. Right flank pain 1 week. EXAM: CHEST - 2 VIEW COMPARISON:  09/04/2017 and 07/10/2015 FINDINGS: Patient slightly rotated to the left. Lungs are adequately inflated without focal airspace consolidation or effusion. There is mild prominence of the central perihilar markings suggesting mild vascular congestion. Mild cardiomegaly is present. No significant effusion. Remainder of the exam is unchanged. IMPRESSION: Mild cardiomegaly with suggestion of mild vascular congestion. Electronically Signed   By: Elberta Fortisaniel  Boyle M.D.   On: 07/17/2018 12:49   Ct Angio Chest Pe W And/or Wo Contrast  Result Date: 07/17/2018 CLINICAL DATA:  Short of breath with cough for 2 months. EXAM: CT ANGIOGRAPHY CHEST WITH CONTRAST TECHNIQUE: Multidetector CT imaging of the chest was performed using the standard protocol during bolus administration of intravenous contrast. Multiplanar CT image reconstructions and MIPs were obtained to evaluate the vascular anatomy. CONTRAST:  100mL ISOVUE-370 IOPAMIDOL (ISOVUE-370) INJECTION 76% COMPARISON:  Current chest radiograph.  Chest CT, 11/16/2013 FINDINGS: Cardiovascular: There is satisfactory opacification of the  pulmonary arteries to the segmental level. There is no evidence of a pulmonary embolism. Heart is mildly enlarged. Mild three-vessel coronary artery calcifications. Aorta is not opacified. The ascending portion is dilated to 4 cm. Mild atherosclerosis noted along the arch. Mediastinum/Nodes: No neck base or axillary masses. There are prominent to mildly enlarged mediastinal lymph nodes. Largest prevascular node measures 1 cm short axis. Right paratracheal lymph node measures 13 mm in short axis. There is increased soft tissue along the right hilar structures consistent with poorly defined lymph nodes. There are bilateral hilar calcifications with a subcarinal calcification consistent with healed granulomatous disease. The trachea is widely patent. Esophagus is unremarkable. Lungs/Pleura: There is mosaic type attenuation in the lungs, most prominent in the right lower lobe. This is most likely due to air trapping and suggest small airways disease. This is supported by lower lobe bronchial wall thickening, greater on the right. The areas of ground-glass opacity could reflect infection, but air trapping is felt more likely. No convincing pulmonary edema. No pleural effusion or pneumothorax. There are bilateral calcified granuloma. No suspicious nodules. Upper Abdomen: No acute findings. Musculoskeletal: No fracture or acute finding. No osteoblastic or osteolytic lesions. Review of the MIP images confirms the above findings. IMPRESSION: 1. No evidence of a pulmonary embolism. 2. Mosaic attenuation in the lungs that is most likely due to air trapping from small airways disease. This is supported by bronchial wall thickening that is most evident in the lower lobes. The ground-glass component of the mosaic attenuation could potentially reflect infection or inflammation. This is felt less likely. 3. Mild cardiomegaly. 4. 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 Recommend annual imaging followup by CTA or MRA. This recommendation  follows 2010 ACCF/AHA/AATS/ACR/ASA/SCA/SCAI/SIR/STS/SVM Guidelines for the Diagnosis and Management of Patients with Thoracic Aortic Disease. Circulation. 2010; 121: e266-e369 5. Mild mediastinal adenopathy, presumed reactive. Aortic aneurysm NOS (ICD10-I71.9). Electronically Signed   By: Amie Portlandavid  Ormond M.D.   On: 07/17/2018 18:22    EKG: Independently reviewed.  Sinus tachycardia.  Assessment/Plan Principal Problem:   Acute respiratory failure with hypoxia (HCC) Active Problems:   Acute on chronic diastolic CHF (congestive heart failure) (HCC)   Hypertensive urgency    1. Acute respiratory failure with hypoxia likely from acute on chronic diastolic CHF last EF measured was 50 to 55% with grade 1 diastolic dysfunction in December 2016 -patient has been placed on Lasix 40 mg IV every 12.  Closely follow intake output metabolic panel daily weights cycle  cardiac markers check 2D echo.  Since patient has productive cough we will check procalcitonin and influenza PCR.  Patient is tachycardic will check TSH. 2. Hypertensive urgency likely contributing to #1.  Patient has been restarted on amlodipine is on Lasix also.  PRN IV hydralazine.  Follow blood pressure trends. 3. Weight loss -patient has been having at least 40 pounds weight loss over the last 3 months.  Has a microcytic picture.  Patient probably will need colonoscopy eventually.  Check anemia panel.   DVT prophylaxis: Enoxaparin Code Status: Full code. Family Communication: Patient's daughter. Disposition Plan: Home. Consults called: None. Admission status: Inpatient.   Eduard ClosArshad N Riyah Bardon MD [facility] Pager [phone].  If 7PM-7AM, please contact night-coverage [email or web address] Password [email or web address]  07/17/2018, 10:53 PM

## 2018-07-18 ENCOUNTER — Other Ambulatory Visit: Payer: Self-pay

## 2018-07-18 ENCOUNTER — Inpatient Hospital Stay (HOSPITAL_COMMUNITY): Payer: Self-pay

## 2018-07-18 DIAGNOSIS — I361 Nonrheumatic tricuspid (valve) insufficiency: Secondary | ICD-10-CM

## 2018-07-18 DIAGNOSIS — I340 Nonrheumatic mitral (valve) insufficiency: Secondary | ICD-10-CM

## 2018-07-18 LAB — TSH
TSH: <0.01 u[IU]/mL — ABNORMAL LOW (ref 0.350–4.500)
TSH: <0.01 u[IU]/mL — ABNORMAL LOW (ref 0.350–4.500)

## 2018-07-18 LAB — CBC WITH DIFFERENTIAL/PLATELET
Abs Immature Granulocytes: 0.02 10*3/uL (ref 0.00–0.07)
Basophils Absolute: 0.1 10*3/uL (ref 0.0–0.1)
Basophils Relative: 1 %
Eosinophils Absolute: 0.4 10*3/uL (ref 0.0–0.5)
Eosinophils Relative: 4 %
HCT: 37.5 % (ref 36.0–46.0)
Hemoglobin: 12.3 g/dL (ref 12.0–15.0)
Immature Granulocytes: 0 %
Lymphocytes Relative: 47 %
Lymphs Abs: 3.9 10*3/uL (ref 0.7–4.0)
MCH: 20.2 pg — AB (ref 26.0–34.0)
MCHC: 32.8 g/dL (ref 30.0–36.0)
MCV: 61.6 fL — ABNORMAL LOW (ref 80.0–100.0)
Monocytes Absolute: 1 10*3/uL (ref 0.1–1.0)
Monocytes Relative: 12 %
Neutro Abs: 3.1 10*3/uL (ref 1.7–7.7)
Neutrophils Relative %: 36 %
Platelets: 195 10*3/uL (ref 150–400)
RBC: 6.09 MIL/uL — ABNORMAL HIGH (ref 3.87–5.11)
RDW: 18.6 % — ABNORMAL HIGH (ref 11.5–15.5)
WBC: 8.4 10*3/uL (ref 4.0–10.5)
nRBC: 0 % (ref 0.0–0.2)

## 2018-07-18 LAB — RETICULOCYTES
Immature Retic Fract: 16.3 % — ABNORMAL HIGH (ref 2.3–15.9)
RBC.: 6.09 MIL/uL — ABNORMAL HIGH (ref 3.87–5.11)
Retic Count, Absolute: 64.6 10*3/uL (ref 19.0–186.0)
Retic Ct Pct: 1.1 % (ref 0.4–3.1)

## 2018-07-18 LAB — COMPREHENSIVE METABOLIC PANEL
ALK PHOS: 83 U/L (ref 38–126)
ALT: 18 U/L (ref 0–44)
AST: 26 U/L (ref 15–41)
Albumin: 2.9 g/dL — ABNORMAL LOW (ref 3.5–5.0)
Anion gap: 10 (ref 5–15)
BUN: 9 mg/dL (ref 6–20)
CO2: 27 mmol/L (ref 22–32)
Calcium: 8.7 mg/dL — ABNORMAL LOW (ref 8.9–10.3)
Chloride: 102 mmol/L (ref 98–111)
Creatinine, Ser: 0.47 mg/dL (ref 0.44–1.00)
GFR calc Af Amer: >60 mL/min (ref >60)
GFR calc non Af Amer: >60 mL/min (ref >60)
Glucose, Bld: 105 mg/dL — ABNORMAL HIGH (ref 70–99)
Potassium: 2.8 mmol/L — ABNORMAL LOW (ref 3.5–5.1)
Sodium: 139 mmol/L (ref 135–145)
Total Bilirubin: 1.2 mg/dL (ref 0.3–1.2)
Total Protein: 7.5 g/dL (ref 6.5–8.1)

## 2018-07-18 LAB — HEPATIC FUNCTION PANEL
ALT: 21 U/L (ref 0–44)
AST: 31 U/L (ref 15–41)
Albumin: 3.1 g/dL — ABNORMAL LOW (ref 3.5–5.0)
Alkaline Phosphatase: 92 U/L (ref 38–126)
Bilirubin, Direct: 0.5 mg/dL — ABNORMAL HIGH (ref 0.0–0.2)
Indirect Bilirubin: 0.8 mg/dL (ref 0.3–0.9)
Total Bilirubin: 1.3 mg/dL — ABNORMAL HIGH (ref 0.3–1.2)
Total Protein: 7.5 g/dL (ref 6.5–8.1)

## 2018-07-18 LAB — BASIC METABOLIC PANEL
Anion gap: 6 (ref 5–15)
BUN: 11 mg/dL (ref 6–20)
CHLORIDE: 104 mmol/L (ref 98–111)
CO2: 28 mmol/L (ref 22–32)
Calcium: 9.3 mg/dL (ref 8.9–10.3)
Creatinine, Ser: 0.52 mg/dL (ref 0.44–1.00)
GFR calc non Af Amer: >60 mL/min (ref >60)
Glucose, Bld: 114 mg/dL — ABNORMAL HIGH (ref 70–99)
Potassium: 4.4 mmol/L (ref 3.5–5.1)
SODIUM: 138 mmol/L (ref 135–145)

## 2018-07-18 LAB — TROPONIN I
Troponin I: 0.04 ng/mL (ref <0.03)
Troponin I: 0.04 ng/mL (ref <0.03)
Troponin I: 0.03 ng/mL (ref <0.03)

## 2018-07-18 LAB — VITAMIN B12: Vitamin B-12: 739 pg/mL (ref 180–914)

## 2018-07-18 LAB — T4, FREE: Free T4: 4.13 ng/dL — ABNORMAL HIGH (ref 0.82–1.77)

## 2018-07-18 LAB — IRON AND TIBC
Iron: 38 ug/dL (ref 28–170)
Saturation Ratios: 13 % (ref 10.4–31.8)
TIBC: 288 ug/dL (ref 250–450)
UIBC: 250 ug/dL

## 2018-07-18 LAB — INFLUENZA PANEL BY PCR (TYPE A & B)
Influenza A By PCR: NEGATIVE
Influenza B By PCR: NEGATIVE

## 2018-07-18 LAB — FOLATE: Folate: 24.4 ng/mL (ref >5.9)

## 2018-07-18 LAB — MAGNESIUM
Magnesium: 1.5 mg/dL — ABNORMAL LOW (ref 1.7–2.4)
Magnesium: 2.1 mg/dL (ref 1.7–2.4)

## 2018-07-18 LAB — ECHOCARDIOGRAM COMPLETE
HEIGHTINCHES: 59 in
Weight: 2155.22 oz

## 2018-07-18 LAB — FERRITIN: Ferritin: 179 ng/mL (ref 11–307)

## 2018-07-18 LAB — PROCALCITONIN: Procalcitonin: <0.1 ng/mL

## 2018-07-18 MED ORDER — MAGNESIUM SULFATE 2 GM/50ML IV SOLN
2.0000 g | Freq: Once | INTRAVENOUS | Status: AC
  Administered 2018-07-18: 2 g via INTRAVENOUS
  Filled 2018-07-18: qty 50

## 2018-07-18 MED ORDER — INFLUENZA VAC SPLIT QUAD 0.5 ML IM SUSY
0.5000 mL | PREFILLED_SYRINGE | INTRAMUSCULAR | Status: DC
  Filled 2018-07-18: qty 0.5

## 2018-07-18 MED ORDER — FUROSEMIDE 10 MG/ML IJ SOLN
40.0000 mg | Freq: Two times a day (BID) | INTRAMUSCULAR | Status: DC
  Administered 2018-07-18 – 2018-07-19 (×3): 40 mg via INTRAVENOUS
  Filled 2018-07-18 (×3): qty 4

## 2018-07-18 MED ORDER — POTASSIUM CHLORIDE CRYS ER 20 MEQ PO TBCR
40.0000 meq | EXTENDED_RELEASE_TABLET | ORAL | Status: AC
  Administered 2018-07-18 (×2): 40 meq via ORAL
  Filled 2018-07-18 (×2): qty 2

## 2018-07-18 MED ORDER — METOPROLOL SUCCINATE ER 25 MG PO TB24
25.0000 mg | ORAL_TABLET | Freq: Every day | ORAL | Status: DC
  Administered 2018-07-18 – 2018-07-19 (×2): 25 mg via ORAL
  Filled 2018-07-18 (×2): qty 1

## 2018-07-18 MED ORDER — METHIMAZOLE 5 MG PO TABS
5.0000 mg | ORAL_TABLET | Freq: Two times a day (BID) | ORAL | Status: DC
  Administered 2018-07-18 – 2018-07-19 (×2): 5 mg via ORAL
  Filled 2018-07-18 (×2): qty 1

## 2018-07-18 MED ORDER — HYDROCOD POLST-CPM POLST ER 10-8 MG/5ML PO SUER
5.0000 mL | Freq: Once | ORAL | Status: AC
  Administered 2018-07-18: 5 mL via ORAL
  Filled 2018-07-18: qty 5

## 2018-07-18 MED ORDER — POTASSIUM CHLORIDE CRYS ER 20 MEQ PO TBCR
20.0000 meq | EXTENDED_RELEASE_TABLET | Freq: Every day | ORAL | Status: DC
  Administered 2018-07-18: 20 meq via ORAL
  Filled 2018-07-18: qty 1

## 2018-07-18 MED ORDER — MAGNESIUM SULFATE 2 GM/50ML IV SOLN
2.0000 g | Freq: Once | INTRAVENOUS | Status: DC
  Filled 2018-07-18: qty 50

## 2018-07-18 NOTE — Progress Notes (Signed)
*  PRELIMINARY RESULTS* Echocardiogram 2D Echocardiogram has been performed.  Shannon Simpson 07/18/2018, 9:30 AM

## 2018-07-18 NOTE — Progress Notes (Signed)
Initial Nutrition Assessment  DOCUMENTATION CODES:   Not applicable  INTERVENTION:  Provide nourishment snack. (Ordered).  Encourage adequate PO intake.   NUTRITION DIAGNOSIS:   Increased nutrient needs related to chronic illness(CHF) as evidenced by estimated needs.  GOAL:   Patient will meet greater than or equal to 90% of their needs  MONITOR:   PO intake, Labs, Weight trends, I & O's, Skin  REASON FOR ASSESSMENT:   Malnutrition Screening Tool    ASSESSMENT:   58 y.o. female with history of hypertension, diastolic CHF last EF measured in December 2016 was 50 to 55% with grade 1 diastolic dysfunction presents to the ER because of increasing shortness of breath on exertion over the last few weeks.   Meal completion has been 100%. Pt reports appetite has been fine currently and PTA with usual consumption of at least 3 meals a day. Usual body weight reported to be ~165 lbs which she reported last weighing 3-4 months ago. Pt with a reported 18% weight loss in 3-4 months. Pt reports cause of weight loss unknown to her as she has been eating well at home. Pt requests nourishment snacks. RD to order.    Labs and medications reviewed.   NUTRITION - FOCUSED PHYSICAL EXAM:    Most Recent Value  Orbital Region  No depletion  Upper Arm Region  No depletion  Thoracic and Lumbar Region  No depletion  Buccal Region  No depletion  Temple Region  No depletion  Clavicle Bone Region  No depletion  Clavicle and Acromion Bone Region  No depletion  Scapular Bone Region  No depletion  Dorsal Hand  No depletion  Patellar Region  No depletion  Anterior Thigh Region  No depletion  Posterior Calf Region  No depletion  Edema (RD Assessment)  Mild  Hair  Reviewed  Eyes  Reviewed  Mouth  Reviewed  Skin  Reviewed  Nails  Reviewed       Diet Order:   Diet Order            Diet Heart Room service appropriate? Yes; Fluid consistency: Thin; Fluid restriction: 1200 mL Fluid  Diet  effective now              EDUCATION NEEDS:   Not appropriate for education at this time  Skin:  Skin Assessment: Reviewed RN Assessment  Last BM:  1/3  Height:   Ht Readings from Last 1 Encounters:  07/17/18 4\' 11"  (1.499 m)    Weight:   Wt Readings from Last 1 Encounters:  07/18/18 61.1 kg    Ideal Body Weight:  44.5 kg  BMI:  Body mass index is 27.21 kg/m.  Estimated Nutritional Needs:   Kcal:  1600-1850  Protein:  80-90 grams  Fluid:  1.2 L/day    Roslyn Smiling, MS, RD, LDN Pager # [phone] After hours/ weekend pager # [phone]

## 2018-07-18 NOTE — Progress Notes (Signed)
[facility] Progress Note  Patient: Shannon Simpson FSE:[id number]   PCP: Patient, No Pcp Per DOB: [birth year]   DOA: 07/17/2018   DOS: 07/18/2018   Date of Service: the patient was seen and examined on 07/18/2018  Brief hospital course: Pt. with PMH of HTN, chronic diastolic CHF, weight loss; admitted on 07/17/2018, presented with complaint of shortness of breath, was found to have acute on chronic diastolic CHF secondary to hyperthyroidism. Currently further plan is continue diuresis.  Subjective: Feeling better but still has shortness of breath.  Mild cough.  No nausea no vomiting.  Assessment and Plan: 1.  Acute on chronic diastolic CHF. Echocardiogram performed. EF 55 to 60%, diastolic dysfunction. No significant valvular abnormality no significant wall motion abnormality. We will continue with IV Lasix for now. Monitor on telemetry.  2.  Hypothyroidism. Patient reports that she has significant weight loss over last 1 year. She does not report any lack of appetite or any other acute abnormality. TSH significantly low, free T4 significantly elevated.  No goiter identified on exam. We will check antibodies. Outpatient work-up recommended. With initial start the patient on methimazole 5 mg twice daily and monitor. On Lopressor as well. May require Inderal.  3.  Hypokalemia, hypomagnesemia. Replacing.  4.  Microcytic anemia. H&H stable. Monitor.  Nutrition Problem: Increased nutrient needs Etiology: chronic illness(CHF) Interventions: Interventions: Snacks      Diet: Cardiac diet DVT Prophylaxis: subcutaneous Heparin  Advance goals of care discussion: full code  Family Communication: family was present at bedside, at the time of interview. The pt provided permission to discuss medical plan with the family. Opportunity was given to ask question and all questions were answered satisfactorily.   Disposition:  Discharge to home.  Consultants: none Procedures:  cardiology   Scheduled Meds: . amLODipine  5 mg Oral Daily  . enoxaparin (LOVENOX) injection  40 mg Subcutaneous Daily  . furosemide  40 mg Intravenous Q12H  . [START ON 07/19/2018] Influenza vac split quadrivalent PF  0.5 mL Intramuscular Tomorrow-1000  . methimazole  5 mg Oral BID  . metoprolol succinate  25 mg Oral Daily   Continuous Infusions: PRN Meds: acetaminophen **OR** acetaminophen, hydrALAZINE, ondansetron **OR** ondansetron (ZOFRAN) IV Antibiotics: Anti-infectives (From admission, onward)   None       Objective: Physical Exam: Vitals:   07/17/18 2357 07/18/18 0500 07/18/18 0834 07/18/18 1746  BP: (!) 165/102  (!) 178/86 (!) 155/89  Pulse: (!) 114  84 93  Resp: 19  16 16   Temp: 98.2 F (36.8 C)  98 F (36.7 C) (!) 97.3 F (36.3 C)  TempSrc: Oral  Oral Oral  SpO2: 94%  99% 90%  Weight:  61.1 kg    Height:        Intake/Output Summary (Last 24 hours) at 07/18/2018 1849 Last data filed at 07/18/2018 1800 Gross per 24 hour  Intake 1365.74 ml  Output 1300 ml  Net 65.74 ml   Filed Weights   07/17/18 2054 07/18/18 0500  Weight: 61.6 kg 61.1 kg   General: Alert, Awake and Oriented to Time, Place and Person. Appear in moderate distress, affect appropriate Eyes: PERRL, Conjunctiva normal ENT: Oral Mucosa clear moist. Neck: no JVD, no Abnormal Mass Or lumps Cardiovascular: S1 and S2 Present, no Murmur, Peripheral Pulses Present Respiratory: normal respiratory effort, Bilateral Air entry equal and Decreased, no use of accessory muscle, bilateral  Crackles, no wheezes Abdomen: Bowel Sound present, Soft and no tenderness, no hernia Skin: no redness, no Rash, no induration  Extremities: bilateral Pedal edema, no calf tenderness Neurologic: Grossly no focal neuro deficit. Bilaterally Equal motor strength  Data Reviewed: CBC: Recent Labs  Lab 07/17/18 1212 07/18/18 0613  WBC 9.0 8.4  NEUTROABS  --  3.1  HGB 12.4 12.3  HCT 38.1 37.5  MCV 62.6* 61.6*  PLT 191 195     Basic Metabolic Panel: Recent Labs  Lab 07/17/18 1212 07/17/18 2310 07/18/18 0613 07/18/18 1634  NA 140  --  139 138  K 3.5  --  2.8* 4.4  CL 107  --  102 104  CO2 25  --  27 28  GLUCOSE 105*  --  105* 114*  BUN 8  --  9 11  CREATININE 0.46  --  0.47 0.52  CALCIUM 9.1  --  8.7* 9.3  MG  --  1.5* 2.1  --     Liver Function Tests: Recent Labs  Lab 07/17/18 2310 07/18/18 0613  AST 31 26  ALT 21 18  ALKPHOS 92 83  BILITOT 1.3* 1.2  PROT 7.5 7.5  ALBUMIN 3.1* 2.9*   No results for input(s): LIPASE, AMYLASE in the last 168 hours. No results for input(s): AMMONIA in the last 168 hours. Coagulation Profile: No results for input(s): INR, PROTIME in the last 168 hours. Cardiac Enzymes: Recent Labs  Lab 07/17/18 2310 07/18/18 0613 07/18/18 1225  TROPONINI 0.04* 0.04* 0.03*   BNP (last 3 results) No results for input(s): PROBNP in the last 8760 hours. CBG: No results for input(s): GLUCAP in the last 168 hours. Studies: No results found.   Time spent: 35 minutes  Author: Lynden OxfordPranav Carron Jaggi, MD [facility] Pager: [phone] 07/18/2018 6:49 PM  Between 7PM-7AM, please contact night-coverage at [email or web address], password [email or web address]

## 2018-07-18 NOTE — Progress Notes (Signed)
Cardiology Office Note   Date:  07/20/2018   ID:  Shannon Simpson, DOB [birth year], MRN [id number]  PCP:  Patient, No Pcp Per  Cardiologist:  Dr. Jens Som   Chief Complaint  Patient presents with  . Hospitalization Follow-up  . Congestive Heart Failure    Diastolic      History of Present Illness: Shannon Simpson is a 58 y.o. female who presents for ongoing assessment and management of diastolic CHF, medical noncompliance, and HTN.   She was seen in the ER on 07/17/2018 for complaints of shortness of breath,HTN and cough. She was found to have some edema in her legs. She was found to have volume overload by CXR, CHF. She was treated with IV lasix. She was also found to have hyperthyroidism with TOP elevation suggesting possible Hashimoto, but has had thyroid receptor antibodies positive suggesting Graves disease She was started on  Tapazole 5 mg BID. She was to have further OP follow up.   After diureses, she was discharged on 07/19/2018. She was sent home on lasix 20 mg daily and metoprolol tartrate 25 mg BID.  Echo demonstrated normal EF of 55%-60%.   She comes today with continued cough but not significant congestion. She has some throat rales that is cleared with coughing. She states that she has no chest pain or rapid HR at this time. She has not yet been referred to Endocrinology.    Past Medical History:  Diagnosis Date  . CHF (congestive heart failure) (HCC)   . Chronic diastolic heart failure (HCC)   . Class 1 obesity with body mass index (BMI) of 32.0 to 32.9 in adult   . Hypertension   . Microcytic anemia   . Mild pulmonary hypertension (HCC)   . Respiratory failure with hypoxia (HCC) 08/2017  . SIRS (systemic inflammatory response syndrome) (HCC) 08/2017    Past Surgical History:  Procedure Laterality Date  . APPENDECTOMY       Current Outpatient Medications  Medication Sig Dispense Refill  . acetaminophen (TYLENOL) 325 MG tablet Take 2 tablets (650 mg  total) by mouth every 6 (six) hours as needed for mild pain, moderate pain, fever or headache (or Fever >/= 101).    Marland Kitchen amLODipine (NORVASC) 5 MG tablet Take 1 tablet (5 mg total) by mouth daily. 30 tablet 0  . benzonatate (TESSALON) 200 MG capsule Take 1 capsule (200 mg total) by mouth 3 (three) times daily. 20 capsule 0  . furosemide (LASIX) 20 MG tablet Take 1 tablet (20 mg total) by mouth daily. 30 tablet 0  . guaiFENesin-dextromethorphan (ROBITUSSIN DM) 100-10 MG/5ML syrup Take 5 mLs by mouth every 6 (six) hours as needed for cough (chest congestion). 118 mL 0  . methimazole (TAPAZOLE) 5 MG tablet Take 1 tablet (5 mg total) by mouth 2 (two) times daily. 60 tablet 0  . metoprolol tartrate (LOPRESSOR) 25 MG tablet Take 1 tablet (25 mg total) by mouth 2 (two) times daily. 60 tablet 0   No current facility-administered medications for this visit.     Allergies:   Patient has no known allergies.    Social History:  The patient  reports that she has never smoked. She has never used smokeless tobacco. She reports that she does not drink alcohol or use drugs.   Family History:  The patient's family history includes Diabetes Mellitus II in her mother; Heart disease in an other family member.    ROS: All other systems are reviewed and negative. Unless otherwise mentioned in  H&P    PHYSICAL EXAM: VS:  BP 130/88   Pulse 73   Ht 4\' 11"  (1.499 m)   Wt 133 lb 12.8 oz (60.7 kg)   BMI 27.02 kg/m  , BMI Body mass index is 27.02 kg/m. GEN: Well nourished, well developed, in no acute distress HEENT: normal Neck: no JVD, carotid bruits, or masses Cardiac: RRR; occasional extra systole,  no murmurs, rubs, or gallops,no edema  Respiratory:  Clear to auscultation bilaterally, normal work of breathing GI: soft, nontender, nondistended, + BS MS: no deformity or atrophy Skin: warm and dry, no rash Neuro:  Strength and sensation are intact Psych: euthymic mood, full affect   EKG: Sinus rhythm rate  of 73 bpm, LVH is noted.   Recent Labs: 07/17/2018: B Natriuretic Peptide 920.2 July 28, 2018: ALT 18; TSH <0.010 07/19/2018: BUN 17; Creatinine, Ser 0.68; Hemoglobin 13.4; Magnesium 1.8; Platelets 199; Potassium 3.7; Sodium 139    Lipid Panel    Component Value Date/Time   CHOL 183 07/12/2015 0447   TRIG 97 07/12/2015 0447   HDL 43 07/12/2015 0447   CHOLHDL 4.3 07/12/2015 0447   VLDL 19 07/12/2015 0447   LDLCALC 121 (H) 07/12/2015 0447      Wt Readings from Last 3 Encounters:  07/20/18 133 lb 12.8 oz (60.7 kg)  07-28-2018 134 lb 11.2 oz (61.1 kg)  09/05/17 161 lb 6 oz (73.2 kg)     Other studies Reviewed: Echocardiogram Jul 28, 2018  Left ventricle: The cavity size was normal. There was moderate   concentric hypertrophy. Systolic function was normal. The   estimated ejection fraction was in the range of 55% to 60%. Wall   motion was normal; there were no regional wall motion   abnormalities. Doppler parameters are consistent with abnormal   left ventricular relaxation (grade 1 diastolic dysfunction). - Aortic valve: Trileaflet; mildly thickened, mildly calcified   leaflets. There was trivial regurgitation. - Mitral valve: There was mild regurgitation. - Left atrium: The atrium was mildly dilated. - Pulmonary arteries: Systolic pressure was mildly increased. PA   peak pressure: 39 mm Hg (S).  ASSESSMENT AND PLAN:  1. Acute on chronic diastolic CHF: She was diuresed and placed on po lasix 20 mg daily. Breathing status is much improved but she has an irritating cough. She is on [employer] and Robitussin. She has not yet gotten the Robitussin. She is advised on salt restriction. It will be difficult concerning weights due to ongoing weight loss from thyroid disease. BMET in 2 weeks   2. Questionable Graves Disease: She is referred to Endocrinologist. She is informed that that office will call them to make the appointment.  She remains on methimazole.   3. Hypertension: BP is  currently well controlled. Continue metoprolol and amlodipine.   Current medicines are reviewed at length with the patient today.    Labs/ tests ordered today include: BMET  Bettey Mare. Liborio Nixon, ANP, AACC   07/20/2018 10:46 AM    [facility] [address] Office [phone] Fax [phone]

## 2018-07-19 LAB — CBC
HCT: 40 % (ref 36.0–46.0)
Hemoglobin: 13.4 g/dL (ref 12.0–15.0)
MCH: 20.8 pg — ABNORMAL LOW (ref 26.0–34.0)
MCHC: 33.5 g/dL (ref 30.0–36.0)
MCV: 62.1 fL — ABNORMAL LOW (ref 80.0–100.0)
Platelets: 199 10*3/uL (ref 150–400)
RBC: 6.44 MIL/uL — ABNORMAL HIGH (ref 3.87–5.11)
RDW: 18.6 % — AB (ref 11.5–15.5)
WBC: 9.7 10*3/uL (ref 4.0–10.5)
nRBC: 0 % (ref 0.0–0.2)

## 2018-07-19 LAB — MAGNESIUM: Magnesium: 1.8 mg/dL (ref 1.7–2.4)

## 2018-07-19 LAB — BASIC METABOLIC PANEL
Anion gap: 8 (ref 5–15)
BUN: 17 mg/dL (ref 6–20)
CALCIUM: 9.2 mg/dL (ref 8.9–10.3)
CO2: 30 mmol/L (ref 22–32)
Chloride: 101 mmol/L (ref 98–111)
Creatinine, Ser: 0.68 mg/dL (ref 0.44–1.00)
GFR calc Af Amer: >60 mL/min (ref >60)
GFR calc non Af Amer: >60 mL/min (ref >60)
Glucose, Bld: 181 mg/dL — ABNORMAL HIGH (ref 70–99)
Potassium: 3.7 mmol/L (ref 3.5–5.1)
SODIUM: 139 mmol/L (ref 135–145)

## 2018-07-19 LAB — THYROID ANTIBODIES
Thyroglobulin Antibody: 46.2 IU/mL — ABNORMAL HIGH (ref 0.0–0.9)
Thyroperoxidase Ab SerPl-aCnc: >600 IU/mL — ABNORMAL HIGH (ref 0–34)

## 2018-07-19 MED ORDER — METOPROLOL TARTRATE 25 MG PO TABS: 25.0000 mg | ORAL_TABLET | Freq: Two times a day (BID) | ORAL | 0 refills | Status: DC

## 2018-07-19 MED ORDER — AMLODIPINE BESYLATE 5 MG PO TABS: 5.0000 mg | ORAL_TABLET | Freq: Every day | ORAL | 0 refills | Status: DC

## 2018-07-19 MED ORDER — METHIMAZOLE 5 MG PO TABS: 5.0000 mg | ORAL_TABLET | Freq: Two times a day (BID) | ORAL | 0 refills | Status: DC

## 2018-07-19 MED ORDER — FUROSEMIDE 20 MG PO TABS: 20.0000 mg | ORAL_TABLET | Freq: Every day | ORAL | 0 refills | Status: DC

## 2018-07-19 NOTE — Progress Notes (Signed)
Patient given discharge instructions. Patient and two daughters verablized understanding. 4 scripts sent with patient. Will transfer to vehicle via WC. Patient with no complaints at the current time.

## 2018-07-19 NOTE — Discharge Instructions (Signed)

## 2018-07-19 NOTE — Progress Notes (Signed)
CM talked to patient with her daughter at the bedside; patient has used the [facility] fund within a year so she does not qualify for the program again; Daughter states that she has an apt to follow up with her PCP tomorrow and she will pay for her medication; Alexis Goodell [phone]

## 2018-07-20 ENCOUNTER — Ambulatory Visit (INDEPENDENT_AMBULATORY_CARE_PROVIDER_SITE_OTHER): Payer: Self-pay | Admitting: Adult Health

## 2018-07-20 ENCOUNTER — Encounter: Payer: Self-pay | Admitting: Adult Health

## 2018-07-20 VITALS — BP 130/88 | HR 73 | Ht 59.0 in | Wt 133.8 lb

## 2018-07-20 DIAGNOSIS — I10 Essential (primary) hypertension: Secondary | ICD-10-CM

## 2018-07-20 DIAGNOSIS — E0500 Thyrotoxicosis with diffuse goiter without thyrotoxic crisis or storm: Secondary | ICD-10-CM

## 2018-07-20 DIAGNOSIS — I5032 Chronic diastolic (congestive) heart failure: Secondary | ICD-10-CM

## 2018-07-20 DIAGNOSIS — R899 Unspecified abnormal finding in specimens from other organs, systems and tissues: Secondary | ICD-10-CM

## 2018-07-20 LAB — T3, FREE: T3, Free: 8.9 pg/mL — ABNORMAL HIGH (ref 2.0–4.4)

## 2018-07-20 LAB — T3: T3, Total: 240 ng/dL — ABNORMAL HIGH (ref 71–180)

## 2018-07-20 MED ORDER — AMLODIPINE BESYLATE 5 MG PO TABS: 5.0000 mg | ORAL_TABLET | Freq: Every day | ORAL | 2 refills | Status: DC

## 2018-07-20 MED ORDER — METOPROLOL TARTRATE 25 MG PO TABS: 25.0000 mg | ORAL_TABLET | Freq: Two times a day (BID) | ORAL | 2 refills | Status: DC

## 2018-07-20 MED ORDER — FUROSEMIDE 20 MG PO TABS: 20.0000 mg | ORAL_TABLET | Freq: Every day | ORAL | 2 refills | Status: DC

## 2018-07-20 NOTE — Discharge Summary (Signed)
[facility] Discharge Summary   Patient: Shannon Simpson EXB:[id number]   PCP: Patient, No Pcp Per DOB: 11-27-1960   Date of admission: 07/17/2018   Date of discharge: 07/19/2018 07/20/2018    Discharge Diagnoses:  Principal Problem:   Acute respiratory failure with hypoxia (HCC) Active Problems:   Acute on chronic diastolic CHF (congestive heart failure) (HCC)   Hypertensive urgency Hyperthyroidism  Admitted From: home Disposition:  home  Recommendations for Outpatient Follow-up:  1. Please follow-up with PCP in 1 week, case manager informed the patient regarding following up with [facility]. 2. New referral to endocrinology was placed although the patient may require a different referral by PCP. 3. Patient has a follow-up appointment with cardiology coming up tomorrow.  Diet recommendation: cardiac diet  Activity: The patient is advised to gradually reintroduce usual activities.  Discharge Condition: good  Code Status: cardiac diet  History of present illness: As per the H and P dictated on admission, "Shannon Simpson is a 58 y.o. female with history of hypertension, diastolic CHF last EF measured in December 2016 was 50 to 55% with grade 1 diastolic dysfunction presents to the ER because of increasing shortness of breath on exertion over the last few weeks.  Patient states that she easily gets short of breath on minimal exertion.  Denies any chest pain.  Over the last 2 days has been having some productive cough.  Denies fever chills.  Patient also states that she lost 40 pounds in the last 3 months.  Denies any night sweats.  Patient has not been taking her medications for last 1 year due to financial issues.  Has appointment with primary care next month since he is getting back insurance.  ED Course: In the ER patient's blood pressure is found to be elevated and BNP was around 920 with CT angiogram of the chest done showed no pulmonary  embolism but did show some congestion and a trapping.  Given exertional symptoms patient likely has CHF and was given Lasix.  Patient also has been placed on amlodipine which patient used to take previously for blood pressure with PRN IV hydralazine."  Hospital Course:  Summary of her active problems in the hospital is as following. 1.  Acute on chronic diastolic CHF. Echocardiogram performed. EF 55 to 60%, diastolic dysfunction. No significant valvular abnormality no significant wall motion abnormality. He did with IV Lasix, will transition to oral Lasix and monitor. Patient actually has a follow-up appointment with cardiology on 07/20/2018.  2.  Hyperthyroidism. Patient reports that she has significant weight loss over last 1 year. She does not report any lack of appetite or any other acute abnormality. TSH significantly low, free T4 significantly elevated.  No goiter identified on exam. Thyroid antibodies, TPO elevated suggesting possibility of Hashimoto, but also thyroid receptor antibodies positive suggesting Graves' disease. Outpatient work-up recommended. With initial start the patient on methimazole 5 mg twice daily. On Lopressor as well.  3.  Hypokalemia, hypomagnesemia. Replaced and stable..  4.  Microcytic anemia. H&H stable. Monitor.   no bleeding reported, outpatient work-up.  Patient was ambulatory without any assistance. On the day of the discharge the patient's vitals were stable, and no other acute medical condition were reported by patient. the patient was felt safe to be discharge at home with family.  Consultants: none Procedures: none  DISCHARGE MEDICATION: Allergies as of 07/19/2018   No Known Allergies     Medication List    STOP taking these medications  ibuprofen 200 MG tablet Commonly known as:  ADVIL,MOTRIN     TAKE these medications   acetaminophen 325 MG tablet Commonly known as:  TYLENOL Take 2 tablets (650 mg total) by mouth every 6  (six) hours as needed for mild pain, moderate pain, fever or headache (or Fever >/= 101).   amLODipine 5 MG tablet Commonly known as:  NORVASC Take 1 tablet (5 mg total) by mouth daily.   benzonatate 200 MG capsule Commonly known as:  TESSALON Take 1 capsule (200 mg total) by mouth 3 (three) times daily.   furosemide 20 MG tablet Commonly known as:  LASIX Take 1 tablet (20 mg total) by mouth daily.   guaiFENesin-dextromethorphan 100-10 MG/5ML syrup Commonly known as:  ROBITUSSIN DM Take 5 mLs by mouth every 6 (six) hours as needed for cough (chest congestion).   methimazole 5 MG tablet Commonly known as:  TAPAZOLE Take 1 tablet (5 mg total) by mouth 2 (two) times daily.   metoprolol tartrate 25 MG tablet Commonly known as:  LOPRESSOR Take 1 tablet (25 mg total) by mouth 2 (two) times daily.      No Known Allergies Discharge Instructions    (HEART FAILURE PATIENTS) Call MD:  Anytime you have any of the following symptoms: 1) 3 pound weight gain in 24 hours or 5 pounds in 1 week 2) shortness of breath, with or without a dry hacking cough 3) swelling in the hands, feet or stomach 4) if you have to sleep on extra pillows at night in order to breathe.   Complete by:  As directed    Ambulatory referral to Endocrinology   Complete by:  As directed    Diet - low sodium heart healthy   Complete by:  As directed    Discharge instructions   Complete by:  As directed    It is important that you read the given instructions as well as go over your medication list with RN to help you understand your care after this hospitalization.  Discharge Instructions: Please follow-up with PCP in 1-2 weeks  Please request your primary care physician to go over all Hospital Tests and Procedure/Radiological results at the follow up. Please get all Hospital records sent to your PCP by signing hospital release before you go home.   Do not take more than prescribed Pain, Sleep and Anxiety  Medications. You were cared for by a hospitalist during your hospital stay. If you have any questions about your discharge medications or the care you received while you were in the hospital after you are discharged, you can call the unit @UNIT @ you were admitted to and ask to speak with the hospitalist on call if the hospitalist that took care of you is not available.  Once you are discharged, your primary care physician will handle any further medical issues. Please note that NO REFILLS for any discharge medications will be authorized once you are discharged, as it is imperative that you return to your primary care physician (or establish a relationship with a primary care physician if you do not have one) for your aftercare needs so that they can reassess your need for medications and monitor your lab values. You Must read complete instructions/literature along with all the possible adverse reactions/side effects for all the Medicines you take and that have been prescribed to you. Take any new Medicines after you have completely understood and accept all the possible adverse reactions/side effects.   Increase activity slowly   Complete by:  As directed      Discharge Exam: Filed Weights   07/17/18 2054 07/18/18 0500  Weight: 61.6 kg 61.1 kg   Vitals:   07/18/18 2355 07/19/18 0924  BP: (!) 142/80 (!) 150/78  Pulse: 95 94  Resp: 18 18  Temp: 98.2 F (36.8 C) 98.2 F (36.8 C)  SpO2: 94%    General: Appear in no distress, no Rash; Oral Mucosa moist. Cardiovascular: S1 and S2 Present, no Murmur, no JVD Respiratory: Bilateral Air entry present and Clear to Auscultation, no Crackles, no wheezes Abdomen: Bowel Sound present, Soft and no tenderness Extremities: no Pedal edema, no calf tenderness Neurology: Grossly no focal neuro deficit.  The results of significant diagnostics from this hospitalization (including imaging, microbiology, ancillary and laboratory) are listed below for  reference.    Significant Diagnostic Studies: Dg Chest 2 View  Result Date: 07/17/2018 CLINICAL DATA:  Cough and congestion which shortness-of-breath 4 months. Right flank pain 1 week. EXAM: CHEST - 2 VIEW COMPARISON:  09/04/2017 and 07/10/2015 FINDINGS: Patient slightly rotated to the left. Lungs are adequately inflated without focal airspace consolidation or effusion. There is mild prominence of the central perihilar markings suggesting mild vascular congestion. Mild cardiomegaly is present. No significant effusion. Remainder of the exam is unchanged. IMPRESSION: Mild cardiomegaly with suggestion of mild vascular congestion. Electronically Signed   By: Elberta Fortis M.D.   On: 07/17/2018 12:49   Ct Angio Chest Pe W And/or Wo Contrast  Result Date: 07/17/2018 CLINICAL DATA:  Short of breath with cough for 2 months. EXAM: CT ANGIOGRAPHY CHEST WITH CONTRAST TECHNIQUE: Multidetector CT imaging of the chest was performed using the standard protocol during bolus administration of intravenous contrast. Multiplanar CT image reconstructions and MIPs were obtained to evaluate the vascular anatomy. CONTRAST:  ISOVUE-370 IOPAMIDOL (ISOVUE-370) INJECTION 76% COMPARISON:  Current chest radiograph.  Chest CT, 11/16/2013 FINDINGS: Cardiovascular: There is satisfactory opacification of the pulmonary arteries to the segmental level. There is no evidence of a pulmonary embolism. Heart is mildly enlarged. Mild three-vessel coronary artery calcifications. Aorta is not opacified. The ascending portion is dilated to 4 cm. Mild atherosclerosis noted along the arch. Mediastinum/Nodes: No neck base or axillary masses. There are prominent to mildly enlarged mediastinal lymph nodes. Largest prevascular node measures 1 cm short axis. Right paratracheal lymph node measures 13 mm in short axis. There is increased soft tissue along the right hilar structures consistent with poorly defined lymph nodes. There are bilateral hilar  calcifications with a subcarinal calcification consistent with healed granulomatous disease. The trachea is widely patent. Esophagus is unremarkable. Lungs/Pleura: There is mosaic type attenuation in the lungs, most prominent in the right lower lobe. This is most likely due to air trapping and suggest small airways disease. This is supported by lower lobe bronchial wall thickening, greater on the right. The areas of ground-glass opacity could reflect infection, but air trapping is felt more likely. No convincing pulmonary edema. No pleural effusion or pneumothorax. There are bilateral calcified granuloma. No suspicious nodules. Upper Abdomen: No acute findings. Musculoskeletal: No fracture or acute finding. No osteoblastic or osteolytic lesions. Review of the MIP images confirms the above findings. IMPRESSION: 1. No evidence of a pulmonary embolism. 2. Mosaic attenuation in the lungs that is most likely due to air trapping from small airways disease. This is supported by bronchial wall thickening that is most evident in the lower lobes. The ground-glass component of the mosaic attenuation could potentially reflect infection or inflammation. This is felt less  likely. 3. Mild cardiomegaly. 4. 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 Recommend annual imaging followup by CTA or MRA. This recommendation follows 2010 ACCF/AHA/AATS/ACR/ASA/SCA/SCAI/SIR/STS/SVM Guidelines for the Diagnosis and Management of Patients with Thoracic Aortic Disease. Circulation. 2010; 121: e266-e369 5. Mild mediastinal adenopathy, presumed reactive. Aortic aneurysm NOS (ICD10-I71.9). Electronically Signed   By: Amie Portlandavid  Ormond M.D.   On: 07/17/2018 18:22    Microbiology: No results found for this or any previous visit (from the past 240 hour(s)).   Labs: CBC: Recent Labs  Lab 07/17/18 1212 07/18/18 0613 07/19/18 0345  WBC 9.0 8.4 9.7  NEUTROABS  --  3.1  --   HGB 12.4 12.3 13.4  HCT 38.1 37.5 40.0  MCV 62.6* 61.6* 62.1*  PLT 191 195 199    Basic Metabolic Panel: Recent Labs  Lab 07/17/18 1212 07/17/18 2310 07/18/18 0613 07/18/18 1634 07/19/18 0345  NA 140  --  139 138 139  K 3.5  --  2.8* 4.4 3.7  CL 107  --  102 104 101  CO2 25  --  27 28 30   GLUCOSE 105*  --  105* 114* 181*  BUN 8  --  9 11 17   CREATININE 0.46  --  0.47 0.52 0.68  CALCIUM 9.1  --  8.7* 9.3 9.2  MG  --  1.5* 2.1  --  1.8   Liver Function Tests: Recent Labs  Lab 07/17/18 2310 07/18/18 0613  AST 31 26  ALT 21 18  ALKPHOS 92 83  BILITOT 1.3* 1.2  PROT 7.5 7.5  ALBUMIN 3.1* 2.9*   No results for input(s): LIPASE, AMYLASE in the last 168 hours. No results for input(s): AMMONIA in the last 168 hours. Cardiac Enzymes: Recent Labs  Lab 07/17/18 2310 07/18/18 0613 07/18/18 1225  TROPONINI 0.04* 0.04* 0.03*   BNP (last 3 results) Recent Labs    07/17/18 1212  BNP 920.2*   CBG: No results for input(s): GLUCAP in the last 168 hours. Time spent: 35 minutes  Signed:  Lynden Oxfordranav Jimy Gates  [facility] 07/19/2018  8:50 AM

## 2018-07-20 NOTE — Addendum Note (Signed)
Addended by: Alyson Ingles on: 07/20/2018 04:31 PM   Modules accepted: Orders

## 2018-07-20 NOTE — Patient Instructions (Signed)
Special Instructions: Please call [facility] network for Physician referral at: [phone] -or- Visit [email or web address].  YOU HAVE BEEN REFERRED TO ENDOCRINE FOR YOUR GRAVES DISEASE-THEY WILL BE CALLING YOU TO SCHEDULE APPOINTMENT.  Follow-Up: You will need a follow up appointment in 1 months.  You may see Olga Millers, MD Joni Reining, DNP, AACC  or one of the following Advanced Practice Providers on your designated Care Team: Corine Shelter, [place]  Judy Pimple, [place]  Medication Instructions:  NO CHANGES- Your physician recommends that you continue on your current medications as directed. Please refer to the Current Medication list given to you today. If you need a refill on your cardiac medications before your next appointment, please call your pharmacy.  Labwork: NONE ORDERED Take the provided lab slips with you to the lab for your blood draw.  When you have your labs (blood work) drawn today and your tests are completely normal, you will receive your results only by MyChart Message (if you have MyChart) -OR-  A paper copy in the mail.  If you have any lab test that is abnormal or we need to change your treatment, we will call you to review these results.   At [facility], you and your health needs are our priority.  As part of our continuing mission to provide you with exceptional heart care, we have created designated Provider Care Teams.  These Care Teams include your primary Cardiologist (physician) and Advanced Practice Providers (APPs -  Physician Assistants and Nurse Practitioners) who all work together to provide you with the care you need, when you need it.  Thank you for choosing [facility]!!

## 2018-08-14 NOTE — Progress Notes (Signed)
HPI: FU CHF.  Last echocardiogram January 2020 showed normal LV systolic function, grade 1 diastolic dysfunction, mild mitral regurgitation and mild left atrial enlargement.  CTA January 2020 showed no pulmonary embolus and mildly dilated aortic root at 4 cm.  Recently admitted with hypertensive urgency and diastolic congestive heart failure.  She was not taking her medications.  Patient diuresed and blood pressure medications adjusted.  Patient also found to be hyperthyroid and referred to endocrinology.  Since last seen there is no dyspnea, chest pain, palpitations or syncope.  Current Outpatient Medications  Medication Sig Dispense Refill  . acetaminophen (TYLENOL) 325 MG tablet Take 2 tablets (650 mg total) by mouth every 6 (six) hours as needed for mild pain, moderate pain, fever or headache (or Fever >/= 101).    Marland Kitchen amLODipine (NORVASC) 5 MG tablet Take 1 tablet (5 mg total) by mouth daily. 30 tablet 2  . benzonatate (TESSALON) 200 MG capsule Take 1 capsule (200 mg total) by mouth 3 (three) times daily. 20 capsule 0  . furosemide (LASIX) 20 MG tablet Take 1 tablet (20 mg total) by mouth daily. 30 tablet 2  . guaiFENesin-dextromethorphan (ROBITUSSIN DM) 100-10 MG/5ML syrup Take 5 mLs by mouth every 6 (six) hours as needed for cough (chest congestion). 118 mL 0  . methimazole (TAPAZOLE) 5 MG tablet Take 1 tablet (5 mg total) by mouth 2 (two) times daily. 60 tablet 0  . metoprolol tartrate (LOPRESSOR) 25 MG tablet Take 1 tablet (25 mg total) by mouth 2 (two) times daily. 60 tablet 2   No current facility-administered medications for this visit.      Past Medical History:  Diagnosis Date  . CHF (congestive heart failure) (HCC)   . Chronic diastolic heart failure (HCC)   . Class 1 obesity with body mass index (BMI) of 32.0 to 32.9 in adult   . Hypertension   . Microcytic anemia   . Mild pulmonary hypertension (HCC)   . Respiratory failure with hypoxia (HCC) 08/2017  . SIRS  (systemic inflammatory response syndrome) (HCC) 08/2017    Past Surgical History:  Procedure Laterality Date  . APPENDECTOMY      Social History   Socioeconomic History  . Marital status: Single    Spouse name: Not on file  . Number of children: 3  . Years of education: Not on file  . Highest education level: Not on file  Occupational History  . Not on file  Social Needs  . Financial resource strain: Not on file  . Food insecurity:    Worry: Not on file    Inability: Not on file  . Transportation needs:    Medical: Not on file    Non-medical: Not on file  Tobacco Use  . Smoking status: Never Smoker  . Smokeless tobacco: Never Used  Substance and Sexual Activity  . Alcohol use: No  . Drug use: No  . Sexual activity: Not on file  Lifestyle  . Physical activity:    Days per week: Not on file    Minutes per session: Not on file  . Stress: Not on file  Relationships  . Social connections:    Talks on phone: Not on file    Gets together: Not on file    Attends religious service: Not on file    Active member of club or organization: Not on file    Attends meetings of clubs or organizations: Not on file    Relationship status: Not  on file  . Intimate partner violence:    Fear of current or ex partner: Not on file    Emotionally abused: Not on file    Physically abused: Not on file    Forced sexual activity: Not on file  Other Topics Concern  . Not on file  Social History Narrative  . Not on file    Family History  Problem Relation Age of Onset  . Heart disease Other        No family history  . Diabetes Mellitus II Mother     ROS: no fevers or chills, productive cough, hemoptysis, dysphasia, odynophagia, melena, hematochezia, dysuria, hematuria, rash, seizure activity, orthopnea, PND, pedal edema, claudication. Remaining systems are negative.  Physical Exam: Well-developed well-nourished in no acute distress.  Skin is warm and dry.  HEENT is normal.  Neck  is supple.  Chest is clear to auscultation with normal expansion.  Cardiovascular exam is regular rate and rhythm.  Abdominal exam nontender or distended. No masses palpated. Extremities show no edema. neuro grossly intact  A/P  1 chronic diastolic congestive heart failure-patient has improved since her most recent hospitalization.  We discussed the importance of fluid restriction and low-sodium diet.  Continue Lasix.  Check potassium and renal function.  2 hypertension-blood pressure is elevated.  Increase amlodipine to 10 mg daily and follow.  3 dilated aortic root-plan repeat CTA January 2021.  4 hyperthyroidism-I asked patient to follow-up with primary care and endocrinology for this issue.  Olga MillersBrian Shrita Thien, MD

## 2018-08-24 ENCOUNTER — Encounter: Payer: Self-pay | Admitting: Cardiology

## 2018-08-24 ENCOUNTER — Ambulatory Visit (INDEPENDENT_AMBULATORY_CARE_PROVIDER_SITE_OTHER): Payer: Self-pay | Admitting: Cardiology

## 2018-08-24 VITALS — BP 185/90 | HR 70 | Ht 59.0 in | Wt 143.6 lb

## 2018-08-24 DIAGNOSIS — I10 Essential (primary) hypertension: Secondary | ICD-10-CM

## 2018-08-24 DIAGNOSIS — I5032 Chronic diastolic (congestive) heart failure: Secondary | ICD-10-CM

## 2018-08-24 MED ORDER — AMLODIPINE BESYLATE 10 MG PO TABS: 10.0000 mg | ORAL_TABLET | Freq: Every day | ORAL | 3 refills | Status: DC

## 2018-08-24 NOTE — Patient Instructions (Addendum)
Medication Instructions:  INCREASE AMLODIPINE TO 10 MG ONCE DAILY= 2 OF THE 5 MG TABLETS ONCE DAILY If you need a refill on your cardiac medications before your next appointment, please call your pharmacy.   Lab work: Your physician recommends that you HAVE LAB WORK TODAY If you have labs (blood work) drawn today and your tests are completely normal, you will receive your results only by: Marland Kitchen MyChart Message (if you have MyChart) OR . A paper copy in the mail If you have any lab test that is abnormal or we need to change your treatment, we will call you to review the results.  Follow-Up: At [facility], you and your health needs are our priority.  As part of our continuing mission to provide you with exceptional heart care, we have created designated Provider Care Teams.  These Care Teams include your primary Cardiologist (physician) and Advanced Practice Providers (APPs -  Physician Assistants and Nurse Practitioners) who all work together to provide you with the care you need, when you need it. You will need a follow up appointment in 6 months.  Please call our office 2 months in advance to schedule this appointment.  You may see Olga Millers, MD or one of the following Advanced Practice Providers on your designated Care Team:   Corine Shelter, PA-C Judy Pimple, [place] . Marjie Skiff, PA-C  Your physician recommends that you schedule a follow-up appointment in: 6 WEEKS IN THE HYPERTENSION CLINIC      FOLLOW UP WITH MEDICAL DOCTOR REGARDING THYROID

## 2018-08-25 LAB — BASIC METABOLIC PANEL
BUN / CREAT RATIO: 27 — AB (ref 9–23)
BUN: 13 mg/dL (ref 6–24)
CO2: 26 mmol/L (ref 20–29)
Calcium: 9.5 mg/dL (ref 8.7–10.2)
Chloride: 101 mmol/L (ref 96–106)
Creatinine, Ser: 0.49 mg/dL — ABNORMAL LOW (ref 0.57–1.00)
GFR calc Af Amer: 126 mL/min/{1.73_m2} (ref >59)
GFR calc non Af Amer: 109 mL/min/{1.73_m2} (ref >59)
Glucose: 125 mg/dL — ABNORMAL HIGH (ref 65–99)
Potassium: 4.2 mmol/L (ref 3.5–5.2)
SODIUM: 138 mmol/L (ref 134–144)

## 2018-08-26 ENCOUNTER — Encounter: Payer: Self-pay | Admitting: *Deleted

## 2018-12-24 ENCOUNTER — Other Ambulatory Visit: Payer: Self-pay | Admitting: Cardiology

## 2018-12-24 MED ORDER — METOPROLOL TARTRATE 25 MG PO TABS: 25.0000 mg | ORAL_TABLET | Freq: Two times a day (BID) | ORAL | 9 refills | Status: DC

## 2018-12-24 NOTE — Telephone Encounter (Signed)
°*  STAT* If patient is at the pharmacy, call can be transferred to refill team.   1. Which medications need to be refilled? (please list name of each medication and dose if known) Metoprolol 25 mg,   2. Which pharmacy/location (including street and city if local pharmacy) is medication to be sent to? [place]  3. Do they need a 30 day or 90 day supply? [place]

## 2018-12-29 ENCOUNTER — Other Ambulatory Visit: Payer: Self-pay | Admitting: Adult Health

## 2018-12-29 DIAGNOSIS — I10 Essential (primary) hypertension: Secondary | ICD-10-CM

## 2019-01-01 ENCOUNTER — Other Ambulatory Visit: Payer: Self-pay | Admitting: *Deleted

## 2019-03-23 ENCOUNTER — Encounter: Payer: Medicaid Other | Admitting: Internal Medicine

## 2019-03-23 NOTE — Progress Notes (Signed)
Name: Shannon Simpson  MRN/ DOB: [id number], 1960-08-30    Age/ Sex: 58 y.o., female    PCP: Patient, No Pcp Per   Reason for Endocrinology Evaluation:      Date of Initial Endocrinology Evaluation: 03/23/2019     HPI: [name] is a 58 y.o. female with a past medical history of HTN, CHF and anemia. The patient presented for initial endocrinology clinic visit on 03/23/2019 for consultative assistance with her hyperthyroidism.   Pt presented to the ED for evaluation of cough and  SOB in 07/2018 when she was noted to have a suppressed TSH < 0.01 uIU/mL ,elevated FT4 4.13 ng/dL. She was started on Methimazole in 07/2018.   HISTORY:  Past Medical History:  Past Medical History:  Diagnosis Date  . CHF (congestive heart failure) (HCC)   . Chronic diastolic heart failure (HCC)   . Class 1 obesity with body mass index (BMI) of 32.0 to 32.9 in adult   . Hypertension   . Microcytic anemia   . Mild pulmonary hypertension (HCC)   . Respiratory failure with hypoxia (HCC) 08/2017  . SIRS (systemic inflammatory response syndrome) (HCC) 08/2017    Past Surgical History:  Past Surgical History:  Procedure Laterality Date  . APPENDECTOMY        Social History:  reports that she has never smoked. She has never used smokeless tobacco. She reports that she does not drink alcohol or use drugs.  Family History: family history includes Diabetes Mellitus II in her mother; Heart disease in an other family member.   HOME MEDICATIONS: Allergies as of 03/23/2019   No Known Allergies     Medication List       Accurate as of March 23, 2019 10:25 AM. If you have any questions, ask your nurse or doctor.        acetaminophen 325 MG tablet Commonly known as: TYLENOL Take 2 tablets (650 mg total) by mouth every 6 (six) hours as needed for mild pain, moderate pain, fever or headache (or Fever >/= 101).   amLODipine 10 MG tablet Commonly known as: NORVASC Take 1 tablet (10 mg  total) by mouth daily.   amLODipine 5 MG tablet Commonly known as: NORVASC Take 1 tablet by mouth once daily   benzonatate 200 MG capsule Commonly known as: TESSALON Take 1 capsule (200 mg total) by mouth 3 (three) times daily.   furosemide 20 MG tablet Commonly known as: LASIX Take 1 tablet by mouth once daily   guaiFENesin-dextromethorphan 100-10 MG/5ML syrup Commonly known as: ROBITUSSIN DM Take 5 mLs by mouth every 6 (six) hours as needed for cough (chest congestion).   methimazole 5 MG tablet Commonly known as: TAPAZOLE Take 1 tablet (5 mg total) by mouth 2 (two) times daily.   metoprolol tartrate 25 MG tablet Commonly known as: LOPRESSOR Take 1 tablet (25 mg total) by mouth 2 (two) times daily.         REVIEW OF SYSTEMS: A comprehensive ROS was conducted with the patient and is negative except as per HPI and below:  ROS     OBJECTIVE:  VS: There were no vitals taken for this visit.   Wt Readings from Last 3 Encounters:  08/24/18 143 lb 9.6 oz (65.1 kg)  07/20/18 133 lb 12.8 oz (60.7 kg)  07/18/18 134 lb 11.2 oz (61.1 kg)     EXAM: General: Pt appears well and is in NAD  Hydration: Well-hydrated with moist mucous membranes and good  skin turgor  Eyes: External eye exam normal without stare, lid lag or exophthalmos.  EOM intact.  PERRL.  Ears, Nose, Throat: Hearing: Grossly intact bilaterally Dental: Good dentition  Throat: Clear without mass, erythema or exudate  Neck: General: Supple without adenopathy. Thyroid: Thyroid size normal.  No goiter or nodules appreciated. No thyroid bruit.  Lungs: Clear with good BS bilat with no rales, rhonchi, or wheezes  Heart: Auscultation: RRR.  Abdomen: Normoactive bowel sounds, soft, nontender, without masses or organomegaly palpable  Extremities: Gait and station: Normal gait  Digits and nails: No clubbing, cyanosis, petechiae, or nodes Head and neck: Normal alignment and mobility BL UE: Normal ROM and strength.  BL LE: No pretibial edema normal ROM and strength.  Skin: Hair: Texture and amount normal with gender appropriate distribution Skin Inspection: No rashes, acanthosis nigricans/skin tags. No lipohypertrophy Skin Palpation: Skin temperature, texture, and thickness normal to palpation  Neuro: Cranial nerves: II - XII grossly intact  Cerebellar: Normal coordination and movement; no tremor Motor: Normal strength throughout DTRs: 2+ and symmetric in UE without delay in relaxation phase  Mental Status: Judgment, insight: Intact Orientation: Oriented to time, place, and person Memory: Intact for recent and remote events Mood and affect: No depression, anxiety, or agitation     DATA REVIEWED:   Results for [facility], [name] (MRN [id number]) as of 03/23/2019 12:04  Ref. Range 07/17/2018 23:10 07/18/2018 06:13 07/18/2018 16:34  TSH Latest Ref Range: 0.350 - 4.500 uIU/mL <0.010 (L) <0.010 (L)   Triiodothyronine,Free,Serum Latest Ref Range: 2.0 - 4.4 pg/mL  8.9 (H)   Triiodothyronine (T3) Latest Ref Range: 71 - 180 ng/dL  240 (H)   T4,Free(Direct) Latest Ref Range: 0.82 - 1.77 ng/dL  4.13 (H)   Thyroperoxidase Ab SerPl-aCnc Latest Ref Range: 0 - 34 IU/mL   >600 (H)    ASSESSMENT/PLAN/RECOMMENDATIONS:   1. Hyperthyroidism :     Medications :     Signed electronically by: Mack Guise, MD  [facility]  [address] [place], [place] [address] Phone: [phone] FAX: [phone]   CC: Patient, No Pcp Per No address on file Phone: None Fax: None   Return to Endocrinology clinic as below: Future Appointments  Date Time Provider [place]  03/23/2019  3:00 PM Jenevieve Kirschbaum, Melanie Crazier, MD [facility] None  03/25/2019  9:40 AM Stanford Breed, Denice Bors, MD [facility]

## 2019-03-25 ENCOUNTER — Telehealth: Payer: Self-pay | Admitting: Cardiology

## 2019-04-06 ENCOUNTER — Ambulatory Visit: Payer: Medicaid Other | Admitting: Internal Medicine

## 2019-04-06 NOTE — Progress Notes (Deleted)
Name: Shannon Simpson  MRN/ DOB: [id number], 13-Jul-1961    Age/ Sex: 58 y.o., female    PCP: Patient, No Pcp Per   Reason for Endocrinology Evaluation: Hyperthyroidism     Date of Initial Endocrinology Evaluation: 04/06/2019     HPI: Shannon Simpson is a 58 y.o. female with a past medical history of HTN, CHF. The patient presented for initial endocrinology clinic visit on 04/06/2019 for consultative assistance with her hyperthyroidism  Pt was noted to have a suppressed TSH < 0.01 uIU/mL in 07/2018 after she presented there with acute on chronic CHF episode.   HISTORY:  Past Medical History:  Past Medical History:  Diagnosis Date  . CHF (congestive heart failure) (HCC)   . Chronic diastolic heart failure (HCC)   . Class 1 obesity with body mass index (BMI) of 32.0 to 32.9 in adult   . Hypertension   . Microcytic anemia   . Mild pulmonary hypertension (HCC)   . Respiratory failure with hypoxia (HCC) 08/2017  . SIRS (systemic inflammatory response syndrome) (HCC) 08/2017    Past Surgical History:  Past Surgical History:  Procedure Laterality Date  . APPENDECTOMY        Social History:  reports that she has never smoked. She has never used smokeless tobacco. She reports that she does not drink alcohol or use drugs.  Family History: family history includes Diabetes Mellitus II in her mother; Heart disease in an other family member.   HOME MEDICATIONS: Allergies as of 04/06/2019   No Known Allergies     Medication List       Accurate as of April 06, 2019  8:09 AM. If you have any questions, ask your nurse or doctor.        acetaminophen 325 MG tablet Commonly known as: TYLENOL Take 2 tablets (650 mg total) by mouth every 6 (six) hours as needed for mild pain, moderate pain, fever or headache (or Fever >/= 101).   amLODipine 10 MG tablet Commonly known as: NORVASC Take 1 tablet (10 mg total) by mouth daily.   amLODipine 5 MG tablet Commonly  known as: NORVASC Take 1 tablet by mouth once daily   benzonatate 200 MG capsule Commonly known as: TESSALON Take 1 capsule (200 mg total) by mouth 3 (three) times daily.   furosemide 20 MG tablet Commonly known as: LASIX Take 1 tablet by mouth once daily   guaiFENesin-dextromethorphan 100-10 MG/5ML syrup Commonly known as: ROBITUSSIN DM Take 5 mLs by mouth every 6 (six) hours as needed for cough (chest congestion).   methimazole 5 MG tablet Commonly known as: TAPAZOLE Take 1 tablet (5 mg total) by mouth 2 (two) times daily.   metoprolol tartrate 25 MG tablet Commonly known as: LOPRESSOR Take 1 tablet (25 mg total) by mouth 2 (two) times daily.         REVIEW OF SYSTEMS: A comprehensive ROS was conducted with the patient and is negative except as per HPI and below:  ROS     OBJECTIVE:  VS: There were no vitals taken for this visit.   Wt Readings from Last 3 Encounters:  08/24/18 143 lb 9.6 oz (65.1 kg)  07/20/18 133 lb 12.8 oz (60.7 kg)  07/18/18 134 lb 11.2 oz (61.1 kg)     EXAM: General: Pt appears well and is in NAD  Hydration: Well-hydrated with moist mucous membranes and good skin turgor  Eyes: External eye exam normal without stare, lid lag or exophthalmos.  EOM  intact.  PERRL.  Ears, Nose, Throat: Hearing: Grossly intact bilaterally Dental: Good dentition  Throat: Clear without mass, erythema or exudate  Neck: General: Supple without adenopathy. Thyroid: Thyroid size normal.  No goiter or nodules appreciated. No thyroid bruit.  Lungs: Clear with good BS bilat with no rales, rhonchi, or wheezes  Heart: Auscultation: RRR.  Abdomen: Normoactive bowel sounds, soft, nontender, without masses or organomegaly palpable  Extremities: Gait and station: Normal gait  Digits and nails: No clubbing, cyanosis, petechiae, or nodes Head and neck: Normal alignment and mobility BL UE: Normal ROM and strength. BL LE: No pretibial edema normal ROM and strength.  Skin:  Hair: Texture and amount normal with gender appropriate distribution Skin Inspection: No rashes, acanthosis nigricans/skin tags. No lipohypertrophy Skin Palpation: Skin temperature, texture, and thickness normal to palpation  Neuro: Cranial nerves: II - XII grossly intact  Cerebellar: Normal coordination and movement; no tremor Motor: Normal strength throughout DTRs: 2+ and symmetric in UE without delay in relaxation phase  Mental Status: Judgment, insight: Intact Orientation: Oriented to time, place, and person Memory: Intact for recent and remote events Mood and affect: No depression, anxiety, or agitation     DATA REVIEWED: ***    ASSESSMENT/PLAN/RECOMMENDATIONS:   1. ***    Medications :  Signed electronically by: Mack Guise, MD  [facility]  [address] [place], [place] [address] Phone: [phone] FAX: [phone]   CC: Patient, No Pcp Per No address on file Phone: None Fax: None   Return to Endocrinology clinic as below: Future Appointments  Date Time Provider [place]  04/06/2019  2:00 PM , Melanie Crazier, MD [facility] None

## 2019-04-27 ENCOUNTER — Ambulatory Visit: Payer: Self-pay | Admitting: Internal Medicine

## 2019-04-27 ENCOUNTER — Other Ambulatory Visit: Payer: Self-pay

## 2019-04-27 ENCOUNTER — Encounter: Payer: Self-pay | Admitting: Internal Medicine

## 2019-04-27 VITALS — BP 146/96 | HR 94 | Temp 98.1°F | Ht 59.0 in | Wt 124.2 lb

## 2019-04-27 DIAGNOSIS — E0590 Thyrotoxicosis, unspecified without thyrotoxic crisis or storm: Secondary | ICD-10-CM

## 2019-04-27 LAB — TSH: TSH: <0.01 u[IU]/mL — ABNORMAL LOW (ref 0.35–4.50)

## 2019-04-27 LAB — T4, FREE: Free T4: 4.54 ng/dL — ABNORMAL HIGH (ref 0.60–1.60)

## 2019-04-27 NOTE — Patient Instructions (Signed)
-   Please stop by the lab today, will contact you with the results  

## 2019-04-27 NOTE — Progress Notes (Signed)
Name: Shannon Simpson  MRN/ DOB: [id number], May 01, 1961    Age/ Sex: 58 y.o., female    PCP: Patient, No Pcp Per   Reason for Endocrinology Evaluation: Hyperthyroidism     Date of Initial Endocrinology Evaluation: 04/28/2019     HPI: [name] is a 58 y.o. female with a past medical history of HTN, CHF. The patient presented for initial endocrinology clinic visit on 04/28/2019 for consultative assistance with her hyperthyroidism  Pt was noted to have a suppressed TSH < 0.01 uIU/mL in 07/2018 after she presented there with acute on chronic CHF episode. That's when she was started on Methimazole.    She is accompanied by her daughter "Shannon Simpson " Who was the translator today     Per daughter , pt ran out of methimazole 2 months   She is losing weight, no palpitations, no diarrhea.  Denies anxiety or jittery sensation   Denies local neck enlargement, has occasional   Lives with daughter   Daughter states she is unable to find to find a PCP , as I have encouraged her to establish with a PCP. Daughter states they have called many office and unable to get mother in, I have advised her to reach out to [employer] medicaid Child psychotherapistsocial worker.    HISTORY:  Past Medical History:  Past Medical History:  Diagnosis Date   CHF (congestive heart failure) (HCC)    Chronic diastolic heart failure (HCC)    Class 1 obesity with body mass index (BMI) of 32.0 to 32.9 in adult    Hypertension    Microcytic anemia    Mild pulmonary hypertension (HCC)    Respiratory failure with hypoxia (HCC) 08/2017   SIRS (systemic inflammatory response syndrome) (HCC) 08/2017   Past Surgical History:  Past Surgical History:  Procedure Laterality Date   APPENDECTOMY        Social History:  reports that she has never smoked. She has never used smokeless tobacco. She reports that she does not drink alcohol or use drugs.  Family History: family history includes Diabetes Mellitus II in  her mother; Heart disease in an other family member.   HOME MEDICATIONS: Allergies as of 04/27/2019   No Known Allergies     Medication List       Accurate as of April 27, 2019 11:59 PM. If you have any questions, ask your nurse or doctor.        acetaminophen 325 MG tablet Commonly known as: TYLENOL Take 2 tablets (650 mg total) by mouth every 6 (six) hours as needed for mild pain, moderate pain, fever or headache (or Fever >/= 101).   amLODipine 10 MG tablet Commonly known as: NORVASC Take 1 tablet (10 mg total) by mouth daily.   amLODipine 5 MG tablet Commonly known as: NORVASC Take 1 tablet by mouth once daily   benzonatate 200 MG capsule Commonly known as: TESSALON Take 1 capsule (200 mg total) by mouth 3 (three) times daily.   furosemide 20 MG tablet Commonly known as: LASIX Take 1 tablet by mouth once daily   guaiFENesin-dextromethorphan 100-10 MG/5ML syrup Commonly known as: ROBITUSSIN DM Take 5 mLs by mouth every 6 (six) hours as needed for cough (chest congestion).   methimazole 5 MG tablet Commonly known as: TAPAZOLE Take 1 tablet (5 mg total) by mouth 2 (two) times daily.   metoprolol tartrate 25 MG tablet Commonly known as: LOPRESSOR Take 1 tablet (25 mg total) by mouth 2 (two) times daily.  REVIEW OF SYSTEMS: A comprehensive ROS was conducted with the patient and is negative except as per HPI and below:  Review of Systems  Constitutional: Positive for malaise/fatigue and weight loss.  HENT: Negative for congestion and sore throat.   Eyes: Negative for blurred vision and pain.  Respiratory: Negative for cough and shortness of breath.   Cardiovascular: Negative for chest pain and palpitations.  Gastrointestinal: Negative for diarrhea and nausea.  Neurological: Negative for tingling and tremors.  Psychiatric/Behavioral: Negative for depression. The patient is not nervous/anxious.        OBJECTIVE:  VS: BP (!) 146/96 (BP Location:  Left Arm, Patient Position: Sitting, Cuff Size: Normal)    Pulse 94    Temp 98.1 F (36.7 C)    Ht 4\' 11"  (1.499 m)    Wt 124 lb 3.2 oz (56.3 kg)    SpO2 98%    BMI 25.09 kg/m    Wt Readings from Last 3 Encounters:  04/27/19 124 lb 3.2 oz (56.3 kg)  08/24/18 143 lb 9.6 oz (65.1 kg)  07/20/18 133 lb 12.8 oz (60.7 kg)     EXAM: General: Pt appears well and is in NAD  Eyes: External eye exam normal without stare, lid lag or exophthalmos.  EOM intact.   Ears, Nose, Throat: Hearing: Grossly intact bilaterally Throat: Clear without mass, erythema or exudate  Neck: General: Supple without adenopathy. Thyroid: Thyroid size normal.  No goiter or nodules appreciated. No thyroid bruit.  Lungs: Clear with good BS bilat with no rales, rhonchi, or wheezes  Heart: Auscultation: RRR.  Abdomen: Normoactive bowel sounds, soft, nontender, without masses or organomegaly palpable  Extremities:  BL LE: No pretibial edema normal ROM and strength.  Skin: Hair: Texture and amount normal with gender appropriate distribution Skin Inspection: No rashes Skin Palpation: Skin temperature, texture, and thickness normal to palpation  Neuro: Cranial nerves: II - XII grossly intact  Motor: Normal strength throughout DTRs: 2+ and symmetric in UE without delay in relaxation phase  Mental Status: Judgment, insight: Intact Orientation: Oriented to time, place, and person Mood and affect: No depression, anxiety, or agitation     DATA REVIEWED: Results for [facility], [place] (MRN [id number]) as of 04/28/2019 08:39  Ref. Range 07/19/2018 03:45  WBC Latest Ref Range: 4.0 - 10.5 K/uL 9.7  RBC Latest Ref Range: 3.87 - 5.11 MIL/uL 6.44 (H)  Hemoglobin Latest Ref Range: 12.0 - 15.0 g/dL 13.4  HCT Latest Ref Range: 36.0 - 46.0 % 40.0  MCV Latest Ref Range: 80.0 - 100.0 fL 62.1 (L)  MCH Latest Ref Range: 26.0 - 34.0 pg 20.8 (L)  MCHC Latest Ref Range: 30.0 - 36.0 g/dL 33.5  RDW Latest Ref Range: 11.5 - 15.5 % 18.6 (H)    Platelets Latest Ref Range: 150 - 400 K/uL 199  nRBC Latest Ref Range: 0.0 - 0.2 % 0.0  Results for [facility], [place] (MRN [id number]) as of 04/28/2019 08:39  Ref. Range 07/18/2018 06:13  Sodium Latest Ref Range: 135 - 145 mmol/L 139  Potassium Latest Ref Range: 3.5 - 5.1 mmol/L 2.8 (L)  Chloride Latest Ref Range: 98 - 111 mmol/L 102  CO2 Latest Ref Range: 22 - 32 mmol/L 27  Glucose Latest Ref Range: 70 - 99 mg/dL 105 (H)  BUN Latest Ref Range: 6 - 20 mg/dL 9  Creatinine Latest Ref Range: 0.44 - 1.00 mg/dL 0.47  Calcium Latest Ref Range: 8.9 - 10.3 mg/dL 8.7 (L)  Anion gap Latest Ref Range: 5 - 15  10  Magnesium Latest Ref Range: 1.7 - 2.4 mg/dL 2.1  Alkaline Phosphatase Latest Ref Range: 38 - 126 U/L 83  Albumin Latest Ref Range: 3.5 - 5.0 g/dL 2.9 (L)  AST Latest Ref Range: 15 - 41 U/L 26  ALT Latest Ref Range: 0 - 44 U/L 18  Total Protein Latest Ref Range: 6.5 - 8.1 g/dL 7.5  Total Bilirubin Latest Ref Range: 0.3 - 1.2 mg/dL 1.2  GFR, Est Non African American Latest Ref Range: >60 mL/min >60  GFR, Est African American Latest Ref Range: >60 mL/min >60    Results for [facility], [place] (MRN [id number]) as of 04/28/2019 08:39  Ref. Range 04/27/2019 14:55  TSH Latest Ref Range: 0.35 - 4.50 uIU/mL <0.01 Repeated and verified X2. (L)  T4,Free(Direct) Latest Ref Range: 0.60 - 1.60 ng/dL 9.47 (H)    ASSESSMENT/PLAN/RECOMMENDATIONS:   1. Hyperthyroidism Secondary Most Likely to Graves' Disease:   - Pt with weight loss - No local neck symptoms  - Ran out of methimazole 2 months ago - Repeat labs consistent with hyperthyroidism  - We discussed that Graves' Disease is a result of an autoimmune condition involving the thyroid.    We discussed with pt the benefits of methimazole in the Tx of hyperthyroidism, as well as the possible side effects/complications of anti-thyroid drug Tx (specifically detailing the rare, but serious side effect of agranulocytosis). She was informed of need  for regular thyroid function monitoring while on methimazole to ensure appropriate dosage without over-treatment. As well, we discussed the possible side effects of methimazole including the chance of rash, the small chance of liver irritation/juandice and the <=1 in 300-400 chance of sudden onset agranulocytosis.  We discussed importance of going to ED promptly (and stopping methimazole) if shewere to develop significant fever with severe sore throat of other evidence of acute infection.     We extensively discussed the various treatment options for hyperthyroidism and Graves disease including ablation therapy with radioactive iodine versus antithyroid drug treatment versus surgical therapy.  We recommended to the patient that we felt, at this time, that thionamide therapy would be most optimal.  We discussed the various possible benefits versus side effects of the various therapies.   I have encouraged daughter to contact medicaid social worker to establish primary care services for mother  We discussed the importance of compliance with thionamide therapy due to increased risk of cardiac arrhythmia, osteoporosis and subsequent CHF and bone fractures with uncontrolled hyperthyroidism     Medications : Methimazole 10 mg BID  Labs in 6 weeks     F/U in 3 months    Addendum: Results and recommendations discussed with daughter on 04/28/19 @ 830 AM   Signed electronically by: Lyndle Herrlich, MD  [facility] Endocrinology  [facility] [address] [place] [place], [place] [address] Phone: [phone] FAX: [phone]   CC: Patient, No Pcp Per No address on file Phone: None Fax: None   Return to Endocrinology clinic as below: Future Appointments  Date Time Provider Department Center  06/08/2019 11:00 AM [facility] None  08/03/2019 11:10 AM Adelena Desantiago, Konrad Dolores, MD [facility] None

## 2019-04-28 ENCOUNTER — Encounter: Payer: Self-pay | Admitting: Internal Medicine

## 2019-04-28 DIAGNOSIS — E0590 Thyrotoxicosis, unspecified without thyrotoxic crisis or storm: Secondary | ICD-10-CM

## 2019-04-28 HISTORY — DX: Thyrotoxicosis, unspecified without thyrotoxic crisis or storm: E05.90

## 2019-04-28 MED ORDER — METHIMAZOLE 10 MG PO TABS: 10.0000 mg | ORAL_TABLET | Freq: Two times a day (BID) | ORAL | 2 refills | Status: DC

## 2019-04-29 LAB — TRAB (TSH RECEPTOR BINDING ANTIBODY): TRAB: 11.36 IU/L — ABNORMAL HIGH (ref <=2.00)

## 2019-06-08 ENCOUNTER — Other Ambulatory Visit: Payer: Medicaid Other

## 2019-06-29 NOTE — Progress Notes (Signed)
Virtual Visit via Video Note   This visit type was conducted due to national recommendations for restrictions regarding the COVID-19 Pandemic (e.g. social distancing) in an effort to limit this patient's exposure and mitigate transmission in our community.  Due to her co-morbid illnesses, this patient is at least at moderate risk for complications without adequate follow up.  This format is felt to be most appropriate for this patient at this time.  All issues noted in this document were discussed and addressed.  A limited physical exam was performed with this format.  Please refer to the patient's chart for her consent to telehealth for [facility].   Date:  07/02/2019   ID:  Shannon Simpson, DOB 1961/05/14, MRN [id number]  Patient Location:Home Provider Location: Home  PCP:  Patient, No Pcp Per  Cardiologist:  Dr Jens Som  Evaluation Performed:  Follow-Up Visit  Chief Complaint:  FU diastolic CHF  History of Present Illness:    FU CHF.  Last echocardiogram January 2020 showed normal LV systolic function, grade 1 diastolic dysfunction, mild mitral regurgitation and mild left atrial enlargement.  CTA January 2020 showed no pulmonary embolus and mildly dilated aortic root at 4 cm.  Recently admitted with hypertensive urgency and diastolic congestive heart failure.  She was not taking her medications.  Patient diuresed and blood pressure medications adjusted.  Patient also found to be hyperthyroid and referred to endocrinology.  Since last seen patient denies dyspnea, chest pain, palpitations or syncope.  Occasional minimal pedal edema.  She is not taking any of her blood pressure medications as she states she ran out.  The patient does not have symptoms concerning for COVID-19 infection (fever, chills, cough, or new shortness of breath).    Past Medical History:  Diagnosis Date  . CHF (congestive heart failure) (HCC)   . Chronic diastolic heart failure (HCC)   . Class 1 obesity  with body mass index (BMI) of 32.0 to 32.9 in adult   . Hypertension   . Microcytic anemia   . Mild pulmonary hypertension (HCC)   . Respiratory failure with hypoxia (HCC) 08/2017  . SIRS (systemic inflammatory response syndrome) (HCC) 08/2017   Past Surgical History:  Procedure Laterality Date  . APPENDECTOMY       Current Meds  Medication Sig  . acetaminophen (TYLENOL) 325 MG tablet Take 2 tablets (650 mg total) by mouth every 6 (six) hours as needed for mild pain, moderate pain, fever or headache (or Fever >/= 101).     Allergies:   Patient has no known allergies.   Social History   Tobacco Use  . Smoking status: Never Smoker  . Smokeless tobacco: Never Used  Substance Use Topics  . Alcohol use: No  . Drug use: No     Family Hx: The patient's family history includes Diabetes Mellitus II in her mother; Heart disease in an other family member.  ROS:   Please see the history of present illness.    No Fever, chills  or productive cough All other systems reviewed and are negative.   Recent Labs: 07/17/2018: B Natriuretic Peptide 920.2 07/18/2018: ALT 18 07/19/2018: Hemoglobin 13.4; Magnesium 1.8; Platelets 199 08/24/2018: BUN 13; Creatinine, Ser 0.49; Potassium 4.2; Sodium 138 04/27/2019: TSH <0.01 Repeated and verified X2.   Recent Lipid Panel Lab Results  Component Value Date/Time   CHOL 183 07/12/2015 04:47 AM   TRIG 97 07/12/2015 04:47 AM   HDL 43 07/12/2015 04:47 AM   CHOLHDL 4.3 07/12/2015 04:47 AM  LDLCALC 121 (H) 07/12/2015 04:47 AM    Wt Readings from Last 3 Encounters:  07/02/19 124 lb (56.2 kg)  04/27/19 124 lb 3.2 oz (56.3 kg)  08/24/18 143 lb 9.6 oz (65.1 kg)     Objective:    Vital Signs:  Ht 5\' 2"  (1.575 m)   Wt 124 lb (56.2 kg)   BMI 22.68 kg/m    VITAL SIGNS:  reviewed NAD Answers questions appropriately Normal affect Remainder of physical examination not performed (telehealth visit; coronavirus pandemic)  ASSESSMENT & PLAN:     1. Chronic diastolic congestive heart failure-patient not taking any of her medications.  Will resume Lasix at previous dose which was 20 mg daily.  Check potassium and renal function in 2 weeks. 2. Hypertension-patient not taking any of her medications.  Will resume amlodipine 5 mg daily and metoprolol 25 mg twice daily.  I will have her return to see an APP in 2 weeks to check her blood pressure and adjust regimen as needed. 3. Dilated aortic root-repeat CTA January 2021. 4. History of hyperthyroidism-per endocinology.  Patient does states she is taking methimazole.  COVID-19 Education: The importance of social distancing was discussed today.  Time:   Today, I have spent 16 minutes with the patient with telehealth technology discussing the above problems.     Medication Adjustments/Labs and Tests Ordered: Current medicines are reviewed at length with the patient today.  Concerns regarding medicines are outlined above.   Tests Ordered: No orders of the defined types were placed in this encounter.   Medication Changes: No orders of the defined types were placed in this encounter.   Follow Up:  Either In Person or Virtual in 1 year(s)  Signed, Kirk Ruths, MD  07/02/2019 10:09 AM    [name]

## 2019-07-02 ENCOUNTER — Telehealth (INDEPENDENT_AMBULATORY_CARE_PROVIDER_SITE_OTHER): Payer: Self-pay | Admitting: Cardiology

## 2019-07-02 ENCOUNTER — Encounter: Payer: Self-pay | Admitting: Cardiology

## 2019-07-02 VITALS — Ht 62.0 in | Wt 124.0 lb

## 2019-07-02 DIAGNOSIS — I10 Essential (primary) hypertension: Secondary | ICD-10-CM

## 2019-07-02 DIAGNOSIS — I110 Hypertensive heart disease with heart failure: Secondary | ICD-10-CM

## 2019-07-02 DIAGNOSIS — I5032 Chronic diastolic (congestive) heart failure: Secondary | ICD-10-CM

## 2019-07-02 DIAGNOSIS — I77810 Thoracic aortic ectasia: Secondary | ICD-10-CM

## 2019-07-02 MED ORDER — METOPROLOL TARTRATE 25 MG PO TABS: 25.0000 mg | ORAL_TABLET | Freq: Two times a day (BID) | ORAL | 3 refills | Status: DC

## 2019-07-02 MED ORDER — AMLODIPINE BESYLATE 5 MG PO TABS: 5.0000 mg | ORAL_TABLET | Freq: Every day | ORAL | 3 refills | Status: DC

## 2019-07-02 MED ORDER — FUROSEMIDE 20 MG PO TABS: 20.0000 mg | ORAL_TABLET | Freq: Every day | ORAL | 3 refills | Status: DC

## 2019-07-02 NOTE — Patient Instructions (Signed)
Medication Instructions:  START AMLODIPINE 5 MG ONCE DAILY  START FUROSEMIDE 20 MG ONCE DAILY  START METOPROLOL 25 MG TWICE DAILY  *If you need a refill on your cardiac medications before your next appointment, please call your pharmacy*  Lab Work: If you have labs (blood work) drawn today and your tests are completely normal, you will receive your results only by: Marland Kitchen MyChart Message (if you have MyChart) OR . A paper copy in the mail If you have any lab test that is abnormal or we need to change your treatment, we will call you to review the results.  Follow-Up: At [facility], you and your health needs are our priority.  As part of our continuing mission to provide you with exceptional heart care, we have created designated Provider Care Teams.  These Care Teams include your primary Cardiologist (physician) and Advanced Practice Providers (APPs -  Physician Assistants and Nurse Practitioners) who all work together to provide you with the care you need, when you need it.  Your next appointment:   2 week(s)  The format for your next appointment:   In Person  Provider:   You may see Kirk Ruths, MD or one of the following Advanced Practice Providers on your designated Care Team:    Kerin Ransom, PA-C  [place], [place]  Coletta Memos, [place]

## 2019-08-03 ENCOUNTER — Ambulatory Visit: Payer: Medicaid Other | Admitting: Internal Medicine

## 2019-08-24 ENCOUNTER — Ambulatory Visit: Payer: Medicaid Other | Admitting: Internal Medicine

## 2019-08-24 NOTE — Progress Notes (Deleted)
Name: Shannon Simpson  MRN/ DOB: [id number], 06/01/1961    Age/ Sex: 59 y.o., female     PCP: Patient, No Pcp Per   Reason for Endocrinology Evaluation: ***     Initial Endocrinology Clinic Visit: ***    PATIENT IDENTIFIER: Shannon Simpson is a 59 y.o., female with a past medical history of ***. She has followed with [facility] since *** for consultative assistance with management of her ***.   HISTORICAL SUMMARY: The patient was first diagnosed with *** at ***, in the setting of ***. Since that time, ***.    SUBJECTIVE:   During last visit (***): ***  Today (08/24/2019):  [name] is here for ****   ROS:  As per HPI.   HISTORY:  Past Medical History:  Past Medical History:  Diagnosis Date  . CHF (congestive heart failure) ([place])   . Chronic diastolic heart failure ([place])   . Class 1 obesity with body mass index (BMI) of 32.0 to 32.9 in adult   . Hypertension   . Microcytic anemia   . Mild pulmonary hypertension ([place])   . Respiratory failure with hypoxia ([place]) 08/2017  . SIRS (systemic inflammatory response syndrome) ([place]) 08/2017    Past Surgical History:  Past Surgical History:  Procedure Laterality Date  . APPENDECTOMY       Social History:  reports that she has never smoked. She has never used smokeless tobacco. She reports that she does not drink alcohol or use drugs. Family History:  Family History  Problem Relation Age of Onset  . Heart disease Other        No family history  . Diabetes Mellitus II Mother       HOME MEDICATIONS: Allergies as of 08/24/2019   No Known Allergies     Medication List       Accurate as of August 24, 2019 12:57 PM. If you have any questions, ask your nurse or doctor.        acetaminophen 325 MG tablet Commonly known as: TYLENOL Take 2 tablets (650 mg total) by mouth every 6 (six) hours as needed for mild pain, moderate pain, fever or headache (or Fever >/= 101).   amLODipine  5 MG tablet Commonly known as: NORVASC Take 1 tablet (5 mg total) by mouth daily.   benzonatate 200 MG capsule Commonly known as: TESSALON Take 1 capsule (200 mg total) by mouth 3 (three) times daily.   furosemide 20 MG tablet Commonly known as: LASIX Take 1 tablet (20 mg total) by mouth daily.   guaiFENesin-dextromethorphan 100-10 MG/5ML syrup Commonly known as: ROBITUSSIN DM Take 5 mLs by mouth every 6 (six) hours as needed for cough (chest congestion).   methimazole 10 MG tablet Commonly known as: TAPAZOLE Take 1 tablet (10 mg total) by mouth 2 (two) times daily.   metoprolol tartrate 25 MG tablet Commonly known as: LOPRESSOR Take 1 tablet (25 mg total) by mouth 2 (two) times daily.         OBJECTIVE:   PHYSICAL EXAM: VS: There were no vitals taken for this visit.   EXAM: General: Pt appears well and is in NAD  Hydration: Well-hydrated with moist mucous membranes and good skin turgor  Eyes: External eye exam normal without stare, lid lag or exophthalmos.  EOM intact.  PERRL.  Ears, Nose, Throat: Hearing: Grossly intact bilaterally Dental: Good dentition  Throat: Clear without mass, erythema or exudate  Neck: General: Supple without adenopathy. Thyroid: Thyroid size normal.  No goiter or nodules appreciated. No thyroid bruit.  Lungs: Clear with good BS bilat with no rales, rhonchi, or wheezes  Heart: Auscultation: RRR.  Abdomen: Normoactive bowel sounds, soft, nontender, without masses or organomegaly palpable  Extremities: Gait and station: Normal gait  Digits and nails: No clubbing, cyanosis, petechiae, or nodes Head and neck: Normal alignment and mobility BL UE: Normal ROM and strength. BL LE: No pretibial edema normal ROM and strength.  Skin: Hair: Texture and amount normal with gender appropriate distribution Skin Inspection: No rashes, acanthosis nigricans/skin tags. No lipohypertrophy Skin Palpation: Skin temperature, texture, and thickness normal to  palpation  Neuro: Cranial nerves: II - XII grossly intact  Cerebellar: Normal coordination and movement; no tremor Motor: Normal strength throughout DTRs: 2+ and symmetric in UE without delay in relaxation phase  Mental Status: Judgment, insight: Intact Orientation: Oriented to time, place, and person Memory: Intact for recent and remote events Mood and affect: No depression, anxiety, or agitation     DATA REVIEWED: ***    ASSESSMENT / PLAN / RECOMMENDATIONS:   1. ***  Plan:  ***    Medications   ***   Signed electronically by: Lyndle Herrlich, MD  [facility] Endocrinology  [facility] [address] [place] [place], [place] [address] Phone: [phone] FAX: [phone]      CC: Patient, No Pcp Per No address on file Phone: None  Fax: None   Return to Endocrinology clinic as below: Future Appointments  Date Time Provider Department Center  08/24/2019  3:00 PM Robynn Marcel, Konrad Dolores, MD [facility] None

## 2019-08-31 ENCOUNTER — Ambulatory Visit: Payer: Medicaid Other | Admitting: Internal Medicine

## 2019-08-31 ENCOUNTER — Encounter: Payer: Self-pay | Admitting: Internal Medicine

## 2019-08-31 NOTE — Progress Notes (Deleted)
Name: Shannon Simpson  MRN/ DOB: [id number], Dec 15, 1960    Age/ Sex: 59 y.o., female     PCP: Patient, No Pcp Per   Reason for Endocrinology Evaluation: ***     Initial Endocrinology Clinic Visit: 04/27/2019    PATIENT IDENTIFIER: Shannon Simpson is a 59 y.o., female with a past medical history of HTN and CHF. She has followed with [facility] since 04/27/2019 for consultative assistance with management of her hyperthyroidism   HISTORICAL SUMMARY: The patient was first noted to have a suppressed TSH < 0.01 uIU/mL in 07/2018 after she presented there with acute on chronic CHF episode. That's when she was started on Methimazole.    She is accompanied by her daughter "[name] " Who was the translator today   Lives with daughter   SUBJECTIVE:   During last visit (04/27/2019): Increased methimazole 10 mg 2 twice daily  Today (08/31/2019):  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   ROS:  As per HPI.   HISTORY:  Past Medical History:  Past Medical History:  Diagnosis Date  . CHF (congestive heart failure) ([place])   . Chronic diastolic heart failure ([place])   . Class 1 obesity with body mass index (BMI) of 32.0 to 32.9 in adult   . Hypertension   . Microcytic anemia   . Mild pulmonary hypertension ([place])   . Respiratory failure with hypoxia ([place]) 08/2017  . SIRS (systemic inflammatory response syndrome) ([place]) 08/2017    Past Surgical History:  Past Surgical History:  Procedure Laterality Date  . APPENDECTOMY       Social History:  reports that she has never smoked. She has never used smokeless tobacco. She reports that she does not drink alcohol or use drugs. Family History:  Family History  Problem Relation Age of Onset  . Heart disease Other        No family history  . Diabetes Mellitus II Mother       HOME MEDICATIONS: Allergies as of 08/31/2019   No Known Allergies     Medication  List       Accurate as of August 31, 2019  7:27 AM. If you have any questions, ask your nurse or doctor.        acetaminophen 325 MG tablet Commonly known as: TYLENOL Take 2 tablets (650 mg total) by mouth every 6 (six) hours as needed for mild pain, moderate pain, fever or headache (or Fever >/= 101).   amLODipine 5 MG tablet Commonly known as: NORVASC Take 1 tablet (5 mg total) by mouth daily.   benzonatate 200 MG capsule Commonly known as: TESSALON Take 1 capsule (200 mg total) by mouth 3 (three) times daily.   furosemide 20 MG tablet Commonly known as: LASIX Take 1 tablet (20 mg total) by mouth daily.   guaiFENesin-dextromethorphan 100-10 MG/5ML syrup Commonly known as: ROBITUSSIN DM Take 5 mLs by mouth every 6 (six) hours as needed for cough (chest congestion).   methimazole 10 MG tablet Commonly known as: TAPAZOLE Take 1 tablet (10 mg total) by mouth 2 (two) times daily.   metoprolol tartrate 25 MG tablet Commonly known as: LOPRESSOR Take 1 tablet (25 mg total) by mouth 2 (two) times daily.         OBJECTIVE:   PHYSICAL EXAM: VS: There were no vitals taken for this visit.   EXAM: General: Pt appears well and is in NAD  Hydration: Well-hydrated with moist mucous membranes  and good skin turgor  Eyes: External eye exam normal without stare, lid lag or exophthalmos.  EOM intact.  PERRL.  Ears, Nose, Throat: Hearing: Grossly intact bilaterally Dental: Good dentition  Throat: Clear without mass, erythema or exudate  Neck: General: Supple without adenopathy. Thyroid: Thyroid size normal.  No goiter or nodules appreciated. No thyroid bruit.  Lungs: Clear with good BS bilat with no rales, rhonchi, or wheezes  Heart: Auscultation: RRR.  Abdomen: Normoactive bowel sounds, soft, nontender, without masses or organomegaly palpable  Extremities: Gait and station: Normal gait  Digits and nails: No clubbing, cyanosis, petechiae, or nodes Head and neck: Normal  alignment and mobility BL UE: Normal ROM and strength. BL LE: No pretibial edema normal ROM and strength.  Skin: Hair: Texture and amount normal with gender appropriate distribution Skin Inspection: No rashes, acanthosis nigricans/skin tags. No lipohypertrophy Skin Palpation: Skin temperature, texture, and thickness normal to palpation  Neuro: Cranial nerves: II - XII grossly intact  Cerebellar: Normal coordination and movement; no tremor Motor: Normal strength throughout DTRs: 2+ and symmetric in UE without delay in relaxation phase  Mental Status: Judgment, insight: Intact Orientation: Oriented to time, place, and person Memory: Intact for recent and remote events Mood and affect: No depression, anxiety, or agitation     DATA REVIEWED: ***    ASSESSMENT / PLAN / RECOMMENDATIONS:   1. Hyperthyroidism secondary to Graves' disease  Plan:  ***    Medications  Methimazole  2.  Graves' disease  Patient with no extrathyroidal manifestations of Graves' disease   Signed electronically by: Lyndle Herrlich, MD  [facility] End[facility]  [facility] [address] [place] [place], [place] [address] Phone: [phone] FAX: [phone]      CC: Patient, No Pcp Per No address on file Phone: None  Fax: None   Return to Endocrinology clinic as below: Future Appointments  Date Time Provider Department Center  08/31/2019  9:50 AM Tawyna Pellot, Konrad Dolores, MD [facility] None

## 2019-09-07 ENCOUNTER — Telehealth: Payer: Self-pay | Admitting: Internal Medicine

## 2019-09-07 NOTE — Telephone Encounter (Signed)
Patient dismissed from [facility] by Lyndle Herrlich, MD effective 08/31/19 Dismissal Letter sent out by 1st class mail. [employer]

## 2020-06-05 IMAGING — DX DG CHEST 2V
2 series · 2 of 2 positions shown · non-contrast
Comparison: 09/04/2017 and 07/10/2015

CLINICAL DATA: Cough and congestion which shortness-of-breath 4
months. Right flank pain 1 week.

EXAM:
CHEST - 2 VIEW

[chest pa]
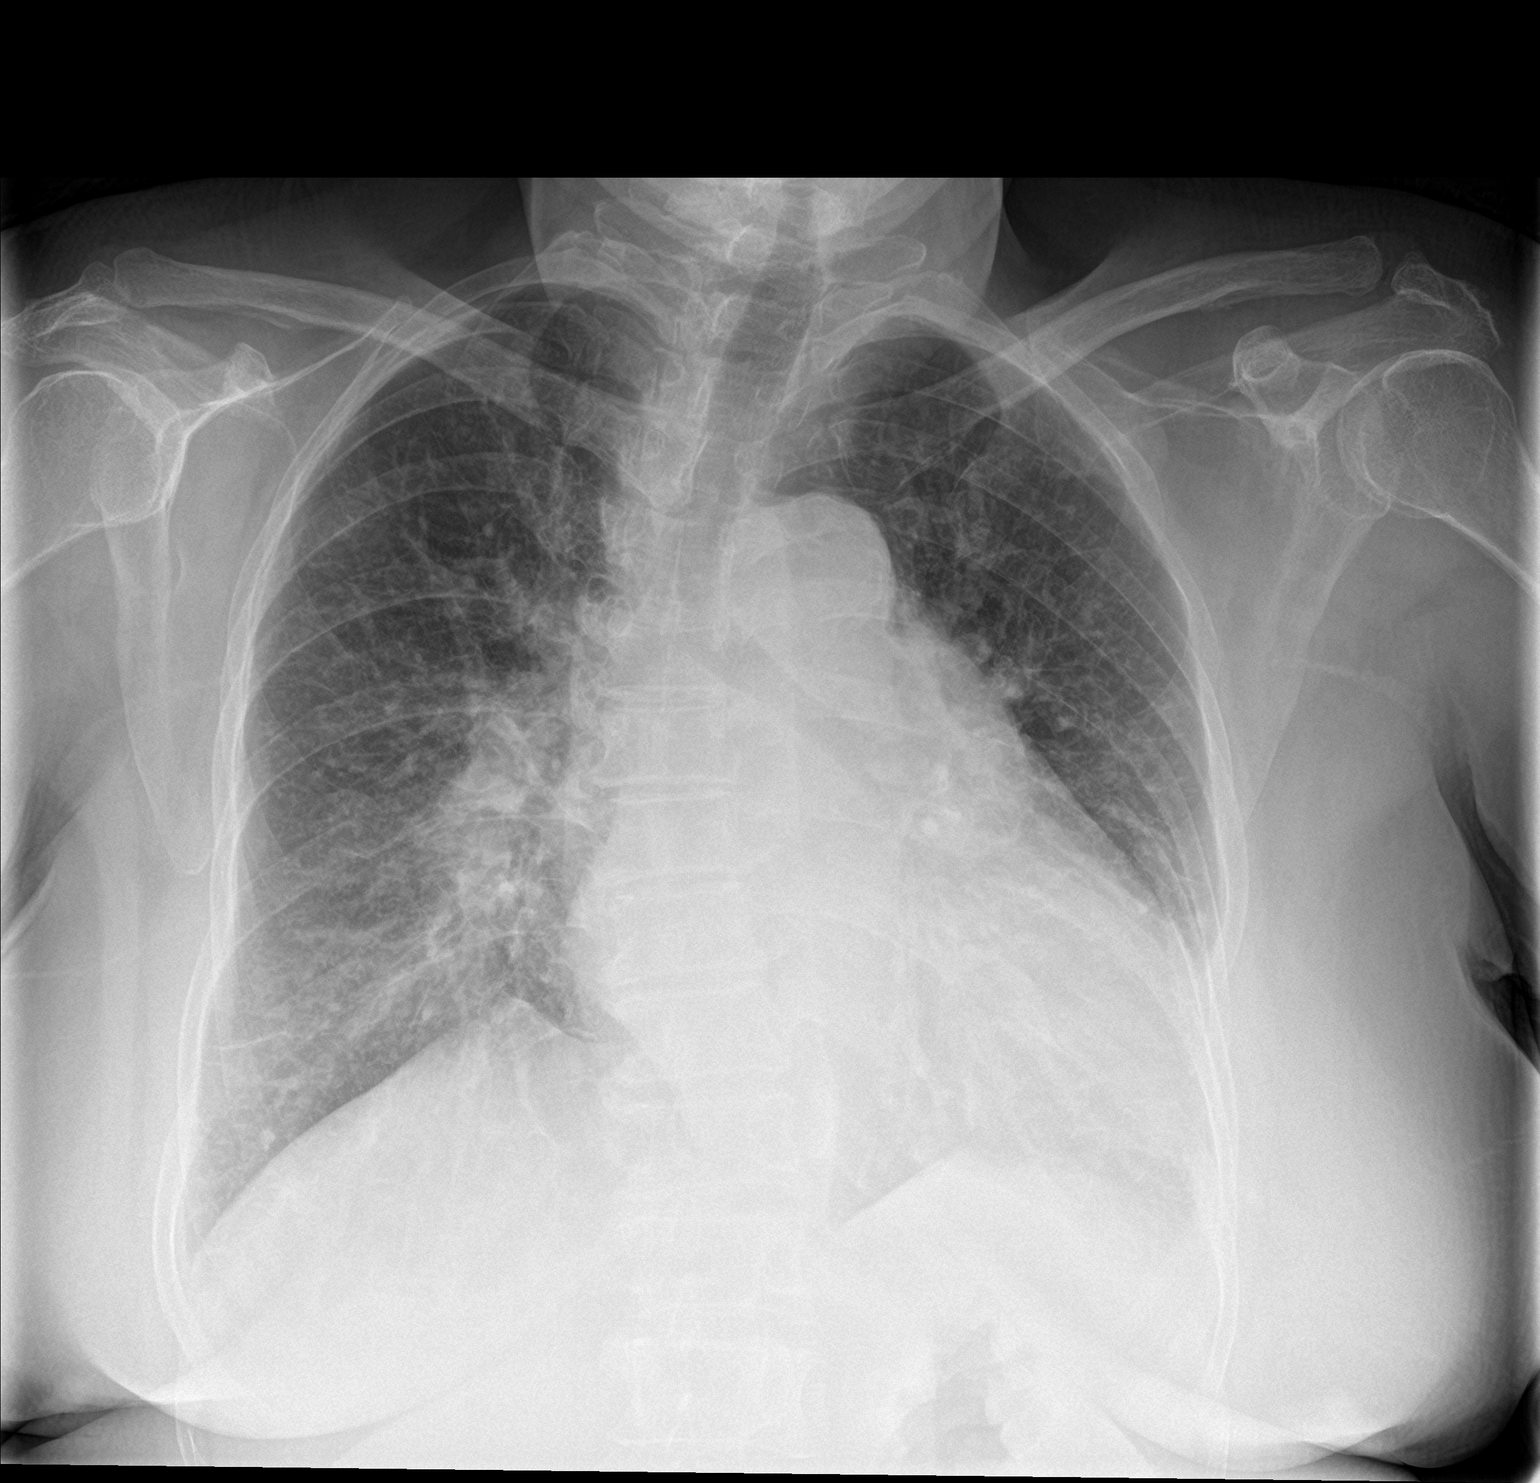

[chest lat]
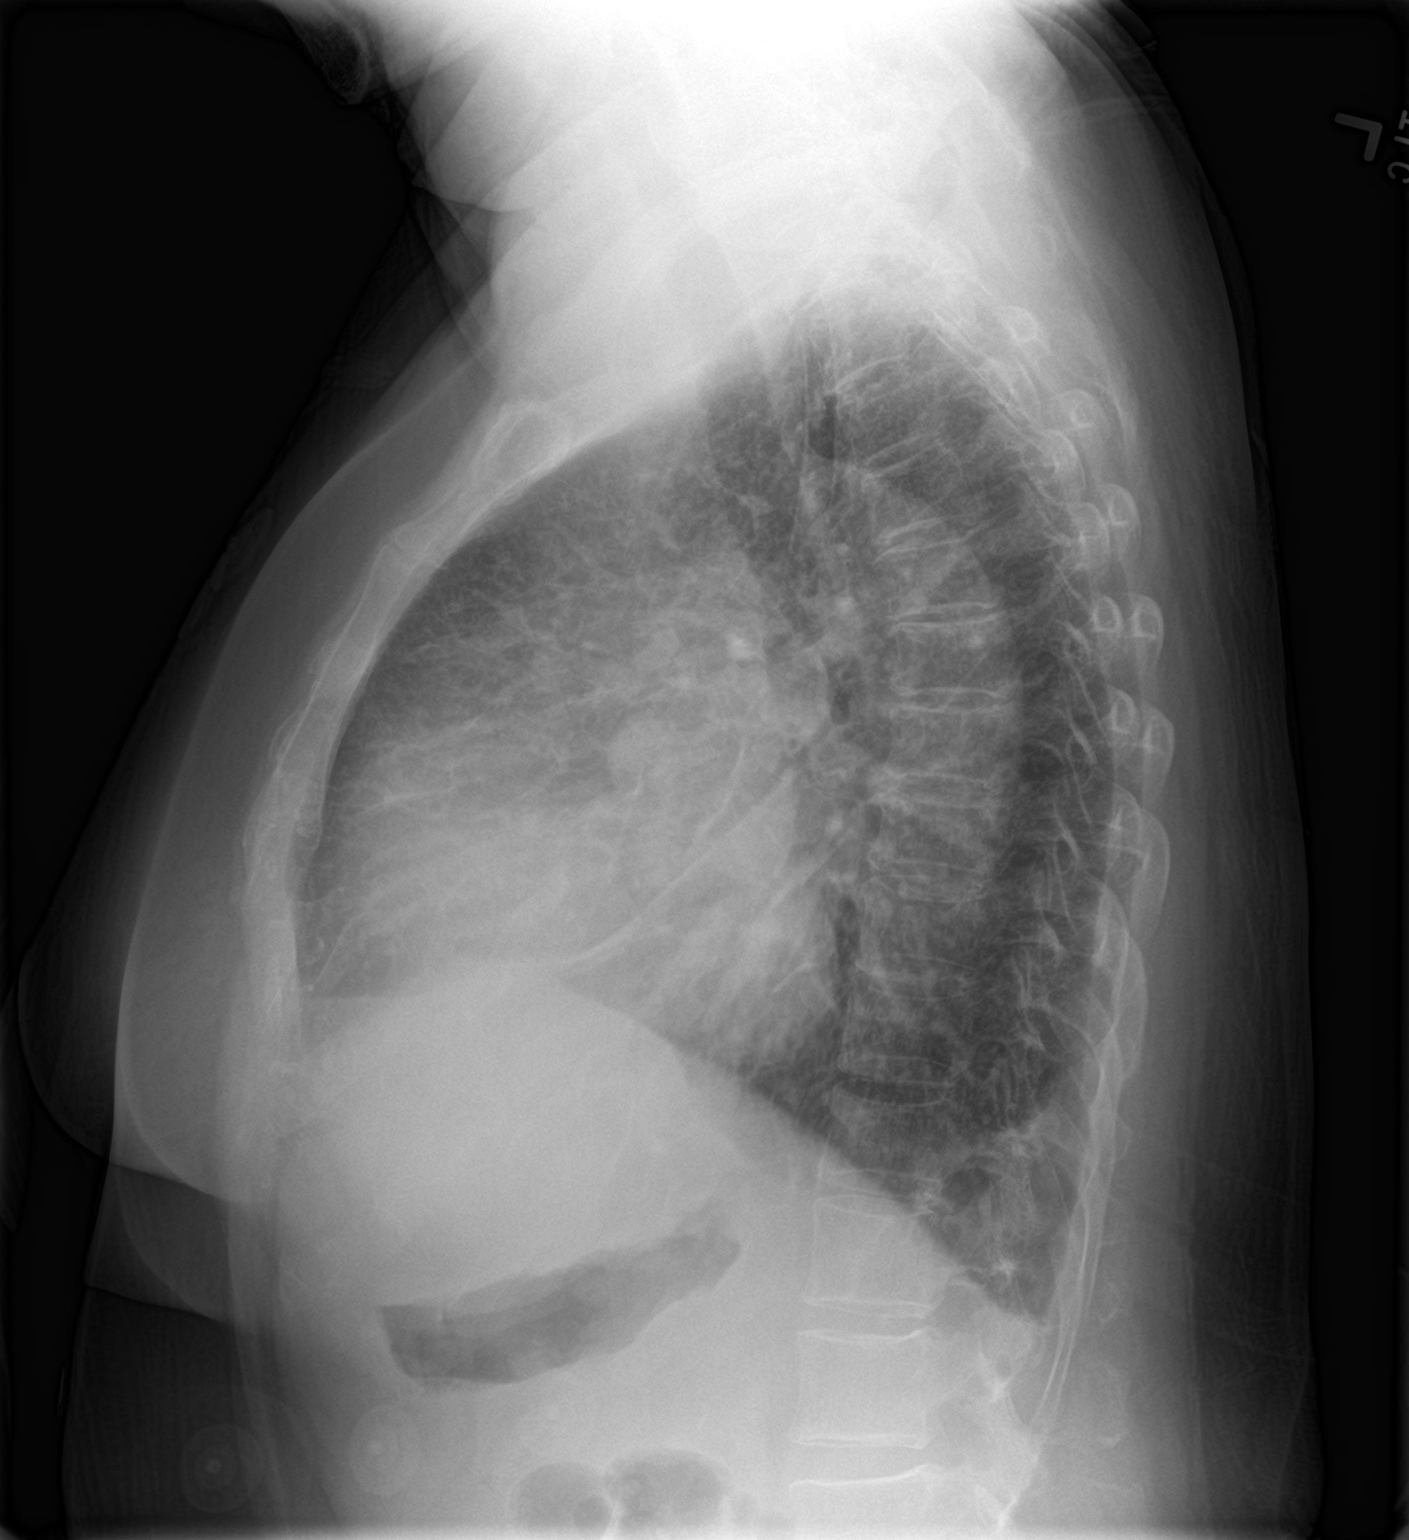

[2 of 2 positions shown; findings below may reference images not displayed]

FINDINGS: Patient slightly rotated to the left. Lungs are adequately inflated
without focal airspace consolidation or effusion. There is mild
prominence of the central perihilar markings suggesting mild
vascular congestion. Mild cardiomegaly is present. No significant
effusion. Remainder of the exam is unchanged.
IMPRESSION: Mild cardiomegaly with suggestion of mild vascular congestion.

## 2022-07-01 ENCOUNTER — Other Ambulatory Visit: Payer: Self-pay

## 2022-07-01 ENCOUNTER — Emergency Department (HOSPITAL_COMMUNITY): Payer: Medicaid Other

## 2022-07-01 ENCOUNTER — Inpatient Hospital Stay (HOSPITAL_COMMUNITY)
Admission: EM | Admit: 2022-07-01 | Discharge: 2022-07-06 | DRG: 177 | Disposition: A | Discharge status: Home / Self Care | Payer: Medicaid Other | Attending: Internal Medicine | Admitting: Internal Medicine

## 2022-07-01 DIAGNOSIS — Z833 Family history of diabetes mellitus: Secondary | ICD-10-CM

## 2022-07-01 DIAGNOSIS — Z79899 Other long term (current) drug therapy: Secondary | ICD-10-CM

## 2022-07-01 DIAGNOSIS — B974 Respiratory syncytial virus as the cause of diseases classified elsewhere: Secondary | ICD-10-CM | POA: Diagnosis present

## 2022-07-01 DIAGNOSIS — J811 Chronic pulmonary edema: Secondary | ICD-10-CM | POA: Diagnosis not present

## 2022-07-01 DIAGNOSIS — Z8249 Family history of ischemic heart disease and other diseases of the circulatory system: Secondary | ICD-10-CM

## 2022-07-01 DIAGNOSIS — E876 Hypokalemia: Secondary | ICD-10-CM | POA: Diagnosis present

## 2022-07-01 DIAGNOSIS — I272 Pulmonary hypertension, unspecified: Secondary | ICD-10-CM | POA: Diagnosis present

## 2022-07-01 DIAGNOSIS — I4891 Unspecified atrial fibrillation: Secondary | ICD-10-CM | POA: Diagnosis present

## 2022-07-01 DIAGNOSIS — Z91148 Patient's other noncompliance with medication regimen for other reason: Secondary | ICD-10-CM

## 2022-07-01 DIAGNOSIS — R739 Hyperglycemia, unspecified: Secondary | ICD-10-CM | POA: Diagnosis present

## 2022-07-01 DIAGNOSIS — J9601 Acute respiratory failure with hypoxia: Secondary | ICD-10-CM | POA: Diagnosis present

## 2022-07-01 DIAGNOSIS — E871 Hypo-osmolality and hyponatremia: Secondary | ICD-10-CM | POA: Diagnosis present

## 2022-07-01 DIAGNOSIS — B338 Other specified viral diseases: Secondary | ICD-10-CM

## 2022-07-01 DIAGNOSIS — R0602 Shortness of breath: Secondary | ICD-10-CM | POA: Diagnosis not present

## 2022-07-01 DIAGNOSIS — R0603 Acute respiratory distress: Secondary | ICD-10-CM | POA: Diagnosis present

## 2022-07-01 DIAGNOSIS — J690 Pneumonitis due to inhalation of food and vomit: Secondary | ICD-10-CM | POA: Diagnosis present

## 2022-07-01 DIAGNOSIS — I110 Hypertensive heart disease with heart failure: Secondary | ICD-10-CM | POA: Diagnosis present

## 2022-07-01 DIAGNOSIS — I5032 Chronic diastolic (congestive) heart failure: Secondary | ICD-10-CM | POA: Diagnosis present

## 2022-07-01 DIAGNOSIS — E0591 Thyrotoxicosis, unspecified with thyrotoxic crisis or storm: Secondary | ICD-10-CM | POA: Diagnosis not present

## 2022-07-01 DIAGNOSIS — J1282 Pneumonia due to coronavirus disease 2019: Secondary | ICD-10-CM | POA: Diagnosis not present

## 2022-07-01 DIAGNOSIS — U071 COVID-19: Secondary | ICD-10-CM | POA: Diagnosis not present

## 2022-07-01 DIAGNOSIS — E039 Hypothyroidism, unspecified: Secondary | ICD-10-CM | POA: Diagnosis present

## 2022-07-01 DIAGNOSIS — J189 Pneumonia, unspecified organism: Secondary | ICD-10-CM

## 2022-07-01 NOTE — ED Provider Triage Note (Signed)
  Emergency Medicine Provider Triage Evaluation Note  MRN:  [id number]  Arrival date & time: 07/01/22    Medically screening exam initiated at 11:07 PM.   CC:   SOB / Emesis    HPI:  Shannon Simpson is a 61 y.o. year-old female presents to the ED with chief complaint of RUQ pain.  Has been having postprandial vomiting for the past week.  Reports subjective fever at home.  Pain in epigastrium.  History provided by patient and family. ROS:  -As included in HPI PE:   Vitals:   07/01/22 2254  BP: (!) 120/94  Pulse: (!) 103  Resp: (!) 22  Temp: 98.7 F (37.1 C)  SpO2: 95%    Non-toxic appearing No respiratory distress tachycardic MDM:  Based on signs and symptoms, gallbladder disease is highest on my differential. I've ordered labs and imaging in triage to expedite lab/diagnostic workup.  Patient was informed that the remainder of the evaluation will be completed by another provider, this initial triage assessment does not replace that evaluation, and the importance of remaining in the ED until their evaluation is complete.   Roxy Horseman, PA-C 07/01/22 2309

## 2022-07-01 NOTE — ED Triage Notes (Signed)
Patient reports SOB with cough , chills and emesis this week .

## 2022-07-02 ENCOUNTER — Emergency Department (HOSPITAL_COMMUNITY): Payer: Medicaid Other

## 2022-07-02 ENCOUNTER — Other Ambulatory Visit (HOSPITAL_COMMUNITY): Payer: Self-pay

## 2022-07-02 ENCOUNTER — Inpatient Hospital Stay (HOSPITAL_COMMUNITY): Payer: Medicaid Other

## 2022-07-02 ENCOUNTER — Encounter (HOSPITAL_COMMUNITY): Payer: Self-pay

## 2022-07-02 DIAGNOSIS — I110 Hypertensive heart disease with heart failure: Secondary | ICD-10-CM | POA: Diagnosis not present

## 2022-07-02 DIAGNOSIS — R1011 Right upper quadrant pain: Secondary | ICD-10-CM | POA: Diagnosis not present

## 2022-07-02 DIAGNOSIS — Z833 Family history of diabetes mellitus: Secondary | ICD-10-CM | POA: Diagnosis not present

## 2022-07-02 DIAGNOSIS — I4891 Unspecified atrial fibrillation: Secondary | ICD-10-CM | POA: Diagnosis not present

## 2022-07-02 DIAGNOSIS — Z8249 Family history of ischemic heart disease and other diseases of the circulatory system: Secondary | ICD-10-CM | POA: Diagnosis not present

## 2022-07-02 DIAGNOSIS — I5032 Chronic diastolic (congestive) heart failure: Secondary | ICD-10-CM | POA: Diagnosis not present

## 2022-07-02 DIAGNOSIS — R0603 Acute respiratory distress: Secondary | ICD-10-CM | POA: Diagnosis not present

## 2022-07-02 DIAGNOSIS — E039 Hypothyroidism, unspecified: Secondary | ICD-10-CM | POA: Diagnosis not present

## 2022-07-02 DIAGNOSIS — Z79899 Other long term (current) drug therapy: Secondary | ICD-10-CM | POA: Diagnosis not present

## 2022-07-02 DIAGNOSIS — J1282 Pneumonia due to coronavirus disease 2019: Secondary | ICD-10-CM | POA: Diagnosis not present

## 2022-07-02 DIAGNOSIS — J811 Chronic pulmonary edema: Secondary | ICD-10-CM | POA: Diagnosis not present

## 2022-07-02 DIAGNOSIS — U071 COVID-19: Secondary | ICD-10-CM | POA: Diagnosis not present

## 2022-07-02 DIAGNOSIS — E871 Hypo-osmolality and hyponatremia: Secondary | ICD-10-CM | POA: Diagnosis not present

## 2022-07-02 DIAGNOSIS — I272 Pulmonary hypertension, unspecified: Secondary | ICD-10-CM | POA: Diagnosis not present

## 2022-07-02 DIAGNOSIS — R739 Hyperglycemia, unspecified: Secondary | ICD-10-CM | POA: Diagnosis not present

## 2022-07-02 DIAGNOSIS — J690 Pneumonitis due to inhalation of food and vomit: Secondary | ICD-10-CM | POA: Diagnosis not present

## 2022-07-02 DIAGNOSIS — J9601 Acute respiratory failure with hypoxia: Secondary | ICD-10-CM | POA: Diagnosis not present

## 2022-07-02 DIAGNOSIS — E876 Hypokalemia: Secondary | ICD-10-CM | POA: Diagnosis not present

## 2022-07-02 DIAGNOSIS — E0591 Thyrotoxicosis, unspecified with thyrotoxic crisis or storm: Secondary | ICD-10-CM | POA: Diagnosis not present

## 2022-07-02 DIAGNOSIS — Z91148 Patient's other noncompliance with medication regimen for other reason: Secondary | ICD-10-CM | POA: Diagnosis not present

## 2022-07-02 DIAGNOSIS — R0602 Shortness of breath: Secondary | ICD-10-CM | POA: Diagnosis not present

## 2022-07-02 DIAGNOSIS — B974 Respiratory syncytial virus as the cause of diseases classified elsewhere: Secondary | ICD-10-CM | POA: Diagnosis not present

## 2022-07-02 LAB — CBC WITH DIFFERENTIAL/PLATELET
Abs Immature Granulocytes: 0 10*3/uL (ref 0.00–0.07)
Basophils Absolute: 0.3 10*3/uL — ABNORMAL HIGH (ref 0.0–0.1)
Basophils Relative: 1 %
Eosinophils Absolute: 0 10*3/uL (ref 0.0–0.5)
Eosinophils Relative: 0 %
HCT: 35.1 % — ABNORMAL LOW (ref 36.0–46.0)
Hemoglobin: 12.5 g/dL (ref 12.0–15.0)
Lymphocytes Relative: 26 %
Lymphs Abs: 7.2 10*3/uL — ABNORMAL HIGH (ref 0.7–4.0)
MCH: 20.6 pg — ABNORMAL LOW (ref 26.0–34.0)
MCHC: 35.6 g/dL (ref 30.0–36.0)
MCV: 57.9 fL — ABNORMAL LOW (ref 80.0–100.0)
Monocytes Absolute: 1.1 10*3/uL — ABNORMAL HIGH (ref 0.1–1.0)
Monocytes Relative: 4 %
Neutro Abs: 19 10*3/uL — ABNORMAL HIGH (ref 1.7–7.7)
Neutrophils Relative %: 69 %
Platelets: 220 10*3/uL (ref 150–400)
RBC: 6.06 MIL/uL — ABNORMAL HIGH (ref 3.87–5.11)
RDW: 16.3 % — ABNORMAL HIGH (ref 11.5–15.5)
WBC: 27.6 10*3/uL — ABNORMAL HIGH (ref 4.0–10.5)
nRBC: 0 % (ref 0.0–0.2)
nRBC: 0 /100 WBC

## 2022-07-02 LAB — RESP PANEL BY RT-PCR (RSV, FLU A&B, COVID)  RVPGX2
Influenza A by PCR: NEGATIVE
Influenza B by PCR: NEGATIVE
Resp Syncytial Virus by PCR: POSITIVE — AB
SARS Coronavirus 2 by RT PCR: POSITIVE — AB

## 2022-07-02 LAB — CBC
HCT: 32.5 % — ABNORMAL LOW (ref 36.0–46.0)
Hemoglobin: 11.2 g/dL — ABNORMAL LOW (ref 12.0–15.0)
MCH: 20.1 pg — ABNORMAL LOW (ref 26.0–34.0)
MCHC: 34.5 g/dL (ref 30.0–36.0)
MCV: 58.2 fL — ABNORMAL LOW (ref 80.0–100.0)
Platelets: 209 10*3/uL (ref 150–400)
RBC: 5.58 MIL/uL — ABNORMAL HIGH (ref 3.87–5.11)
RDW: 15.9 % — ABNORMAL HIGH (ref 11.5–15.5)
WBC: 25.8 10*3/uL — ABNORMAL HIGH (ref 4.0–10.5)
nRBC: 0 % (ref 0.0–0.2)

## 2022-07-02 LAB — COMPREHENSIVE METABOLIC PANEL
ALT: 13 U/L (ref 0–44)
AST: 20 U/L (ref 15–41)
Albumin: 2.5 g/dL — ABNORMAL LOW (ref 3.5–5.0)
Alkaline Phosphatase: 90 U/L (ref 38–126)
Anion gap: 13 (ref 5–15)
BUN: 17 mg/dL (ref 6–20)
CO2: 26 mmol/L (ref 22–32)
Calcium: 8.5 mg/dL — ABNORMAL LOW (ref 8.9–10.3)
Chloride: 91 mmol/L — ABNORMAL LOW (ref 98–111)
Creatinine, Ser: 0.7 mg/dL (ref 0.44–1.00)
GFR, Estimated: >60 mL/min (ref >60)
Glucose, Bld: 246 mg/dL — ABNORMAL HIGH (ref 70–99)
Potassium: 2.6 mmol/L — CL (ref 3.5–5.1)
Sodium: 130 mmol/L — ABNORMAL LOW (ref 135–145)
Total Bilirubin: 2.4 mg/dL — ABNORMAL HIGH (ref 0.3–1.2)
Total Protein: 7.6 g/dL (ref 6.5–8.1)

## 2022-07-02 LAB — ECHOCARDIOGRAM COMPLETE
Area-P 1/2: 4.36 cm2
Calc EF: 43.9 %
MV M vel: 4.66 m/s
MV Peak grad: 86.9 mmHg
MV VTI: 2.27 cm2
P 1/2 time: 452 msec
S' Lateral: 3.2 cm
Single Plane A2C EF: 41.2 %
Single Plane A4C EF: 43 %

## 2022-07-02 LAB — URINALYSIS, ROUTINE W REFLEX MICROSCOPIC
Bacteria, UA: NONE SEEN
Bilirubin Urine: NEGATIVE
Glucose, UA: 50 mg/dL — AB
Ketones, ur: 5 mg/dL — AB
Leukocytes,Ua: NEGATIVE
Nitrite: NEGATIVE
Protein, ur: 100 mg/dL — AB
Specific Gravity, Urine: 1.018 (ref 1.005–1.030)
pH: 5 (ref 5.0–8.0)

## 2022-07-02 LAB — GLUCOSE, CAPILLARY
Glucose-Capillary: 127 mg/dL — ABNORMAL HIGH (ref 70–99)
Glucose-Capillary: 121 mg/dL — ABNORMAL HIGH (ref 70–99)
Glucose-Capillary: 261 mg/dL — ABNORMAL HIGH (ref 70–99)
Glucose-Capillary: 274 mg/dL — ABNORMAL HIGH (ref 70–99)

## 2022-07-02 LAB — TROPONIN I (HIGH SENSITIVITY): Troponin I (High Sensitivity): 43 ng/L — ABNORMAL HIGH (ref <18)

## 2022-07-02 LAB — HIV ANTIBODY (ROUTINE TESTING W REFLEX): HIV Screen 4th Generation wRfx: NONREACTIVE

## 2022-07-02 LAB — MRSA NEXT GEN BY PCR, NASAL: MRSA by PCR Next Gen: NOT DETECTED

## 2022-07-02 LAB — BASIC METABOLIC PANEL
Anion gap: 12 (ref 5–15)
BUN: 16 mg/dL (ref 6–20)
CO2: 27 mmol/L (ref 22–32)
Calcium: 8.2 mg/dL — ABNORMAL LOW (ref 8.9–10.3)
Chloride: 90 mmol/L — ABNORMAL LOW (ref 98–111)
Creatinine, Ser: 0.66 mg/dL (ref 0.44–1.00)
GFR, Estimated: >60 mL/min (ref >60)
Glucose, Bld: 254 mg/dL — ABNORMAL HIGH (ref 70–99)
Potassium: 2.9 mmol/L — ABNORMAL LOW (ref 3.5–5.1)
Sodium: 129 mmol/L — ABNORMAL LOW (ref 135–145)

## 2022-07-02 LAB — CBG MONITORING, ED: Glucose-Capillary: 317 mg/dL — ABNORMAL HIGH (ref 70–99)

## 2022-07-02 LAB — I-STAT BETA HCG BLOOD, ED (MC, WL, AP ONLY): I-stat hCG, quantitative: <5 m[IU]/mL (ref <5)

## 2022-07-02 LAB — MAGNESIUM: Magnesium: 1.8 mg/dL (ref 1.7–2.4)

## 2022-07-02 LAB — BRAIN NATRIURETIC PEPTIDE: B Natriuretic Peptide: 439.6 pg/mL — ABNORMAL HIGH (ref 0.0–100.0)

## 2022-07-02 LAB — LIPASE, BLOOD: Lipase: 28 U/L (ref 11–51)

## 2022-07-02 LAB — SEDIMENTATION RATE: Sed Rate: 65 mm/hr — ABNORMAL HIGH (ref 0–22)

## 2022-07-02 LAB — C-REACTIVE PROTEIN: CRP: 12.5 mg/dL — ABNORMAL HIGH (ref <1.0)

## 2022-07-02 LAB — T4, FREE: Free T4: >5.5 ng/dL — ABNORMAL HIGH (ref 0.61–1.12)

## 2022-07-02 LAB — LACTIC ACID, PLASMA: Lactic Acid, Venous: 1.7 mmol/L (ref 0.5–1.9)

## 2022-07-02 LAB — TSH: TSH: <0.01 u[IU]/mL — ABNORMAL LOW (ref 0.350–4.500)

## 2022-07-02 MED ORDER — HYDROCORTISONE SOD SUC (PF) 100 MG IJ SOLR
100.0000 mg | Freq: Four times a day (QID) | INTRAMUSCULAR | Status: DC
  Administered 2022-07-02: 100 mg via INTRAVENOUS
  Filled 2022-07-02: qty 2

## 2022-07-02 MED ORDER — SODIUM CHLORIDE 0.9 % IV SOLN
1.0000 g | Freq: Once | INTRAVENOUS | Status: AC
  Administered 2022-07-02: 1 g via INTRAVENOUS
  Filled 2022-07-02: qty 10

## 2022-07-02 MED ORDER — DOCUSATE SODIUM 100 MG PO CAPS: 100.0000 mg | ORAL_CAPSULE | Freq: Two times a day (BID) | ORAL | Status: DC | PRN

## 2022-07-02 MED ORDER — HYDROCORTISONE SOD SUC (PF) 100 MG IJ SOLR
100.0000 mg | Freq: Three times a day (TID) | INTRAMUSCULAR | Status: DC
  Administered 2022-07-02 – 2022-07-04 (×6): 100 mg via INTRAVENOUS
  Filled 2022-07-02 (×8): qty 2

## 2022-07-02 MED ORDER — PROPRANOLOL HCL 40 MG PO TABS
40.0000 mg | ORAL_TABLET | Freq: Four times a day (QID) | ORAL | Status: DC
  Administered 2022-07-02 – 2022-07-06 (×16): 40 mg via ORAL
  Filled 2022-07-02 (×21): qty 1

## 2022-07-02 MED ORDER — AMIODARONE HCL IN DEXTROSE 360-4.14 MG/200ML-% IV SOLN
60.0000 mg/h | INTRAVENOUS | Status: AC
  Administered 2022-07-02 (×2): 60 mg/h via INTRAVENOUS
  Filled 2022-07-02 (×2): qty 200

## 2022-07-02 MED ORDER — AMIODARONE LOAD VIA INFUSION
150.0000 mg | Freq: Once | INTRAVENOUS | Status: AC
  Administered 2022-07-02: 150 mg via INTRAVENOUS
  Filled 2022-07-02: qty 83.34

## 2022-07-02 MED ORDER — POTASSIUM CHLORIDE CRYS ER 20 MEQ PO TBCR
40.0000 meq | EXTENDED_RELEASE_TABLET | Freq: Once | ORAL | Status: AC
  Administered 2022-07-02: 40 meq via ORAL
  Filled 2022-07-02: qty 2

## 2022-07-02 MED ORDER — POTASSIUM & SODIUM PHOSPHATES 280-160-250 MG PO PACK
1.0000 | PACK | Freq: Once | ORAL | Status: AC
  Administered 2022-07-02: 1 via ORAL
  Filled 2022-07-02: qty 1

## 2022-07-02 MED ORDER — MAGNESIUM SULFATE 2 GM/50ML IV SOLN
2.0000 g | Freq: Once | INTRAVENOUS | Status: AC
  Administered 2022-07-02: 2 g via INTRAVENOUS
  Filled 2022-07-02: qty 50

## 2022-07-02 MED ORDER — PROPRANOLOL HCL 10 MG PO TABS
60.0000 mg | ORAL_TABLET | Freq: Once | ORAL | Status: AC
  Administered 2022-07-02: 60 mg via ORAL
  Filled 2022-07-02: qty 6

## 2022-07-02 MED ORDER — SODIUM CHLORIDE 0.9 % IV SOLN
500.0000 mg | INTRAVENOUS | Status: DC
  Administered 2022-07-02 – 2022-07-06 (×5): 500 mg via INTRAVENOUS
  Filled 2022-07-02 (×6): qty 5

## 2022-07-02 MED ORDER — INSULIN ASPART 100 UNIT/ML IJ SOLN
0.0000 [IU] | Freq: Every day | INTRAMUSCULAR | Status: DC
  Administered 2022-07-02: 3 [IU] via SUBCUTANEOUS

## 2022-07-02 MED ORDER — ENOXAPARIN SODIUM 40 MG/0.4ML IJ SOSY
40.0000 mg | PREFILLED_SYRINGE | Freq: Every day | INTRAMUSCULAR | Status: DC
  Administered 2022-07-02 – 2022-07-06 (×5): 40 mg via SUBCUTANEOUS
  Filled 2022-07-02 (×6): qty 0.4

## 2022-07-02 MED ORDER — SODIUM CHLORIDE 0.9 % IV SOLN
2.0000 g | INTRAVENOUS | Status: DC
  Administered 2022-07-02 – 2022-07-05 (×4): 2 g via INTRAVENOUS
  Filled 2022-07-02 (×4): qty 20

## 2022-07-02 MED ORDER — DOXYCYCLINE HYCLATE 100 MG PO TABS
100.0000 mg | ORAL_TABLET | Freq: Once | ORAL | Status: AC
  Administered 2022-07-02: 100 mg via ORAL
  Filled 2022-07-02: qty 1

## 2022-07-02 MED ORDER — POTASSIUM CHLORIDE 10 MEQ/100ML IV SOLN
10.0000 meq | INTRAVENOUS | Status: AC
  Administered 2022-07-02 (×4): 10 meq via INTRAVENOUS
  Filled 2022-07-02 (×4): qty 100

## 2022-07-02 MED ORDER — INSULIN ASPART 100 UNIT/ML IJ SOLN
0.0000 [IU] | Freq: Three times a day (TID) | INTRAMUSCULAR | Status: DC
  Administered 2022-07-02 (×2): 3 [IU] via SUBCUTANEOUS
  Administered 2022-07-03: 15 [IU] via SUBCUTANEOUS
  Administered 2022-07-03: 4 [IU] via SUBCUTANEOUS

## 2022-07-02 MED ORDER — METHIMAZOLE 10 MG PO TABS
10.0000 mg | ORAL_TABLET | Freq: Two times a day (BID) | ORAL | Status: DC
  Administered 2022-07-02 – 2022-07-06 (×9): 10 mg via ORAL
  Filled 2022-07-02 (×11): qty 1

## 2022-07-02 MED ORDER — SODIUM CHLORIDE 0.9 % IV SOLN: 1.0000 g | INTRAVENOUS | Status: DC

## 2022-07-02 MED ORDER — AMIODARONE HCL IN DEXTROSE 360-4.14 MG/200ML-% IV SOLN
30.0000 mg/h | INTRAVENOUS | Status: DC
  Administered 2022-07-02: 30 mg/h via INTRAVENOUS

## 2022-07-02 MED ORDER — POLYETHYLENE GLYCOL 3350 17 G PO PACK: 17.0000 g | PACK | Freq: Every day | ORAL | Status: DC | PRN

## 2022-07-02 MED ORDER — INSULIN ASPART 100 UNIT/ML IJ SOLN
0.0000 [IU] | Freq: Three times a day (TID) | INTRAMUSCULAR | Status: DC
  Administered 2022-07-02: 11 [IU] via SUBCUTANEOUS

## 2022-07-02 MED ORDER — ORAL CARE MOUTH RINSE: 15.0000 mL | OROMUCOSAL | Status: DC | PRN

## 2022-07-02 MED ORDER — ONDANSETRON HCL 4 MG/2ML IJ SOLN: 4.0000 mg | Freq: Four times a day (QID) | INTRAMUSCULAR | Status: DC | PRN

## 2022-07-02 MED ORDER — CHLORHEXIDINE GLUCONATE CLOTH 2 % EX PADS
6.0000 | MEDICATED_PAD | Freq: Every day | CUTANEOUS | Status: DC
  Administered 2022-07-02 – 2022-07-05 (×4): 6 via TOPICAL

## 2022-07-02 MED ORDER — POTASSIUM CHLORIDE 10 MEQ/100ML IV SOLN
10.0000 meq | INTRAVENOUS | Status: AC
  Administered 2022-07-02 (×2): 10 meq via INTRAVENOUS
  Filled 2022-07-02 (×2): qty 100

## 2022-07-02 MED ORDER — LACTATED RINGERS IV BOLUS
1000.0000 mL | Freq: Once | INTRAVENOUS | Status: AC
  Administered 2022-07-02: 1000 mL via INTRAVENOUS

## 2022-07-02 NOTE — H&P (Addendum)
NAMELesa Vandall, MRN:  [id number], DOB:  01/14/1961, LOS: 0 ADMISSION DATE:  07/01/2022, CONSULTATION DATE:  07/02/22 REFERRING MD:  Eudelia Bunch, CHIEF COMPLAINT:  shortness of breath   History of Present Illness:  Ms. Annamarie Major is a 61 y.o. Chad F with PMH significant for HFpEF, HTN, pulm HTN, hyperthyroidism who presented to the ED with one week of nausea and vomiting and several days of cough with generalized weakness and dyspnea.   Pt speaks some English and daughter is at the bedside to help interpret, states no fever, leg edema, chest pain, or rigors at home, is not a smoker.  She stopped going to endocrinology some time ago because of cost, but says she has still been taking what Tapazole she has left.  She was in atrial fibrillation on arrival with stable BP and required [place] oxygen.  CXR showed mild vascular congestion and RLL airspace opacity.   Labs significant for Na 130, K 2.6, WBC 27k, TSH <0.010, T4 >5.5.  She was also both Covid-19 and RSV positive.    She was given propranolol, Doxycyline and Ceftriaxone and started on amiodarone gtt.  PCCM consulted for admission given concern for thyroid storm.   Pertinent  Medical History   has a past medical history of CHF (congestive heart failure) (HCC), Chronic diastolic heart failure (HCC), Class 1 obesity with body mass index (BMI) of 32.0 to 32.9 in adult, Hypertension, Microcytic anemia, Mild pulmonary hypertension (HCC), Respiratory failure with hypoxia (HCC) (08/2017), and SIRS (systemic inflammatory response syndrome) (HCC) (08/2017).   Significant Hospital Events: Including procedures, antibiotic start and stop dates in addition to other pertinent events   12/18 presented to the ED with n/v, respiratory distress   Interim History / Subjective:  Pt seen calm, resting in no distress on 2L [place], no pressor requirement  Objective   Blood pressure 126/84, pulse 93, temperature 99.1 F (37.3 C), temperature source Oral, resp.  rate (!) 21, SpO2 93 %.        Intake/Output Summary (Last 24 hours) at 07/02/2022 0535 Last data filed at 07/02/2022 0145 Gross per 24 hour  Intake 100 ml  Output --  Net 100 ml   There were no vitals filed for this visit.  General:  well nourished F in no acute distress HEENT: MM pink/moist, pupils equal, sclera anicteric Neuro: awake, alert, moving all extremities and oriented CV: s1s2 tachycardic, irregular, no m/r/g PULM:  mild tachypnea and decreased air entry bilaterally without rhonchi, wheezing or accessory muscle use GI: soft, bsx4 active  Extremities: warm/dry, no edema  Skin: no rashes or lesions   Resolved Hospital Problem list     Assessment & Plan:     Acute Hypoxic Respiratory Failure  Secondary to CAP vs aspiration, Covid-19 and RSV -supplement O2 to maintain sats >92% -steroids, CAP coverage with ceftriaxone and azithromycin -check CRP and sed rate, no lactic acidosis -IS -blood cultures pending   Hyperthyroidism Concern for thyroid storm Pt has been admitted with similar labs and non-compliance with medications, suspect this is somewhat chronic as she has not seen endocrinology for some time and has been making her remaining Tapazole last -she is in no distress, no high fever, agitation, jaundice or AMS, doubt severe thyroid stom -admit to ICU for close monitoring  -start hydrocortisone 100mg  q6hr, received propranolol -resume Tapazole      Atrial Fibrillation History HFpEF HTN -continue amiodarone gtt -transition to home metoprolol when able -consider anti-coagulation if does not convert to sinus  -  suspect she is also volume depleted, no fluids given thus far, 1 L LR now -echo  Hyponatremia Hypokalemia  -given 50meq in the ED, give po K and repeat BMP -Magnesium level pending -monitor and replete as needed -Na 130, IVF and repeat   Nausea/vomiting Without significant abdominal pain or diarrhea -able to tolerate po's in the  ED -RUQ US unremarkable and lipase WNL -zofran prn -clear liquids, advance as tolerated   Hyperglycemia -SSI  Best Practice (right click and "Reselect all SmartList Selections" daily)   Diet/type: clear liquids DVT prophylaxis: LMWH GI prophylaxis: N/A Lines: N/A Foley:  N/A Code Status:  full code Last date of multidisciplinary goals of care discussion [pending, plan discussed with patient and daughter at the bedside]  Labs   CBC: Recent Labs  Lab 07/02/22 0105  WBC 27.6*  NEUTROABS 19.0*  HGB 12.5  HCT 35.1*  MCV 57.9*  PLT [id number]    Basic Metabolic Panel: Recent Labs  Lab 07/02/22 0105  NA 130*  K 2.6*  CL 91*  CO2 26  GLUCOSE 246*  BUN 17  CREATININE 0.70  CALCIUM 8.5*   GFR: CrCl cannot be calculated (Unknown ideal weight.). Recent Labs  Lab 07/02/22 0105  WBC 27.6*  LATICACIDVEN 1.7    Liver Function Tests: Recent Labs  Lab 07/02/22 0105  AST 20  ALT 13  ALKPHOS 90  BILITOT 2.4*  PROT 7.6  ALBUMIN 2.5*   Recent Labs  Lab 07/02/22 0105  LIPASE 28   No results for input(s): "AMMONIA" in the last 168 hours.  ABG No results found for: "PHART", "PCO2ART", "PO2ART", "HCO3", "TCO2", "ACIDBASEDEF", "O2SAT"   Coagulation Profile: No results for input(s): "INR", "PROTIME" in the last 168 hours.  Cardiac Enzymes: No results for input(s): "CKTOTAL", "CKMB", "CKMBINDEX", "TROPONINI" in the last 168 hours.  HbA1C: No results found for: "HGBA1C"  CBG: No results for input(s): "GLUCAP" in the last 168 hours.  Review of Systems:   Please see the history of present illness. All other systems reviewed and are negative    Past Medical History:  She,  has a past medical history of CHF (congestive heart failure) (HCC), Chronic diastolic heart failure ([place]), Class 1 obesity with body mass index (BMI) of 32.0 to 32.9 in adult, Hypertension, Microcytic anemia, Mild pulmonary hypertension ([place]), Respiratory failure with hypoxia ([place]) (08/2017), and  SIRS (systemic inflammatory response syndrome) ([place]) (08/2017).   Surgical History:   Past Surgical History:  Procedure Laterality Date   APPENDECTOMY       Social History:   reports that she has never smoked. She has never used smokeless tobacco. She reports that she does not drink alcohol and does not use drugs.   Family History:  Her family history includes Diabetes Mellitus II in her mother; Heart disease in an other family member.   Allergies No Known Allergies   Home Medications  Prior to Admission medications   Medication Sig Start Date End Date Taking? Authorizing Provider  acetaminophen (TYLENOL) 325 MG tablet Take 2 tablets (650 mg total) by mouth every 6 (six) hours as needed for mild pain, moderate pain, fever or headache (or Fever >/= 101). 09/06/17   Hongalgi, Lenis Dickinson, MD  amLODipine (NORVASC) 5 MG tablet Take 1 tablet (5 mg total) by mouth daily. 07/02/19   Lelon Perla, MD  benzonatate (TESSALON) 200 MG capsule Take 1 capsule (200 mg total) by mouth 3 (three) times daily. Patient not taking: Reported on 04/27/2019 09/06/17   Hongalgi,  Lenis Dickinson, MD  furosemide (LASIX) 20 MG tablet Take 1 tablet (20 mg total) by mouth daily. 07/02/19   Lelon Perla, MD  guaiFENesin-dextromethorphan (ROBITUSSIN DM) 100-10 MG/5ML syrup Take 5 mLs by mouth every 6 (six) hours as needed for cough (chest congestion). Patient not taking: Reported on 04/27/2019 09/06/17   Modena Jansky, MD  methimazole (TAPAZOLE) 10 MG tablet Take 1 tablet (10 mg total) by mouth 2 (two) times daily. 04/28/19   Shamleffer, Melanie Crazier, MD  metoprolol tartrate (LOPRESSOR) 25 MG tablet Take 1 tablet (25 mg total) by mouth 2 (two) times daily. 07/02/19   Lelon Perla, MD     Critical care time: 35 minutes    CRITICAL CARE Performed by: Otilio Carpen Ernan Runkles   Total critical care time: 35 minutes  Critical care time was exclusive of separately billable procedures and treating other  patients.  Critical care was necessary to treat or prevent imminent or life-threatening deterioration.  Critical care was time spent personally by me on the following activities: development of treatment plan with patient and/or surrogate as well as nursing, discussions with consultants, evaluation of patient's response to treatment, examination of patient, obtaining history from patient or surrogate, ordering and performing treatments and interventions, ordering and review of laboratory studies, ordering and review of radiographic studies, pulse oximetry and re-evaluation of patient's condition.  Otilio Carpen Azyriah Nevins, PA-C [facility] See Amion for pager If no response to pager , please call [phone] until 7pm After 7:00 pm call Elink  [phone]?[place]

## 2022-07-02 NOTE — ED Notes (Signed)
Critical K of 2.6 communicated to Card[place] MD at this time

## 2022-07-02 NOTE — ED Provider Notes (Signed)
[facility] EMERGENCY DEPARTMENT Provider Note  CSN: [id number] Arrival date & time: 07/01/22 2235  Chief Complaint(s) SOB / Emesis   HPI Shannon Simpson is a 61 y.o. female with a past medical history listed below including hypertension, diastolic heart failure, hypothyroidism who presents to the emergency department with several weeks of gradually worsening and intermittent generalized fatigue with dyspnea on exertion and cough.  Patient also endorses intermittent palpitations.  No reported fevers.  Over the past couple days she has had several bouts of nonbloody nonbilious emesis.  She has intermittent epigastric abdominal discomfort.  No diarrhea.   No chest pain.  No other physical complaints.  The history is provided by the patient and a relative.    Past Medical History Past Medical History:  Diagnosis Date   CHF (congestive heart failure) (HCC)    Chronic diastolic heart failure (HCC)    Class 1 obesity with body mass index (BMI) of 32.0 to 32.9 in adult    Hypertension    Microcytic anemia    Mild pulmonary hypertension (HCC)    Respiratory failure with hypoxia (HCC) 08/2017   SIRS (systemic inflammatory response syndrome) (HCC) 08/2017   Patient Active Problem List   Diagnosis Date Noted   Hyperthyroidism 04/28/2019   Acute on chronic diastolic CHF (congestive heart failure) (HCC) 07/17/2018   Hypertensive urgency 07/17/2018   SIRS (systemic inflammatory response syndrome) (HCC) 09/04/2017   Acute respiratory failure with hypoxia (HCC) 09/04/2017   Hypertension 09/04/2017   Chronic diastolic heart failure (HCC) 09/04/2017   Mild pulmonary hypertension (HCC) 09/04/2017   Class 1 obesity due to excess calories with body mass index (BMI) of 32.0 to 32.9 in adult 09/04/2017   Microcytic anemia 09/04/2017   Obesity  10/06/2015   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 Authorizing Provider  acetaminophen (TYLENOL) 325 MG tablet Take 2 tablets (650 mg total) by mouth every 6 (six) hours as needed for mild pain, moderate pain, fever or headache (or Fever >/= 101). 09/06/17   Hongalgi, Maximino Greenland, MD  amLODipine (NORVASC) 5 MG tablet Take 1 tablet (5 mg total) by mouth daily. 07/02/19   Lewayne Bunting, MD  benzonatate (TESSALON) 200 MG capsule Take 1 capsule (200 mg total) by mouth 3 (three) times daily. Patient not taking: Reported on 04/27/2019 09/06/17   Elease Etienne, MD  furosemide (LASIX) 20 MG tablet Take 1 tablet (20 mg total) by mouth daily. 07/02/19   Lewayne Bunting, MD  guaiFENesin-dextromethorphan (ROBITUSSIN DM) 100-10 MG/5ML syrup Take 5 mLs by mouth every 6 (six) hours as needed for cough (chest congestion). Patient not taking: Reported on 04/27/2019 09/06/17   Elease Etienne, MD  methimazole (TAPAZOLE) 10 MG tablet Take 1 tablet (10 mg total) by mouth 2 (two) times daily. 04/28/19   Shamleffer, Konrad Dolores, MD  metoprolol tartrate (LOPRESSOR) 25 MG tablet Take 1 tablet (25 mg total) by mouth 2 (two) times daily. 07/02/19   Lewayne Bunting, MD  Allergies Patient has no known allergies.  Review of Systems Review of Systems As noted in HPI  Physical Exam Vital Signs  I have reviewed the triage vital signs BP 126/84   Pulse 93   Temp 99.1 F (37.3 C) (Oral)   Resp (!) 21   SpO2 93%   Physical Exam Vitals reviewed.  Constitutional:      General: She is not in acute distress.    Appearance: She is well-developed. She is not  diaphoretic.  HENT:     Head: Normocephalic and atraumatic.     Nose: Nose normal.  Eyes:     General: No scleral icterus.       Right eye: No discharge.        Left eye: No discharge.     Conjunctiva/sclera: Conjunctivae normal.     Pupils: Pupils are equal, round, and reactive to light.  Cardiovascular:     Rate and Rhythm: Tachycardia present. Rhythm irregularly irregular.     Heart sounds: No murmur heard.    No friction rub. No gallop.  Pulmonary:     Effort: Pulmonary effort is normal. No respiratory distress.     Breath sounds: No stridor. Examination of the left-lower field reveals rales. Rales present.  Abdominal:     General: There is no distension.     Palpations: Abdomen is soft.     Tenderness: There is abdominal tenderness in the right upper quadrant and epigastric area.  Musculoskeletal:        General: No tenderness.     Cervical back: Normal range of motion and neck supple.  Skin:    General: Skin is warm and dry.     Findings: No erythema or rash.  Neurological:     Mental Status: She is alert and oriented to person, place, and time.     ED Results and Treatments Labs (all labs ordered are listed, but only abnormal results are displayed) Labs Reviewed  RESP PANEL BY RT-PCR (RSV, FLU A&B, COVID)  RVPGX2 - Abnormal; Notable for the following components:      Result Value   SARS Coronavirus 2 by RT PCR POSITIVE (*)    Resp Syncytial Virus by PCR POSITIVE (*)    All other components within normal limits  COMPREHENSIVE METABOLIC PANEL - Abnormal; Notable for the following components:   Sodium 130 (*)    Potassium 2.6 (*)    Chloride 91 (*)    Glucose, Bld 246 (*)    Calcium 8.5 (*)    Albumin 2.5 (*)    Total Bilirubin 2.4 (*)    All other components within normal limits  CBC WITH DIFFERENTIAL/PLATELET - Abnormal; Notable for the following components:   WBC 27.6 (*)    RBC 6.06 (*)    HCT 35.1 (*)    MCV 57.9 (*)    MCH 20.6 (*)    RDW 16.3 (*)     Neutro Abs 19.0 (*)    Lymphs Abs 7.2 (*)    Monocytes Absolute 1.1 (*)    Basophils Absolute 0.3 (*)    All other components within normal limits  URINALYSIS, ROUTINE W REFLEX MICROSCOPIC - Abnormal; Notable for the following components:   Color, Urine AMBER (*)    Glucose, UA 50 (*)    Hgb urine dipstick SMALL (*)    Ketones, ur 5 (*)    Protein, ur 100 (*)    All other components within normal limits  TSH - Abnormal; Notable for  the following components:   TSH <0.010 (*)    All other components within normal limits  T4, FREE - Abnormal; Notable for the following components:   Free T4 >5.50 (*)    All other components within normal limits  BRAIN NATRIURETIC PEPTIDE - Abnormal; Notable for the following components:   B Natriuretic Peptide 439.6 (*)    All other components within normal limits  CULTURE, BLOOD (ROUTINE X 2)  CULTURE, BLOOD (ROUTINE X 2)  LIPASE, BLOOD  LACTIC ACID, PLASMA  MAGNESIUM  I-STAT BETA HCG BLOOD, ED ([facility], [facility], [facility] ONLY)                                                                                                                         EKG  EKG Interpretation  Date/Time:  Monday July 01 2022 22:57:21 EST Ventricular Rate:  162 PR Interval:    QRS Duration: 86 QT Interval:  264 QTC Calculation: 433 R Axis:   63 Text Interpretation: Atrial fibrillation with rapid ventricular response with premature ventricular or aberrantly conducted complexes Minimal voltage criteria for LVH, may be normal variant ( Sokolow-Lyon ) Marked ST abnormality, possible inferolateral subendocardial injury Abnormal ECG When compared with ECG of 17-Jul-2018 12:01, PREVIOUS ECG IS PRESENT Confirmed by Addison Lank [phone]) on 07/02/2022 12:31:33 AM       Radiology US Abdomen Limited RUQ (LIVER/GB)  Result Date: 07/02/2022 CLINICAL DATA:  Right upper quadrant pain EXAM: ULTRASOUND ABDOMEN LIMITED RIGHT UPPER QUADRANT COMPARISON:  No prior abdomen ultrasound available  FINDINGS: Gallbladder: Poor visualization of the gallbladder secondary to body habitus and rapid breathing. No definite gallstones or wall thickening visualized. No sonographic Murphy sign noted by sonographer. Common bile duct: Diameter: 1 mm, within normal limits. No intrahepatic biliary ductal dilatation. Liver: No focal lesion identified. Within normal limits in parenchymal echogenicity. Portal vein is patent on color Doppler imaging with normal direction of blood flow towards the liver. Other: None. IMPRESSION: Poor visualization of the gallbladder secondary to body habitus and rapid breathing. No definite sonographic abnormality. If there is persistent concern for gallbladder pathology, consider CT abdomen pelvis or HIDA scan. Electronically Signed   By: Merilyn Baba M.D.   On: 07/02/2022 02:24   DG Chest 2 View  Result Date: 07/01/2022 CLINICAL DATA:  Shortness of breath EXAM: CHEST - 2 VIEW COMPARISON:  Chest x-ray 07/17/2018 FINDINGS: The heart is enlarged, unchanged. There is mild central pulmonary vascular congestion. There is focal airspace opacity in the right lower lobe. There is no pleural effusion or pneumothorax. No acute fractures are seen. IMPRESSION: 1. Focal airspace opacity in the right lower lobe, concerning for pneumonia. Recommend follow-up x-ray in 4-6 weeks to confirm resolution. 2. Cardiomegaly with mild central pulmonary vascular congestion. Electronically Signed   By: Ronney Asters M.D.   On: 07/01/2022 23:29    Medications Ordered in ED Medications  amiodarone (NEXTERONE) 1.8 mg/mL load via infusion 150 mg (150 mg Intravenous Bolus from Bag 07/02/22 0115)  Followed by  amiodarone (NEXTERONE PREMIX) 360-4.14 MG/200ML-% (1.8 mg/mL) IV infusion (60 mg/hr Intravenous New Bag/Given 07/02/22 0435)    Followed by  amiodarone (NEXTERONE PREMIX) 360-4.14 MG/200ML-% (1.8 mg/mL) IV infusion (has no administration in time range)  potassium chloride 10 mEq in 100 mL IVPB (has no  administration in time range)  cefTRIAXone (ROCEPHIN) 1 g in sodium chloride 0.9 % 100 mL IVPB (0 g Intravenous Stopped 07/02/22 0145)  doxycycline (VIBRA-TABS) tablet 100 mg (100 mg Oral Given 07/02/22 0112)  propranolol (INDERAL) tablet 60 mg (60 mg Oral Given 07/02/22 0433)                                                                                                                                     Procedures .1-3 Lead EKG Interpretation  Performed by: Fatima Blank, MD Authorized by: Fatima Blank, MD     Interpretation: abnormal     ECG rate:  130-170   ECG rate assessment: tachycardic     Rhythm: atrial fibrillation     Ectopy: none     Conduction: normal   .Critical Care  Performed by: Fatima Blank, MD Authorized by: Fatima Blank, MD   Critical care provider statement:    Critical care time (minutes):  80   Critical care time was exclusive of:  Separately billable procedures and treating other patients   Critical care was necessary to treat or prevent imminent or life-threatening deterioration of the following conditions:  Endocrine crisis, cardiac failure and sepsis   Critical care was time spent personally by me on the following activities:  Development of treatment plan with patient or surrogate, discussions with consultants, evaluation of patient's response to treatment, examination of patient, obtaining history from patient or surrogate, review of old charts, re-evaluation of patient's condition, pulse oximetry, ordering and review of radiographic studies, ordering and review of laboratory studies and ordering and performing treatments and interventions   Care discussed with: admitting provider     (including critical care time)  Medical Decision Making / ED Course   Medical Decision Making Amount and/or Complexity of Data Reviewed Labs: ordered. Decision-making details documented in ED Course. Radiology: ordered and  independent interpretation performed. Decision-making details documented in ED Course. ECG/medicine tests: ordered and independent interpretation performed. Decision-making details documented in ED Course.  Risk Prescription drug management. Drug therapy requiring intensive monitoring for toxicity. Decision regarding hospitalization.    Patient presents with shortness of breath with emesis.  Noted to be tachycardic in A-fib RVR, new onset. Given her history of hyperthyroidism, will need to assess for possible thyroid storm. Will assess for infectious symptoms including pneumonia, respiratory viral process.  Given the abdominal pain and discomfort, will also assess for evidence of pancreatitis or biliary disease.  Will assess for any electrolyte or metabolic derangements.  EKG with A-fib RVR ranging in the 130s to 170s.  CBC with significant leukocytosis.  No anemia Metabolic panel with hypokalemia.  Hyperglycemia without DKA.  No renal insufficiency.  No evidence of biliary obstruction or pancreatitis. UA without evidence of infection Viral panel positive for COVID and RSV. Thyroid panel supportive of thyroid storm  Chest x-ray with evidence of right lower lobe pneumonia.  Also notable for cardiomegaly with pulmonary vascular congestion.  Heart failure labs obtained and BNP elevated greater than 400. RUQ [place] not consistent with acute cholecystitis.  Given the evidence of possible heart failure, patient was started on amiodarone for the A-fib RVR.  Code sepsis was initiated and patient was started on empiric antibiotics for community-acquired pneumonia. Patient also given propranolol p.o. for likely thyroid storm IV potassium repletion  Consulted critical care to evaluate for possible ICU admission.       Final Clinical Impression(s) / ED Diagnoses Final diagnoses:  Thyrotoxicosis with thyrotoxic crisis, unspecified thyrotoxicosis type  Atrial fibrillation with RVR ([place])   Community acquired pneumonia of right lower lobe of lung  COVID-19 virus infection  RSV infection  Hypokalemia           This chart was dictated using voice recognition software.  Despite best efforts to proofread,  errors can occur which can change the documentation meaning.    Fatima Blank, MD 07/02/22 [phone]

## 2022-07-02 NOTE — Progress Notes (Signed)
  Echocardiogram 2D Echocardiogram has been performed.  Shannon Simpson 07/02/2022, 3:46 PM

## 2022-07-02 NOTE — TOC Benefit Eligibility Note (Signed)
Patient Product/process development scientist completed.    The patient is currently admitted and upon discharge could be taking methimazole 10 mg.  The current 30 day co-pay is $4.00.   The patient is insured through [facility]   Roland Earl, CPHT Pharmacy Patient Advocate Specialist [facility] Patient Advocate Team Direct Number: [phone]  Fax: [phone]

## 2022-07-03 LAB — BASIC METABOLIC PANEL
Anion gap: 11 (ref 5–15)
BUN: 22 mg/dL — ABNORMAL HIGH (ref 6–20)
CO2: 25 mmol/L (ref 22–32)
Calcium: 8.7 mg/dL — ABNORMAL LOW (ref 8.9–10.3)
Chloride: 99 mmol/L (ref 98–111)
Creatinine, Ser: 0.63 mg/dL (ref 0.44–1.00)
GFR, Estimated: >60 mL/min (ref >60)
Glucose, Bld: 232 mg/dL — ABNORMAL HIGH (ref 70–99)
Potassium: 3.6 mmol/L (ref 3.5–5.1)
Sodium: 135 mmol/L (ref 135–145)

## 2022-07-03 LAB — GLUCOSE, CAPILLARY
Glucose-Capillary: 163 mg/dL — ABNORMAL HIGH (ref 70–99)
Glucose-Capillary: 180 mg/dL — ABNORMAL HIGH (ref 70–99)
Glucose-Capillary: 203 mg/dL — ABNORMAL HIGH (ref 70–99)
Glucose-Capillary: 205 mg/dL — ABNORMAL HIGH (ref 70–99)
Glucose-Capillary: 319 mg/dL — ABNORMAL HIGH (ref 70–99)

## 2022-07-03 LAB — CBC
HCT: 37.4 % (ref 36.0–46.0)
Hemoglobin: 12.6 g/dL (ref 12.0–15.0)
MCH: 20 pg — ABNORMAL LOW (ref 26.0–34.0)
MCHC: 33.7 g/dL (ref 30.0–36.0)
MCV: 59.4 fL — ABNORMAL LOW (ref 80.0–100.0)
Platelets: 254 10*3/uL (ref 150–400)
RBC: 6.3 MIL/uL — ABNORMAL HIGH (ref 3.87–5.11)
RDW: 17.5 % — ABNORMAL HIGH (ref 11.5–15.5)
WBC: 21.3 10*3/uL — ABNORMAL HIGH (ref 4.0–10.5)
nRBC: 0 % (ref 0.0–0.2)

## 2022-07-03 LAB — MAGNESIUM: Magnesium: 2.1 mg/dL (ref 1.7–2.4)

## 2022-07-03 MED ORDER — GUAIFENESIN-DM 100-10 MG/5ML PO SYRP
10.0000 mL | ORAL_SOLUTION | ORAL | Status: DC | PRN
  Administered 2022-07-03 – 2022-07-05 (×6): 10 mL via ORAL
  Filled 2022-07-03 (×6): qty 10

## 2022-07-03 MED ORDER — INSULIN ASPART 100 UNIT/ML IJ SOLN
0.0000 [IU] | INTRAMUSCULAR | Status: DC
  Administered 2022-07-03 (×2): 7 [IU] via SUBCUTANEOUS
  Administered 2022-07-04: 11 [IU] via SUBCUTANEOUS
  Administered 2022-07-04: 4 [IU] via SUBCUTANEOUS
  Administered 2022-07-04: 15 [IU] via SUBCUTANEOUS
  Administered 2022-07-04: 3 [IU] via SUBCUTANEOUS
  Administered 2022-07-04: 4 [IU] via SUBCUTANEOUS
  Administered 2022-07-05: 3 [IU] via SUBCUTANEOUS
  Administered 2022-07-05: 11 [IU] via SUBCUTANEOUS
  Administered 2022-07-05: 15 [IU] via SUBCUTANEOUS
  Administered 2022-07-05 (×2): 3 [IU] via SUBCUTANEOUS
  Administered 2022-07-05: 7 [IU] via SUBCUTANEOUS
  Administered 2022-07-06: 11 [IU] via SUBCUTANEOUS
  Administered 2022-07-06: 4 [IU] via SUBCUTANEOUS
  Administered 2022-07-06: 3 [IU] via SUBCUTANEOUS

## 2022-07-03 NOTE — Progress Notes (Signed)
eLink Physician-Brief Progress Note Patient Name: Shannon Simpson DOB: 06/04/1961 MRN: [id number]   Date of Service  07/03/2022  HPI/Events of Note  Cough  eICU Interventions  Plan: Robitussin DM 10 mL PO Q 4 hours PRN cough.      Intervention Category Major Interventions: Other:  Ziere Docken Dennard Nip 07/03/2022, 12:04 AM

## 2022-07-03 NOTE — Progress Notes (Signed)
Consultation Progress Note   Patient: Shannon Simpson GLO:[id number] DOB: [birth year] DOA: 07/01/2022 DOS: the patient was seen and examined on 07/03/2022 Primary service: Vanna Sailer, Elgie Collard, MD  Brief hospital course: 61 y.o. Chad F with PMH significant for HFpEF, HTN, pulm HTN, hyperthyroidism who presented to the ED with one week of nausea and vomiting and several days of cough with generalized weakness and dyspnea.   Pt speaks some English and daughter is at the bedside to help interpret, states no fever, leg edema, chest pain, or rigors at home, is not a smoker.  She stopped going to endocrinology some time ago because of cost, but says she has still been taking what Tapazole she has left.  She was in atrial fibrillation on arrival with stable BP and required [place] oxygen.  CXR showed mild vascular congestion and RLL airspace opacity.   Labs significant for Na 130, K 2.6, WBC 27k, TSH <0.010, T4 >5.5.  She was also both Covid-19 and RSV positive.    She was given propranolol, Doxycyline and Ceftriaxone and started on amiodarone gtt.  PCCM consulted for admission given concern for thyroid storm.   Assessment and Plan: Acute Hypoxic Respiratory Failure  Secondary to CAP vs aspiration, Covid-19 and RSV -supplement O2 to maintain sats >92% -steroids, CAP coverage with ceftriaxone and azithromycin Elevated CRP and sed rate, no lactic acidosis -IS -blood cultures pending, MRSA negative    Hyperthyroidism Concern for thyroid storm Pt has been admitted with similar labs and non-compliance with medications, suspect this is somewhat chronic as she has not seen endocrinology for some time and has been making her remaining Tapazole last -she is in no distress, no high fever, agitation, jaundice or AMS, doubt severe thyroid stom -admitted to ICU for close monitoring, Transferred to [facility] 12/20  -start hydrocortisone 100mg  q6hr, received propranolol -resume Tapazole   Atrial Fibrillation History  HFpEF HTN -continue amiodarone gtt -transition to home metoprolol when able -consider anti-coagulation if does not convert to sinus  -suspect she is also volume depleted, no fluids given thus far, 1 L LR now -echo pending   Hyponatremia Hypokalemia  -given in the ED, give po K and repeat BMP -Magnesium level pending -monitor and replete as needed -Na 130, IVF and repeat     Nausea/vomiting Without significant abdominal pain or diarrhea -able to tolerate po's in the ED -RUQ unremarkable and lipase WNL -zofran prn -clear liquids, advance as tolerated     Hyperglycemia -SSI      TRH will continue to follow the patient.  Subjective: No complaints this morning  Physical Exam: Vitals:   07/03/22 0900 07/03/22 1000 07/03/22 1200 07/03/22 1208  BP: (!) 103/49 (!) 115/57 125/66 125/66  Pulse: (!) 59 84  78  Resp: (!) 23 (!) 23 (!) 22   Temp:   98 F (36.7 C)   TempSrc:   Oral   SpO2: 94% 98% 94%   Weight:      General:  Acutely ill appearing HEENT: MM pink/moist, pupils equal, sclera anicteric CV: Irregular rhythm, rate controlled PULM:  mild tachypnea and decreased air entry bilaterally without rhonchi, wheezing or accessory muscle use GI: soft, Normoactive bowel sounds Extremities: warm/dry, no edema  Skin: no rashes or lesions Neuro: awake, alert, moving all extremities and oriented  Data Reviewed:  There are no new results to review at this time.  Family Communication:   Time spent: 15 minutes.  Author: 07/05/22, MD 07/03/2022 12:36 PM  For on  call review [email or web address].

## 2022-07-03 NOTE — Inpatient Diabetes Management (Signed)
Inpatient Diabetes Program Recommendations  AACE/ADA: New Consensus Statement on Inpatient Glycemic Control (2015)  Target Ranges:  Prepandial:   less than 140 mg/dL      Peak postprandial:   less than 180 mg/dL (1-2 hours)      Critically ill patients:  140 - 180 mg/dL   Lab Results  Component Value Date   GLUCAP 319 (H) 07/03/2022    Latest Reference Range & Units 07/02/22 07:54 07/02/22 11:37 07/02/22 16:06 07/02/22 20:02 07/02/22 21:20 07/03/22 08:03  Glucose-Capillary 70 - 99 mg/dL 008 (H) 676 (H) 195 (H) 261 (H) 274 (H) 319 (H)  (H): Data is abnormally high  Diabetes history: No hx Current orders for Inpatient glycemic control: Novolog 0-20 units tid, 0-5 units hs  Solucortef 100 mg q 8 hrs.  Inpatient Diabetes Program Recommendations:   CBGs elevated post each dose of steroids. Please consider while on steroids: -Change Novolog correction to q 4 hrs.  Thank you, Shannon Simpson. Shannon Vanasten, RN, MSN, CDE  Diabetes Coordinator Inpatient Glycemic Control Team Team Pager [phone] (8am-5pm) 07/03/2022 11:57 AM

## 2022-07-03 NOTE — Progress Notes (Signed)
Pt admitted to 3w24 at this time.  A & O x 4. Denies pain.  Daughter at bedside. Bed alarm set.  Pt verbalizes understanding to call before attempting to get OOB.

## 2022-07-04 DIAGNOSIS — R0603 Acute respiratory distress: Secondary | ICD-10-CM | POA: Diagnosis not present

## 2022-07-04 LAB — GLUCOSE, CAPILLARY
Glucose-Capillary: 291 mg/dL — ABNORMAL HIGH (ref 70–99)
Glucose-Capillary: 151 mg/dL — ABNORMAL HIGH (ref 70–99)
Glucose-Capillary: 104 mg/dL — ABNORMAL HIGH (ref 70–99)
Glucose-Capillary: 313 mg/dL — ABNORMAL HIGH (ref 70–99)
Glucose-Capillary: 198 mg/dL — ABNORMAL HIGH (ref 70–99)

## 2022-07-04 LAB — COMPREHENSIVE METABOLIC PANEL
ALT: 19 U/L (ref 0–44)
AST: 25 U/L (ref 15–41)
Albumin: 2.2 g/dL — ABNORMAL LOW (ref 3.5–5.0)
Alkaline Phosphatase: 71 U/L (ref 38–126)
Anion gap: 10 (ref 5–15)
BUN: 30 mg/dL — ABNORMAL HIGH (ref 6–20)
CO2: 26 mmol/L (ref 22–32)
Calcium: 8.6 mg/dL — ABNORMAL LOW (ref 8.9–10.3)
Chloride: 102 mmol/L (ref 98–111)
Creatinine, Ser: 0.77 mg/dL (ref 0.44–1.00)
GFR, Estimated: >60 mL/min (ref >60)
Glucose, Bld: 286 mg/dL — ABNORMAL HIGH (ref 70–99)
Potassium: 3.7 mmol/L (ref 3.5–5.1)
Sodium: 138 mmol/L (ref 135–145)
Total Bilirubin: 0.6 mg/dL (ref 0.3–1.2)
Total Protein: 6.7 g/dL (ref 6.5–8.1)

## 2022-07-04 LAB — CBC
HCT: 36.8 % (ref 36.0–46.0)
Hemoglobin: 12.4 g/dL (ref 12.0–15.0)
MCH: 19.9 pg — ABNORMAL LOW (ref 26.0–34.0)
MCHC: 33.7 g/dL (ref 30.0–36.0)
MCV: 59.1 fL — ABNORMAL LOW (ref 80.0–100.0)
Platelets: 277 10*3/uL (ref 150–400)
RBC: 6.23 MIL/uL — ABNORMAL HIGH (ref 3.87–5.11)
RDW: 17.2 % — ABNORMAL HIGH (ref 11.5–15.5)
WBC: 17.9 10*3/uL — ABNORMAL HIGH (ref 4.0–10.5)
nRBC: 0.2 % (ref 0.0–0.2)

## 2022-07-04 LAB — HEMOGLOBIN A1C
Hgb A1c MFr Bld: 8.3 % — ABNORMAL HIGH (ref 4.8–5.6)
Mean Plasma Glucose: 192 mg/dL

## 2022-07-04 MED ORDER — DEXAMETHASONE 4 MG PO TABS
6.0000 mg | ORAL_TABLET | Freq: Every day | ORAL | Status: DC
  Administered 2022-07-04 – 2022-07-06 (×3): 6 mg via ORAL
  Filled 2022-07-04 (×3): qty 1

## 2022-07-04 NOTE — Progress Notes (Signed)
Consultation Progress Note   Patient: Shannon Simpson ZYS:[id number] DOB: 1961-03-08 DOA: 07/01/2022 DOS: the patient was seen and examined on 07/04/2022 Primary service: Barnetta Chapel, MD  Brief hospital course: 61 y.o. [place] F with PMH significant for HFpEF, HTN, pulm HTN, hyperthyroidism who presented to the ED with one week of nausea and vomiting and several days of cough with generalized weakness and dyspnea.   Pt speaks some English and daughter is at the bedside to help interpret, states no fever, leg edema, chest pain, or rigors at home, is not a smoker.  She stopped going to endocrinology some time ago because of cost, but says she has still been taking what Tapazole she has left.  She was in atrial fibrillation on arrival with stable BP and required [place] oxygen.  CXR showed mild vascular congestion and RLL airspace opacity.   Labs significant for Na 130, K 2.6, WBC 27k, TSH <0.010, T4 >5.5.  She was also both Covid-19 and RSV positive.    She was given propranolol, Doxycyline and Ceftriaxone and started on amiodarone gtt.  PCCM consulted for admission given concern for thyroid storm.   07/04/2022: Patient seen alongside patient's daughter.  Patient's daughter assisted with translation.  No new complaints today.  Shortness of breath is improving.  No fever or chills.  Assessment and Plan: Acute Hypoxic Respiratory Failure  Secondary to CAP vs aspiration, Covid-19 and RSV -Patient remains on supplemental oxygen.   -steroids, CAP coverage with ceftriaxone and azithromycin -Repeat CRP in the morning.   -Low threshold to change IV Solu-Cortef to oral steroids (dexamethasone 6 Mg p.o. once daily for 10 days).   -blood cultures pending, MRSA negative    Hyperthyroidism -Initial concern for thyroid storm -Pt has been admitted with similar labs and non-compliance with medications, suspect this is somewhat chronic as she has not seen endocrinology for some time and has been making her  remaining Tapazole last -she is in no distress, no high fever, agitation, jaundice or AMS, doubt severe thyroid stom -admitted to ICU for close monitoring, Transferred to [facility] 12/20  -start hydrocortisone 100mg  q6hr, received propranolol -resume Tapazole 07/04/2022: Will change IV Solu-Cortef to dexamethasone.  Patient is on methimazole 10 Mg p.o. twice daily.  Low threshold to repeat TSH and free T4.    Atrial Fibrillation History HFpEF HTN -Heart rate is controlled. -Patient is of amiodarone. -Continue beta-blocker.   -transition to home metoprolol when able -consider anti-coagulation if does not convert to sinus  -Echo revealed: "1. Left ventricular ejection fraction, by estimation, is 55 to 60%. The  left ventricle has normal function. The left ventricle has no regional  wall motion abnormalities. There is mild concentric left ventricular  hypertrophy. Left ventricular diastolic  function could not be evaluated.   2. Right ventricular systolic function is normal. The right ventricular  size is normal. There is moderately elevated pulmonary artery systolic  pressure. The estimated right ventricular systolic pressure is 46.8 mmHg.   3. Left atrial size was severely dilated.   4. The mitral valve is grossly normal. Moderate mitral valve  regurgitation. No evidence of mitral stenosis.   5. The aortic valve is tricuspid. There is mild calcification of the  aortic valve. There is mild thickening of the aortic valve. Aortic valve  regurgitation is mild. Aortic valve sclerosis is present, with no evidence  of aortic valve stenosis.   6. There is mild dilatation of the ascending aorta, measuring 40 mm.   7. The inferior vena  cava is dilated in size with <50% respiratory  variability, suggesting right atrial pressure of 15 mmHg".    Hyponatremia Hypokalemia  -Resolved.    Nausea/vomiting -Resolved. -RUQ US unremarkable and lipase WNL -zofran prn -clear liquids, advance as  tolerated     Hyperglycemia -SSI      TRH will continue to follow the patient.  Subjective: No complaints this morning  Physical Exam: Vitals:   07/03/22 2352 07/04/22 0000 07/04/22 0300 07/04/22 0304  BP: 101/73  (!) 133/92 124/77  Pulse: 91  (!) 52 85  Resp: (!) 27 19 (!) 23 (!) 21  Temp: 97.7 F (36.5 C)  97.7 F (36.5 C) 97.7 F (36.5 C)  TempSrc: Oral  Oral Oral  SpO2: 98%  98% 99%  Weight:      Height:      General: Awake and alert.  Not in any distress. Neck: Supple. HEENT: Mild pallor.  No jaundice. CVS: S1-S2.   PULM: Decreased air entry globally.   GI: soft, Normoactive bowel sounds Extremities: No leg edema.   Neuro: awake, alert, moving all extremities and oriented  Data Reviewed:  There are no new results to review at this time.  Family Communication:   Time spent: 15 minutes.  Author: Bonnell Public, MD 07/04/2022 10:50 AM  For on call review [email or web address].

## 2022-07-05 LAB — MAGNESIUM: Magnesium: 1.9 mg/dL (ref 1.7–2.4)

## 2022-07-05 LAB — RENAL FUNCTION PANEL
Albumin: 2.4 g/dL — ABNORMAL LOW (ref 3.5–5.0)
Anion gap: 9 (ref 5–15)
BUN: 27 mg/dL — ABNORMAL HIGH (ref 6–20)
CO2: 26 mmol/L (ref 22–32)
Calcium: 8.9 mg/dL (ref 8.9–10.3)
Chloride: 107 mmol/L (ref 98–111)
Creatinine, Ser: 0.92 mg/dL (ref 0.44–1.00)
GFR, Estimated: >60 mL/min (ref >60)
Glucose, Bld: 268 mg/dL — ABNORMAL HIGH (ref 70–99)
Phosphorus: 3.7 mg/dL (ref 2.5–4.6)
Potassium: 3.8 mmol/L (ref 3.5–5.1)
Sodium: 142 mmol/L (ref 135–145)

## 2022-07-05 LAB — GLUCOSE, CAPILLARY
Glucose-Capillary: 128 mg/dL — ABNORMAL HIGH (ref 70–99)
Glucose-Capillary: 143 mg/dL — ABNORMAL HIGH (ref 70–99)
Glucose-Capillary: 143 mg/dL — ABNORMAL HIGH (ref 70–99)
Glucose-Capillary: 204 mg/dL — ABNORMAL HIGH (ref 70–99)
Glucose-Capillary: 273 mg/dL — ABNORMAL HIGH (ref 70–99)
Glucose-Capillary: 325 mg/dL — ABNORMAL HIGH (ref 70–99)

## 2022-07-05 LAB — C-REACTIVE PROTEIN: CRP: 3.1 mg/dL — ABNORMAL HIGH (ref <1.0)

## 2022-07-05 NOTE — Progress Notes (Addendum)
Consultation Progress Note   Patient: Shannon Simpson [id number] DOB: 1960/10/31 DOA: 07/01/2022 DOS: the patient was seen and examined on 07/05/2022 Primary service: Bonnell Public, MD  Brief hospital course: 61 y.o. [place] F with PMH significant for HFpEF, HTN, pulm HTN, hyperthyroidism who presented to the ED with one week of nausea and vomiting and several days of cough with generalized weakness and dyspnea.   Pt speaks some English and daughter is at the bedside to help interpret, states no fever, leg edema, chest pain, or rigors at home, is not a smoker.  She stopped going to endocrinology some time ago because of cost, but says she has still been taking what Tapazole she has left.  She was in atrial fibrillation on arrival with stable BP and required [place] oxygen.  CXR showed mild vascular congestion and RLL airspace opacity.   Labs significant for Na 130, K 2.6, WBC 27k, TSH <0.010, T4 >5.5.  She was also both Covid-19 and RSV positive.    She was given propranolol, Doxycyline and Ceftriaxone and started on amiodarone gtt.  PCCM consulted for admission given concern for thyroid storm.   07/04/2022: Patient seen alongside patient's daughter.  Patient's daughter assisted with translation.  No new complaints today.  Shortness of breath is improving.  No fever or chills. 07/05/2022: Seen alongside patient's son and nurse.  Patient continues to improve.  No new complaints today.  Assessment and Plan: Acute Hypoxic Respiratory Failure  Secondary to CAP vs aspiration, Covid-19 and RSV -Patient remains on supplemental oxygen.   -steroids, CAP coverage with ceftriaxone and azithromycin -Repeat CRP in the morning.   -Low threshold to change IV Solu-Cortef to oral steroids (dexamethasone 6 Mg p.o. once daily for 10 days).   -blood cultures pending, MRSA negative 07/05/2022: Respiratory failure has improved significantly.  Repeat CBC in the morning.  Complete course of antibiotics.  Likely  DC tomorrow.    Hyperthyroidism -Initial concern for thyroid storm -Pt has been admitted with similar labs and non-compliance with medications, suspect this is somewhat chronic as she has not seen endocrinology for some time and has been making her remaining Tapazole last -she is in no distress, no high fever, agitation, jaundice or AMS, doubt severe thyroid stom -admitted to ICU for close monitoring, Transferred to [facility] 12/20  -start hydrocortisone 100mg  q6hr, received propranolol -resume Tapazole 07/04/2022: Will change IV Solu-Cortef to dexamethasone.  Patient is on methimazole 10 Mg p.o. twice daily.  Low threshold to repeat TSH and free T4.  07/05/2022: Complete course of oral dexamethasone.   Atrial Fibrillation History HFpEF HTN -Heart rate is controlled. -Patient is of amiodarone. -Continue beta-blocker.   -transition to home metoprolol when able -consider anti-coagulation if does not convert to sinus  -Echo revealed: "1. Left ventricular ejection fraction, by estimation, is 55 to 60%. The  left ventricle has normal function. The left ventricle has no regional  wall motion abnormalities. There is mild concentric left ventricular  hypertrophy. Left ventricular diastolic  function could not be evaluated.   2. Right ventricular systolic function is normal. The right ventricular  size is normal. There is moderately elevated pulmonary artery systolic  pressure. The estimated right ventricular systolic pressure is [id number] mmHg.   3. Left atrial size was severely dilated.   4. The mitral valve is grossly normal. Moderate mitral valve  regurgitation. No evidence of mitral stenosis.   5. The aortic valve is tricuspid. There is mild calcification of the  aortic valve. There is mild  thickening of the aortic valve. Aortic valve  regurgitation is mild. Aortic valve sclerosis is present, with no evidence  of aortic valve stenosis.   6. There is mild dilatation of the ascending aorta,  measuring 40 mm.   7. The inferior vena cava is dilated in size with <50% respiratory  variability, suggesting right atrial pressure of 15 mmHg".    Hyponatremia Hypokalemia  -Resolved.    Nausea/vomiting -Resolved. -RUQ US unremarkable and lipase WNL -zofran prn -clear liquids, advance as tolerated     Hyperglycemia -SSI  Subjective:  -Shortness of breath is improving.    Physical Exam: Vitals:   07/05/22 0850 07/05/22 1114 07/05/22 1200 07/05/22 1549  BP: (!) 157/85 (!) 151/88  (!) 145/98  Pulse: 91 98 88 (!) 57  Resp: (!) 26 (!) 34 20 (!) 21  Temp: 98 F (36.7 C) 98 F (36.7 C)  98.3 F (36.8 C)  TempSrc: Oral Oral  Oral  SpO2: 97% 94% 94% 97%  Weight:      Height:      General: Awake and alert.  Not in any distress. Neck: Supple. HEENT: Mild pallor.  No jaundice. CVS: S1-S2.   PULM: Decreased air entry globally.   GI: soft, Normoactive bowel sounds Extremities: No leg edema.   Neuro: awake, alert, moving all extremities and oriented  Data Reviewed:  There are no new results to review at this time.  Family Communication:   Time spent: 35 minutes.  Author: Bonnell Public, MD 07/05/2022 5:53 PM  For on call review [email or web address].

## 2022-07-05 NOTE — TOC Initial Note (Signed)
Transition of Care [facility]) - Initial/Assessment Note    Patient Details  Name: Shannon Simpson MRN: [id number] Date of Birth: 1961-05-15  [facility]) CM/SW Contact:    Lockie Pares, RN Phone Number: 07/05/2022, 3:14 PM  Clinical Narrative:                  Transitions of Care ( TOC) has reviewed patient and found no needs. The patient will be discussed on daily progressive rounds. If a need is identified please place a TOC consult       Patient Goals and CMS Choice            Expected Discharge Plan and Services                                              Prior Living Arrangements/Services                       Activities of Daily Living Home Assistive Devices/Equipment: None ADL Screening (condition at time of admission) Patient's cognitive ability adequate to safely complete daily activities?: Yes Is the patient deaf or have difficulty hearing?: No Does the patient have difficulty seeing, even when wearing glasses/contacts?: No Does the patient have difficulty concentrating, remembering, or making decisions?: No Patient able to express need for assistance with ADLs?: Yes Does the patient have difficulty dressing or bathing?: No Independently performs ADLs?: Yes (appropriate for developmental age) Does the patient have difficulty walking or climbing stairs?: No Weakness of Legs: None Weakness of Arms/Hands: None  Permission Sought/Granted                  Emotional Assessment              Admission diagnosis:  Hypokalemia [E87.6] Respiratory distress [R06.03] Atrial fibrillation with RVR (HCC) [I48.91] RSV infection [B33.8] Thyrotoxicosis with thyrotoxic crisis, unspecified thyrotoxicosis type [E05.91] Community acquired pneumonia of right lower lobe of lung [J18.9] COVID-19 virus infection [U07.1] Patient Active Problem List   Diagnosis Date Noted   Respiratory distress 07/02/2022   Thyrotoxicosis  with thyrotoxic crisis 07/02/2022   Hyperthyroidism 04/28/2019   Acute on chronic diastolic CHF (congestive heart failure) (HCC) 07/17/2018   Hypertensive urgency 07/17/2018   SIRS (systemic inflammatory response syndrome) (HCC) 09/04/2017   Acute respiratory failure with hypoxia (HCC) 09/04/2017   Hypertension 09/04/2017   Chronic diastolic heart failure (HCC) 09/04/2017   Mild pulmonary hypertension (HCC) 09/04/2017   Class 1 obesity due to excess calories with body mass index (BMI) of 32.0 to 32.9 in adult 09/04/2017   Microcytic anemia 09/04/2017   Obesity  10/06/2015   Hypertensive cardiovascular disease 10/06/2015   CHF (congestive heart failure) (HCC) 10/03/2015   Essential hypertension 10/03/2015   Microcytosis 10/03/2015   Hypertensive cardiomyopathy (HCC) 07/11/2015   Acute CHF (congestive heart failure) (HCC) 07/11/2015   Dyspnea on exertion 07/10/2015   PCP:  Patient, No Pcp Per Pharmacy:   [facility] [address] [place] [address] [place] [address] Phone: [phone] Fax: [phone]     Social Determinants of Health (SDOH) Social History: SDOH Screenings   Food Insecurity: No Food Insecurity (07/03/2022)  Housing: Low Risk  (07/03/2022)  Transportation Needs: No Transportation Needs (07/03/2022)  Utilities: Not At Risk (07/03/2022)  Tobacco Use: Low Risk  (07/02/2022)  SDOH Interventions:     Readmission Risk Interventions     No data to display

## 2022-07-06 DIAGNOSIS — R0603 Acute respiratory distress: Secondary | ICD-10-CM | POA: Diagnosis not present

## 2022-07-06 LAB — CBC WITH DIFFERENTIAL/PLATELET
Abs Immature Granulocytes: 0 10*3/uL (ref 0.00–0.07)
Basophils Absolute: 0 10*3/uL (ref 0.0–0.1)
Basophils Relative: 0 %
Eosinophils Absolute: 0 10*3/uL (ref 0.0–0.5)
Eosinophils Relative: 0 %
HCT: 35.3 % — ABNORMAL LOW (ref 36.0–46.0)
Hemoglobin: 12.3 g/dL (ref 12.0–15.0)
Lymphocytes Relative: 13 %
Lymphs Abs: 1.6 10*3/uL (ref 0.7–4.0)
MCH: 20.6 pg — ABNORMAL LOW (ref 26.0–34.0)
MCHC: 34.8 g/dL (ref 30.0–36.0)
MCV: 59 fL — ABNORMAL LOW (ref 80.0–100.0)
Monocytes Absolute: 0.4 10*3/uL (ref 0.1–1.0)
Monocytes Relative: 3 %
Neutro Abs: 10.2 10*3/uL — ABNORMAL HIGH (ref 1.7–7.7)
Neutrophils Relative %: 84 %
Platelets: 324 10*3/uL (ref 150–400)
RBC: 5.98 MIL/uL — ABNORMAL HIGH (ref 3.87–5.11)
RDW: 16.1 % — ABNORMAL HIGH (ref 11.5–15.5)
WBC: 12.1 10*3/uL — ABNORMAL HIGH (ref 4.0–10.5)
nRBC: 0.2 % (ref 0.0–0.2)
nRBC: 1 /100 WBC — ABNORMAL HIGH

## 2022-07-06 LAB — GLUCOSE, CAPILLARY
Glucose-Capillary: 143 mg/dL — ABNORMAL HIGH (ref 70–99)
Glucose-Capillary: 188 mg/dL — ABNORMAL HIGH (ref 70–99)
Glucose-Capillary: 290 mg/dL — ABNORMAL HIGH (ref 70–99)

## 2022-07-06 MED ORDER — AMOXICILLIN-POT CLAVULANATE 875-125 MG PO TABS: 1.0000 | ORAL_TABLET | Freq: Two times a day (BID) | ORAL | 0 refills | Status: AC

## 2022-07-06 MED ORDER — DEXAMETHASONE 6 MG PO TABS: 6.0000 mg | ORAL_TABLET | Freq: Every day | ORAL | 0 refills | Status: AC

## 2022-07-06 NOTE — Discharge Summary (Signed)
Physician Discharge Summary  Patient ID: Shannon Simpson MRN: [id number] DOB/AGE: 01/29/1961 61 y.o.  Admit date: 07/01/2022 Discharge date: 07/06/2022  Admission Diagnoses:  Discharge Diagnoses:  Principal Problem:   Respiratory distress/acute hypoxic respiratory failure Active Problems:   Thyrotoxicosis with thyrotoxic crisis Community acquired pneumonia/aspiration pneumonia COVID-19 RSV infection Atrial fibrillation Hypertension History of heart failure with preserved ejection fraction Hyponatremia Hypokalemia Nausea and vomiting Hyperglycemia   Discharged Condition: stable  Hospital Course: Patient is a 61 year old [place] female with past medical history significant for HFpEF, HTN, pulm HTN, hyperthyroidism who presented to the ED with one week of nausea and vomiting and several days of cough with generalized weakness and dyspnea.  Patient stopped following up with the endocrinology team due to financial challenges.  On presentation, patient was noted to be in atrial fibrillation, with stable BP.  Patient was requiring supplemental oxygen via nasal cannula.  CXR showed mild vascular congestion and RLL airspace opacity.   Labs were significant for Na 130, K 2.6, WBC 27k, TSH <0.010, T4 >5.5.  She was also both Covid-19 and RSV positive.    She was given propranolol, Doxycyline and Ceftriaxone and started on amiodarone gtt.  PCCM consulted for admission given concern for thyroid storm.  Patient was managed for hyperthyroidism, COVID-19 infection, RSV virus infection, community-acquired pneumonia versus aspiration pneumonia.  Patient is back to normal sinus rhythm.  Respiratory failure has resolved.  Patient is off oxygen.  Patient will be discharged home on Augmentin.  Patient will complete course of oral dexamethasone.  Patient will follow-up with the primary care provider within 1 week of discharge.   Acute Hypoxic Respiratory Failure  Secondary to CAP vs aspiration, Covid-19  and RSV -Respiratory failure has resolved. -Complete course of antibiotics.      Hyperthyroidism -Initial concern for thyroid storm -Pt has been admitted with similar labs and non-compliance with medications, suspect this is somewhat chronic as she has not seen endocrinology for some time and was taking her remaining Tapazole last -Patient was initially managed with intravenous hydrocortisone 100mg  q6hr and, propranolol. -Tapazole has been resumed. -Inflatable leg pain as per hyperbaric care provider. .   Atrial Fibrillation History HFpEF HTN -Heart rate is controlled. -Patient is off amiodarone. -Continue beta-blocker.    -Echo revealed: "1. Left ventricular ejection fraction, by estimation, is 55 to 60%. The  left ventricle has normal function. The left ventricle has no regional  wall motion abnormalities. There is mild concentric left ventricular  hypertrophy. Left ventricular diastolic  function could not be evaluated.   2. Right ventricular systolic function is normal. The right ventricular  size is normal. There is moderately elevated pulmonary artery systolic  pressure. The estimated right ventricular systolic pressure is 46.8 mmHg.   3. Left atrial size was severely dilated.   4. The mitral valve is grossly normal. Moderate mitral valve  regurgitation. No evidence of mitral stenosis.   5. The aortic valve is tricuspid. There is mild calcification of the  aortic valve. There is mild thickening of the aortic valve. Aortic valve  regurgitation is mild. Aortic valve sclerosis is present, with no evidence  of aortic valve stenosis.   6. There is mild dilatation of the ascending aorta, measuring 40 mm.   7. The inferior vena cava is dilated in size with <50% respiratory  variability, suggesting right atrial pressure of 15 mmHg".    Hyponatremia Hypokalemia  -Resolved.    Nausea/vomiting -Resolved. -RUQ unremarkable and lipase WNL -zofran prn -clear liquids, advance  as tolerated     Hyperglycemia -SSI    Consults: None  Discharge Exam: Blood pressure (!) 162/86, pulse 96, temperature 98 F (36.7 C), resp. rate (!) 23, height 4\' 9"  (1.448 m), weight 63.6 kg, SpO2 97 %.   Disposition: Discharge disposition: 01-Home or Self Care       Discharge Instructions     Diet - low sodium heart healthy   Complete by: As directed    Increase activity slowly   Complete by: As directed       Allergies as of 07/06/2022   No Known Allergies      Medication List     STOP taking these medications    acetaminophen 325 MG tablet Commonly known as: TYLENOL   furosemide 20 MG tablet Commonly known as: LASIX   guaiFENesin-dextromethorphan 100-10 MG/5ML syrup Commonly known as: ROBITUSSIN DM   NyQuil HBP Cold & Flu 15-6.25-325 MG/15ML Liqd Generic drug: DM-Doxylamine-Acetaminophen       TAKE these medications    amLODipine 5 MG tablet Commonly known as: NORVASC Take 1 tablet (5 mg total) by mouth daily.   amoxicillin-clavulanate 875-125 MG tablet Commonly known as: AUGMENTIN Take 1 tablet by mouth 2 (two) times daily for 5 days.   dexamethasone 6 MG tablet Commonly known as: DECADRON Take 1 tablet (6 mg total) by mouth daily for 5 days. Start taking on: July 07, 2022   methimazole 10 MG tablet Commonly known as: TAPAZOLE Take 1 tablet (10 mg total) by mouth 2 (two) times daily.   metoprolol tartrate 25 MG tablet Commonly known as: LOPRESSOR Take 1 tablet (25 mg total) by mouth 2 (two) times daily.         SignedDecember 26, 2023 07/06/2022, 10:46 AM

## 2022-07-06 NOTE — Progress Notes (Signed)
Patient informed to follow up with PCP in one week. Patient informed to call number on back of medicaid card to receive instructions on establishing care with a provider in the area. Daughter at bedside who verbalizes understanding and states she will call number on medicaid card. Pt discharged via WC in stable condition.

## 2022-07-07 LAB — CULTURE, BLOOD (ROUTINE X 2)
Culture: NO GROWTH
Culture: NO GROWTH
Special Requests: ADEQUATE
Special Requests: ADEQUATE

## 2022-09-30 ENCOUNTER — Ambulatory Visit (HOSPITAL_COMMUNITY)
Admission: EM | Admit: 2022-09-30 | Discharge: 2022-09-30 | Disposition: A | Discharge status: Home / Self Care | Payer: Medicaid Other | Attending: Family Medicine | Admitting: Family Medicine

## 2022-09-30 ENCOUNTER — Other Ambulatory Visit: Payer: Self-pay

## 2022-09-30 ENCOUNTER — Emergency Department (HOSPITAL_COMMUNITY)
Admission: EM | Admit: 2022-09-30 | Discharge: 2022-10-01 | Disposition: A | Discharge status: Home / Self Care | Payer: Medicaid Other | Attending: Emergency Medicine | Admitting: Emergency Medicine

## 2022-09-30 ENCOUNTER — Encounter (HOSPITAL_COMMUNITY): Payer: Self-pay

## 2022-09-30 ENCOUNTER — Emergency Department (HOSPITAL_COMMUNITY): Payer: Medicaid Other

## 2022-09-30 DIAGNOSIS — I10 Essential (primary) hypertension: Secondary | ICD-10-CM | POA: Diagnosis not present

## 2022-09-30 DIAGNOSIS — Z20822 Contact with and (suspected) exposure to covid-19: Secondary | ICD-10-CM | POA: Insufficient documentation

## 2022-09-30 DIAGNOSIS — I4891 Unspecified atrial fibrillation: Secondary | ICD-10-CM

## 2022-09-30 DIAGNOSIS — R079 Chest pain, unspecified: Secondary | ICD-10-CM | POA: Diagnosis present

## 2022-09-30 DIAGNOSIS — R0602 Shortness of breath: Secondary | ICD-10-CM | POA: Diagnosis not present

## 2022-09-30 DIAGNOSIS — E876 Hypokalemia: Secondary | ICD-10-CM | POA: Diagnosis not present

## 2022-09-30 DIAGNOSIS — Z79899 Other long term (current) drug therapy: Secondary | ICD-10-CM | POA: Insufficient documentation

## 2022-09-30 DIAGNOSIS — E0590 Thyrotoxicosis, unspecified without thyrotoxic crisis or storm: Secondary | ICD-10-CM

## 2022-09-30 LAB — COMPREHENSIVE METABOLIC PANEL
ALT: 20 U/L (ref 0–44)
AST: 35 U/L (ref 15–41)
Albumin: 2.8 g/dL — ABNORMAL LOW (ref 3.5–5.0)
Alkaline Phosphatase: 72 U/L (ref 38–126)
Anion gap: 16 — ABNORMAL HIGH (ref 5–15)
BUN: 14 mg/dL (ref 6–20)
CO2: 21 mmol/L — ABNORMAL LOW (ref 22–32)
Calcium: 9.2 mg/dL (ref 8.9–10.3)
Chloride: 99 mmol/L (ref 98–111)
Creatinine, Ser: 0.66 mg/dL (ref 0.44–1.00)
GFR, Estimated: >60 mL/min (ref >60)
Glucose, Bld: 234 mg/dL — ABNORMAL HIGH (ref 70–99)
Potassium: 3 mmol/L — ABNORMAL LOW (ref 3.5–5.1)
Sodium: 136 mmol/L (ref 135–145)
Total Bilirubin: 1 mg/dL (ref 0.3–1.2)
Total Protein: 6.9 g/dL (ref 6.5–8.1)

## 2022-09-30 LAB — CBC WITH DIFFERENTIAL/PLATELET
Abs Immature Granulocytes: 0.03 10*3/uL (ref 0.00–0.07)
Basophils Absolute: 0.1 10*3/uL (ref 0.0–0.1)
Basophils Relative: 1 %
Eosinophils Absolute: 0.1 10*3/uL (ref 0.0–0.5)
Eosinophils Relative: 1 %
HCT: 39.7 % (ref 36.0–46.0)
Hemoglobin: 13.1 g/dL (ref 12.0–15.0)
Immature Granulocytes: 0 %
Lymphocytes Relative: 31 %
Lymphs Abs: 3.5 10*3/uL (ref 0.7–4.0)
MCH: 20.8 pg — ABNORMAL LOW (ref 26.0–34.0)
MCHC: 33 g/dL (ref 30.0–36.0)
MCV: 62.9 fL — ABNORMAL LOW (ref 80.0–100.0)
Monocytes Absolute: 0.8 10*3/uL (ref 0.1–1.0)
Monocytes Relative: 7 %
Neutro Abs: 6.9 10*3/uL (ref 1.7–7.7)
Neutrophils Relative %: 60 %
Platelets: 217 10*3/uL (ref 150–400)
RBC: 6.31 MIL/uL — ABNORMAL HIGH (ref 3.87–5.11)
RDW: 16.9 % — ABNORMAL HIGH (ref 11.5–15.5)
WBC: 11.4 10*3/uL — ABNORMAL HIGH (ref 4.0–10.5)
nRBC: 0 % (ref 0.0–0.2)

## 2022-09-30 LAB — TSH: TSH: <0.01 u[IU]/mL — ABNORMAL LOW (ref 0.350–4.500)

## 2022-09-30 LAB — RESP PANEL BY RT-PCR (RSV, FLU A&B, COVID)  RVPGX2
Influenza A by PCR: NEGATIVE
Influenza B by PCR: NEGATIVE
Resp Syncytial Virus by PCR: NEGATIVE
SARS Coronavirus 2 by RT PCR: NEGATIVE

## 2022-09-30 LAB — T4, FREE: Free T4: >5.5 ng/dL — ABNORMAL HIGH (ref 0.61–1.12)

## 2022-09-30 LAB — MAGNESIUM: Magnesium: 1.4 mg/dL — ABNORMAL LOW (ref 1.7–2.4)

## 2022-09-30 LAB — TROPONIN I (HIGH SENSITIVITY): Troponin I (High Sensitivity): 27 ng/L — ABNORMAL HIGH (ref <18)

## 2022-09-30 MED ORDER — METOPROLOL TARTRATE 25 MG PO TABS
25.0000 mg | ORAL_TABLET | Freq: Once | ORAL | Status: AC
  Administered 2022-09-30: 25 mg via ORAL
  Filled 2022-09-30: qty 1

## 2022-09-30 MED ORDER — MAGNESIUM SULFATE 2 GM/50ML IV SOLN
2.0000 g | Freq: Once | INTRAVENOUS | Status: AC
  Administered 2022-09-30: 2 g via INTRAVENOUS
  Filled 2022-09-30: qty 50

## 2022-09-30 MED ORDER — POTASSIUM CHLORIDE 10 MEQ/100ML IV SOLN
10.0000 meq | INTRAVENOUS | Status: AC
  Administered 2022-09-30 (×2): 10 meq via INTRAVENOUS
  Filled 2022-09-30 (×2): qty 100

## 2022-09-30 MED ORDER — IOHEXOL 350 MG/ML SOLN
75.0000 mL | Freq: Once | INTRAVENOUS | Status: AC | PRN
  Administered 2022-09-30: 75 mL via INTRAVENOUS

## 2022-09-30 MED ORDER — METHIMAZOLE 10 MG PO TABS
10.0000 mg | ORAL_TABLET | Freq: Once | ORAL | Status: AC
  Administered 2022-09-30: 10 mg via ORAL
  Filled 2022-09-30: qty 1

## 2022-09-30 MED ORDER — AMLODIPINE BESYLATE 5 MG PO TABS: 5.0000 mg | ORAL_TABLET | Freq: Every day | ORAL | 2 refills | Status: AC

## 2022-09-30 MED ORDER — POTASSIUM CHLORIDE CRYS ER 20 MEQ PO TBCR
40.0000 meq | EXTENDED_RELEASE_TABLET | Freq: Once | ORAL | Status: AC
  Administered 2022-09-30: 40 meq via ORAL
  Filled 2022-09-30: qty 2

## 2022-09-30 MED ORDER — AMLODIPINE BESYLATE 5 MG PO TABS
5.0000 mg | ORAL_TABLET | Freq: Once | ORAL | Status: AC
  Administered 2022-09-30: 5 mg via ORAL
  Filled 2022-09-30: qty 1

## 2022-09-30 MED ORDER — METHIMAZOLE 10 MG PO TABS: 10.0000 mg | ORAL_TABLET | Freq: Two times a day (BID) | ORAL | 2 refills | Status: DC

## 2022-09-30 MED ORDER — METOPROLOL TARTRATE 25 MG PO TABS: 25.0000 mg | ORAL_TABLET | Freq: Two times a day (BID) | ORAL | 2 refills | Status: DC

## 2022-09-30 NOTE — ED Notes (Signed)
Pt falling asleep, placed on 2L o2 [place] due to sats dropping to 87%. Pt now stable. VS stable. Call bell in reach. Family @ bedside. No acute distress noted. Bed locked & low

## 2022-09-30 NOTE — ED Notes (Signed)
Pt report received from previous nurse. Pt A&O x4, vitals stable, denies needs/complaints. Call bell in reach. No acute distress noted.  

## 2022-09-30 NOTE — ED Notes (Signed)
Patient is being discharged from the Urgent Care and sent to the Emergency Department via car . Per Dr.banister, patient is in need of higher level of care due to rapid heart rate and SOB. Patient is aware and verbalizes understanding of plan of care.  Vitals:   09/30/22 1416  BP: (!) 153/88  Pulse: 76  Resp: 18  Temp: 98 F (36.7 C)  SpO2: 93%

## 2022-09-30 NOTE — ED Provider Notes (Signed)
Shannon Simpson    CSN: [id number] Arrival date & time: 09/30/22  1259      History   Chief Complaint Chief Complaint  Patient presents with   Cough   Shortness of Breath    HPI Shannon Simpson is a 62 y.o. female.    Cough Associated symptoms: shortness of breath   Shortness of Breath Associated symptoms: cough    Here for shortness of breath and cough and subjective fever.  She has been having the shortness of breath for about 3 weeks, ever since she ran out her medications that include amlodipine 5 mg, Tapazole, and metoprolol tartrate.  Her chest feels tight   She had run out of her medications before the shortness of breath began, and did not call anyone for refills, as she has not seen her new primary care provider yet.   Her family member translates for her  She was admitted to the hospital in December 2023.  She was there December 18 to December 23.  Discharge diagnoses included thyrotoxicosis, pneumonia, atrial fibrillation, and hypertension. 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   Hyperthyroidism 04/28/2019   Acute on chronic diastolic CHF (congestive heart failure) ([place]) 07/17/2018   Hypertensive urgency 07/17/2018   SIRS (systemic inflammatory response syndrome) ([place]) 09/04/2017   Acute respiratory failure with hypoxia ([place]) 09/04/2017   Hypertension 09/04/2017   Chronic diastolic heart failure ([place]) 09/04/2017   Mild pulmonary hypertension ([place]) 09/04/2017   Class 1  obesity due to excess calories with body mass index (BMI) of 32.0 to 32.9 in adult 09/04/2017   Microcytic anemia 09/04/2017   Obesity  10/06/2015   Hypertensive cardiovascular disease 10/06/2015   CHF (congestive heart failure) ([place]) 10/03/2015   Essential hypertension 10/03/2015   Microcytosis 10/03/2015   Hypertensive cardiomyopathy ([place]) 07/11/2015   Acute CHF (congestive heart failure) ([place]) 07/11/2015   Dyspnea on exertion 07/10/2015    Past Surgical History:  Procedure Laterality Date   APPENDECTOMY      OB History   No obstetric history on file.      Home Medications    Prior to Admission medications   Medication Sig Start Date End Date Taking? Authorizing Provider  amLODipine (NORVASC) 5 MG tablet Take 1 tablet (5 mg total) by mouth daily. 07/02/19   Lelon Perla, MD  methimazole (TAPAZOLE) 10 MG tablet Take 1 tablet (10 mg total) by mouth 2 (two) times daily. 04/28/19   Shamleffer, Melanie Crazier, MD  metoprolol tartrate (LOPRESSOR) 25 MG tablet Take 1 tablet (25 mg total) by mouth 2 (two) times daily. 07/02/19   Lelon Perla, MD    Family History Family History  Problem Relation Age of Onset   Heart disease Other        No family history   Diabetes Mellitus II Mother     Social History Social History   Tobacco Use   Smoking status: Never   Smokeless tobacco: Never  Vaping Use  Vaping Use: Never used  Substance Use Topics   Alcohol use: No   Drug use: No     Allergies   Patient has no known allergies.   Review of Systems Review of Systems  Respiratory:  Positive for cough and shortness of breath.      Physical Exam Triage Vital Signs ED Triage Vitals [09/30/22 1416]  Enc Vitals Group     BP (!) 153/88     Pulse Rate 76     Resp 18     Temp 98 F (36.7 C)     Temp Source Oral     SpO2 93 %     Weight      Height      Head Circumference      Peak Flow      Pain Score      Pain Loc      Pain Edu?      Excl. in [place]?     No data found.  Updated Vital Signs BP (!) 153/88 (BP Location: Right Arm)   Pulse 76   Temp 98 F (36.7 C) (Oral)   Resp 18   SpO2 93%   Visual Acuity Right Eye Distance:   Left Eye Distance:   Bilateral Distance:    Right Eye Near:   Left Eye Near:    Bilateral Near:     Physical Exam Vitals reviewed.  Constitutional:      Comments: Initially when nursing staff was doing her triage, her O2 sat was 88%.  By the time I got to the room it had improved to 92% on room air.  Heart rate on her digital pulse ox was in the 70s.  She is alert and oriented and speaks easily.  HENT:     Mouth/Throat:     Mouth: Mucous membranes are moist.  Eyes:     Extraocular Movements: Extraocular movements intact.     Conjunctiva/sclera: Conjunctivae normal.     Pupils: Pupils are equal, round, and reactive to light.  Cardiovascular:     Heart sounds: No murmur heard.    Comments: Heart rate is irregularly irregular and tachycardic, with an apical rate of about 130. Pulmonary:     Effort: No respiratory distress.     Breath sounds: No stridor. No wheezing or rales.  Musculoskeletal:     Cervical back: Neck supple.  Lymphadenopathy:     Cervical: No cervical adenopathy.  Skin:    Capillary Refill: Capillary refill takes less than 2 seconds.     Coloration: Skin is not pale.  Neurological:     General: No focal deficit present.     Mental Status: She is oriented to person, place, and time.  Psychiatric:        Behavior: Behavior normal.      UC Treatments / Results  Labs (all labs ordered are listed, but only abnormal results are displayed) Labs Reviewed - No data to display  EKG   Radiology No results found.  Procedures Procedures (including critical care time)  Medications Ordered in UC Medications - No data to display  Initial Impression / Assessment and Plan / UC Course  I have reviewed the triage vital signs and the nursing notes.  Pertinent labs & imaging  results that were available during my care of the patient were reviewed by me and considered in my medical decision making (see chart for details).        EKG shows atrial fibrillation with rapid response  with a rate of 137 at the time of the EKG. I have asked the patient to proceed to the emergency room for higher level of evaluation and treatment then we can provide in the urgent care setting.  Her family is driving and they are going to proceed with her to the emergency room by private car.  Her vitals otherwise at present are fairly reassuring with an O2 sat on room air at 93%. Final Clinical Impressions(s) / UC Diagnoses   Final diagnoses:  Shortness of breath  Atrial fibrillation with rapid ventricular response [facility])     Discharge Instructions      Patient is to proceed to the ER     ED Prescriptions   None    PDMP not reviewed this encounter.   Barrett Henle, MD 09/30/22 [phone]

## 2022-09-30 NOTE — ED Triage Notes (Addendum)
Pt has a cough with SOB x 3wks as per pt , body aches x 1 day . Pt needs a refill on Amlodipine Besylate 5mg  , TAPAZOLE, METOPROLOL TARTATE , chest feels tight . Fever on and off .

## 2022-09-30 NOTE — Discharge Instructions (Signed)
Patient is to proceed to the ER

## 2022-09-30 NOTE — ED Triage Notes (Signed)
Patient here with complaint of shortness of breath and body aches that started approximately two weeks ago, shortly after running out of her medications. Patient admitted to hospital for hyperthyroidism and atrial fibrillation in December and prescribed medications to regulate her thyroid and heart rhythm but the earliest appointment she was able to make with a new PCP is in May and she ran out of her medications. Afib with RVR rate of 150 in triage.

## 2022-09-30 NOTE — ED Provider Triage Note (Signed)
Emergency Medicine Provider Triage Evaluation Note  Shannon Simpson , a 62 y.o. female  was evaluated in triage.  Pt complains of chest pain and shortness of breath.  Patient was seen in urgent care today and was in A-fib at 137 bpm and referred to ED for further evaluation.  Patient states she has been short of breath along with bodyaches for the past 2 weeks.  Patient says she is not been taking her hyperthyroid medication or beta-blockers for the past month.  Patient denied hematemesis, unilateral leg swelling, recent hospitalizations or surgeries in past 60 days, recent travel, history of blood clots.  Patient denied fever/chills, nausea/vomiting  Review of Systems  Positive: See HPI Negative: See HPI  Physical Exam  BP 138/78 (BP Location: Right Arm)   Pulse 74   Temp 97.9 F (36.6 C)   Resp (!) 24   SpO2 93%  Gen:   Awake, no distress   Resp:  Normal effort  MSK:   Moves extremities without difficulty  Other:  No leg swelling, sensation/motor/pulses intact in all 4 limbs, abdomen nontender to palpation, tachycardic on exam  Medical Decision Making  Medically screening exam initiated at 3:00 PM.  Appropriate orders placed.  Shannon Simpson was informed that the remainder of the evaluation will be completed by another provider, this initial triage assessment does not replace that evaluation, and the importance of remaining in the ED until their evaluation is complete.  Workup initiated, due to patient being in A-fib RVR at the urgent care and initially in triage along with recent hospitalization a CTA PE study was ordered to evaluate for any possible PE causing patient's chest pain shortness of breath, patient stable at this time but is tachypneic at 24 breaths/min and O2 saturation is 93%.  Patient on oxygen.   Chuck Hint, PA-C 09/30/22 1511

## 2022-09-30 NOTE — ED Provider Notes (Signed)
[facility] Provider Note   CSN: [id number] Arrival date & time: 09/30/22  1443   Patient speaks [place].  Granddaughter at bedside agreeable to translate, patient agreeable with this.  History  Chief Complaint  Patient presents with   Atrial Fibrillation    Shannon Simpson is a 62 y.o. female with past medical history A-fib, heart failure, hypertension, hyperthyroidism who presents to the ED complaining of shortness of breath and chest pain for the last 3 to 4 weeks.  Patient was hospitalized in December 2023 per granddaughter was sent home with medications including metoprolol, methimazole, and amlodipine.  She was taking these medications as prescribed but has been unable to follow-up with primary care due to insurance/financial constraints.  Tinnitus, granddaughter reports that patient ran out of her medications about a month ago and had symptoms that since that time.  Her most significant symptom is her shortness of breath.  She has intermittent chest pain that is short-lived.  Currently chest pain-free but has had episodes today.  Reports increased lower extremity edema.  Granddaughter states that patient was evaluated at urgent care today and sent to the ED due to her hypertension and high heart rate.      Home Medications Prior to Admission medications   Medication Sig Start Date End Date Taking? Authorizing Provider  amLODipine (NORVASC) 5 MG tablet Take 1 tablet (5 mg total) by mouth daily. 09/30/22 12/29/22  Denney Shein L, PA-C  methimazole (TAPAZOLE) 10 MG tablet Take 1 tablet (10 mg total) by mouth 2 (two) times daily. 09/30/22 12/29/22  Avir Deruiter L, PA-C  metoprolol tartrate (LOPRESSOR) 25 MG tablet Take 1 tablet (25 mg total) by mouth 2 (two) times daily. 09/30/22 12/29/22  Quantez Schnyder, Arvella Merles, PA-C      Allergies    Patient has no known allergies.    Review of Systems   Review of Systems  All other systems reviewed and  are negative.   Physical Exam Updated Vital Signs BP (!) 177/81   Pulse 91   Temp 97.9 F (36.6 C)   Resp (!) 22   SpO2 95%  Physical Exam Vitals and nursing note reviewed.  Constitutional:      General: She is not in acute distress.    Appearance: Normal appearance. She is not ill-appearing, toxic-appearing or diaphoretic.  HENT:     Head: Normocephalic and atraumatic.     Mouth/Throat:     Mouth: Mucous membranes are moist.  Eyes:     Extraocular Movements: Extraocular movements intact.     Conjunctiva/sclera: Conjunctivae normal.     Pupils: Pupils are equal, round, and reactive to light.  Cardiovascular:     Rate and Rhythm: Tachycardia present. Rhythm irregular.     Heart sounds: No murmur heard. Pulmonary:     Effort: Pulmonary effort is normal. No respiratory distress.     Breath sounds: Normal breath sounds. No stridor. No wheezing, rhonchi or rales.  Abdominal:     General: Abdomen is flat. There is no distension.     Palpations: Abdomen is soft.     Tenderness: There is no abdominal tenderness. There is no guarding or rebound.  Musculoskeletal:        General: Normal range of motion.     Cervical back: Normal range of motion and neck supple. No rigidity.     Right lower leg: Edema (trace pitting, equal bilaterally) present.     Left lower leg: Edema (trace pitting, equal  bilaterally) present.  Skin:    General: Skin is warm and dry.     Capillary Refill: Capillary refill takes less than 2 seconds.  Neurological:     General: No focal deficit present.     Mental Status: She is alert. Mental status is at baseline.     Cranial Nerves: Cranial nerves 2-12 are intact.     Motor: Motor function is intact.     Coordination: Coordination is intact.     Gait: Gait is intact.  Psychiatric:        Behavior: Behavior normal.     ED Results / Procedures / Treatments   Labs (all labs ordered are listed, but only abnormal results are displayed) Labs Reviewed   COMPREHENSIVE METABOLIC PANEL - Abnormal; Notable for the following components:      Result Value   Potassium 3.0 (*)    CO2 21 (*)    Glucose, Bld 234 (*)    Albumin 2.8 (*)    Anion gap 16 (*)    All other components within normal limits  CBC WITH DIFFERENTIAL/PLATELET - Abnormal; Notable for the following components:   WBC 11.4 (*)    RBC 6.31 (*)    MCV 62.9 (*)    MCH 20.8 (*)    RDW 16.9 (*)    All other components within normal limits  TSH - Abnormal; Notable for the following components:   TSH <0.010 (*)    All other components within normal limits  TROPONIN I (HIGH SENSITIVITY) - Abnormal; Notable for the following components:   Troponin I (High Sensitivity) 27 (*)    All other components within normal limits  RESP PANEL BY RT-PCR (RSV, FLU A&B, COVID)  RVPGX2  MAGNESIUM  T4, FREE    EKG A-fib at 99 bpm, no STEMI  Radiology CT Angio Chest PE W/Cm &/Or Wo Cm  Result Date: 09/30/2022 CLINICAL DATA:  Chest pain and shortness of breath EXAM: CT ANGIOGRAPHY CHEST WITH CONTRAST TECHNIQUE: Multidetector CT imaging of the chest was performed using the standard protocol during bolus administration of intravenous contrast. Multiplanar CT image reconstructions and MIPs were obtained to evaluate the vascular anatomy. RADIATION DOSE REDUCTION: This exam was performed according to the departmental dose-optimization program which includes automated exposure control, adjustment of the mA and/or kV according to patient size and/or use of iterative reconstruction technique. CONTRAST:  9mL OMNIPAQUE IOHEXOL 350 MG/ML SOLN COMPARISON:  X-ray earlier 09/30/2022. CT angiogram 07/17/2018. Older exams as well. FINDINGS: Cardiovascular: Diffuse breathing motion. Study is nondiagnostic for small and peripheral emboli with this level of motion. No segmental or larger pulmonary embolism identified. There is some enlargement of the right pulmonary artery. Please correlate for any evidence of pulmonary  artery hypertension. This was seen previously. Mediastinum/Nodes: No specific abnormal lymph node enlargement identified in the axillary regions, hilum or mediastinum. There are some calcified lymph nodes along the mediastinum and left hilum consistent with old granulomatous disease. Normal caliber thoracic esophagus. No pericardial effusion. The heart enlarged. Lungs/Pleura: Few calcified lung nodules are identified consistent with old granulomatous disease. No consolidation, pleural effusion or pneumothorax. Upper Abdomen: Splenic granulomas are seen. The adrenal glands are incompletely included in the imaging field. Musculoskeletal: Curvature of the spine with some degenerative change. Review of the MIP images confirms the above findings. IMPRESSION: Significant breathing motion. This is not nondiagnostic for small and peripheral emboli. No segmental or larger pulmonary embolus. Persistent enlargement of the right main pulmonary artery greater than left. There is  volume loss of the left hemithorax compared to right. Evidence of old granulomatous disease. Aortic Atherosclerosis (ICD10-I70.0). Electronically Signed   By: Jill Side M.D.   On: 09/30/2022 18:14   DG Chest 2 View  Result Date: 09/30/2022 CLINICAL DATA:  Chest pain, shortness of breath, history sirs, CHF, hypertension, mild pulmonary hypertension, respiratory failure with hypoxia EXAM: CHEST - 2 VIEW COMPARISON:  07/01/2022 FINDINGS: Enlargement of cardiac silhouette. Enlarged central pulmonary arteries and pulmonary vascular congestion. Atherosclerotic calcification aorta. Bronchitic changes with mild chronic accentuation of RIGHT basilar interstitial markings. No acute infiltrate, pleural effusion, or pneumothorax. Bones demineralized with scoliosis and degenerative disc disease changes thoracic spine. IMPRESSION: Enlargement of cardiac silhouette with pulmonary vascular congestion. Enlarged central pulmonary arteries question pulmonary  arterial hypertension. Chronic bronchitic changes and accentuation of RIGHT basilar markings without definite acute infiltrate. Aortic Atherosclerosis (ICD10-I70.0). Electronically Signed   By: Lavonia Dana M.D.   On: 09/30/2022 15:34    Procedures Procedures    Medications Ordered in ED Medications  potassium chloride 10 mEq in 100 mL IVPB (10 mEq Intravenous New Bag/Given 09/30/22 1956)  metoprolol tartrate (LOPRESSOR) tablet 25 mg (25 mg Oral Given 09/30/22 1732)  methimazole (TAPAZOLE) tablet 10 mg (10 mg Oral Given 09/30/22 1815)  amLODipine (NORVASC) tablet 5 mg (5 mg Oral Given 09/30/22 1731)  potassium chloride SA (KLOR-CON M) CR tablet 40 mEq (40 mEq Oral Given 09/30/22 1731)  iohexol (OMNIPAQUE) 350 MG/ML injection 75 mL (75 mLs Intravenous Contrast Given 09/30/22 1759)    ED Course/ Medical Decision Making/ A&P                             Medical Decision Making Amount and/or Complexity of Data Reviewed Labs: ordered. Decision-making details documented in ED Course. Radiology: ordered. Decision-making details documented in ED Course. ECG/medicine tests: ordered. Decision-making details documented in ED Course.  Risk Prescription drug management.   Medical Decision Making:   Felicha Moffa is a 62 y.o. female who presented to the ED today with shortness of breath detailed above.    Additional history discussed with patient's family/caregivers.  Patient's presentation is complicated by their history of heart failure, hyperthyroidism, A-fib, medication nonadherence.  Patient placed on continuous vitals and telemetry monitoring while in ED which was reviewed periodically.  Complete initial physical exam performed, notably the patient  was in no acute distress.  She had a mild tachycardia up to 105 during my initial repeat examinations.  Heart rate was irregular consistent with A-fib.  Lungs were clear to auscultation and patient had no signs of respiratory distress.  She has  trace pitting edema to the bilateral lower extremities but they were nontender.  She is neurologically intact.    Reviewed and confirmed nursing documentation for past medical history, family history, social history.    Initial Assessment:   With the patient's presentation of shortness of breath, differential diagnosis includes but is not limited to A-fib with RVR, heart failure exacerbation, pneumonia, thyroid storm, sinus tachycardia, dehydration, electrolyte disturbance, acute kidney injury, angina, atypical chest pain, viral syndrome, PE. This is most consistent with an acute complicated illness  Initial Plan:  Screening labs including CBC and Metabolic panel to evaluate for infectious or metabolic etiology of disease.  Urinalysis with reflex culture ordered to evaluate for UTI or relevant urologic/nephrologic pathology.  CXR to evaluate for structural/infectious intrathoracic pathology.  EKG to evaluate for cardiac pathology Objective evaluation as reviewed   Initial  Study Results:   Laboratory  All laboratory results reviewed without evidence of clinically relevant pathology.   Exceptions include: K3.0, glucose 234, anion gap 16, troponin 27, WBC 11.4, TSH less than 0.010  EKG EKG was reviewed independently. ST segments without concerns for elevations.   EKG: atrial fibrillation, rate 99.   Radiology:  All images reviewed independently. Agree with radiology report at this time.   CT Angio Chest PE W/Cm &/Or Wo Cm  Result Date: 09/30/2022 CLINICAL DATA:  Chest pain and shortness of breath EXAM: CT ANGIOGRAPHY CHEST WITH CONTRAST TECHNIQUE: Multidetector CT imaging of the chest was performed using the standard protocol during bolus administration of intravenous contrast. Multiplanar CT image reconstructions and MIPs were obtained to evaluate the vascular anatomy. RADIATION DOSE REDUCTION: This exam was performed according to the departmental dose-optimization program which includes  automated exposure control, adjustment of the mA and/or kV according to patient size and/or use of iterative reconstruction technique. CONTRAST:  62mL OMNIPAQUE IOHEXOL 350 MG/ML SOLN COMPARISON:  X-ray earlier 09/30/2022. CT angiogram 07/17/2018. Older exams as well. FINDINGS: Cardiovascular: Diffuse breathing motion. Study is nondiagnostic for small and peripheral emboli with this level of motion. No segmental or larger pulmonary embolism identified. There is some enlargement of the right pulmonary artery. Please correlate for any evidence of pulmonary artery hypertension. This was seen previously. Mediastinum/Nodes: No specific abnormal lymph node enlargement identified in the axillary regions, hilum or mediastinum. There are some calcified lymph nodes along the mediastinum and left hilum consistent with old granulomatous disease. Normal caliber thoracic esophagus. No pericardial effusion. The heart enlarged. Lungs/Pleura: Few calcified lung nodules are identified consistent with old granulomatous disease. No consolidation, pleural effusion or pneumothorax. Upper Abdomen: Splenic granulomas are seen. The adrenal glands are incompletely included in the imaging field. Musculoskeletal: Curvature of the spine with some degenerative change. Review of the MIP images confirms the above findings. IMPRESSION: Significant breathing motion. This is not nondiagnostic for small and peripheral emboli. No segmental or larger pulmonary embolus. Persistent enlargement of the right main pulmonary artery greater than left. There is volume loss of the left hemithorax compared to right. Evidence of old granulomatous disease. Aortic Atherosclerosis (ICD10-I70.0). Electronically Signed   By: Jill Side M.D.   On: 09/30/2022 18:14   DG Chest 2 View  Result Date: 09/30/2022 CLINICAL DATA:  Chest pain, shortness of breath, history sirs, CHF, hypertension, mild pulmonary hypertension, respiratory failure with hypoxia EXAM: CHEST - 2  VIEW COMPARISON:  07/01/2022 FINDINGS: Enlargement of cardiac silhouette. Enlarged central pulmonary arteries and pulmonary vascular congestion. Atherosclerotic calcification aorta. Bronchitic changes with mild chronic accentuation of RIGHT basilar interstitial markings. No acute infiltrate, pleural effusion, or pneumothorax. Bones demineralized with scoliosis and degenerative disc disease changes thoracic spine. IMPRESSION: Enlargement of cardiac silhouette with pulmonary vascular congestion. Enlarged central pulmonary arteries question pulmonary arterial hypertension. Chronic bronchitic changes and accentuation of RIGHT basilar markings without definite acute infiltrate. Aortic Atherosclerosis (ICD10-I70.0). Electronically Signed   By: Lavonia Dana M.D.   On: 09/30/2022 15:34      Consults: Case discussed with transitions of care team who verified that patient has full Medicaid coverage and only needs a refill of her prescriptions while she is awaiting to establish primary care with an appointment scheduled in May 2024.   Final Assessment and Plan:   This is a 62 year old female who presents to the ED with her granddaughter for further evaluation of tachycardia and hypertension.  Patient has a known history of A-fib with RVR  for which she was hospitalized in December 2023.  She was discharged home on multiple medications and was compliant with these until running out of her refills from that hospitalization.  She is attempting to follow-up with PCP but has been unable to do so due to insurance constraints.  She does have an appointment scheduled in May 2024.  She comes to the ED today after being evaluated at urgent care where she was found to be tachycardic and hypertensive and complaining of chest pain for the last week.  She reports to me that she has mostly noted shortness of breath over the last week but will have intermittent, brief episodes of chest pain as well.  EKG shows A-fib but no acute ST-T  changes.  Workup obtained as above for further evaluation.  Chest x-ray without significant acute changes.  CT without PE, pneumonia, or other acute changes.  Patient has a hypokalemia which was repleted.  Otherwise, workup is fairly unremarkable.  Patient has a troponin of 27.  Previous troponins were higher than this patient did not have any EKG changes.  Suspect that this is secondary to demand.  Patient has had chest pain for over a week so do not see indication for repeat troponin today with this troponin and lack of EKG findings.  Discussed with attending physician who cosigned this note who was in agreement with plan.  TOC was consulted to help with prescription management but was told that patient will be able to obtain medications that she has a prescription.  New prescription sent in for her regular medications with 2 refills.  Patient educated on the importance of taking these medications to control her chronic conditions and other symptoms.  Given alternative follow-up with primary care if she so desires.  Pending magnesium and disposition and care transition to Dr. Godfrey Pick pending these results and anticipated discharge home.   Clinical Impression:  1. Atrial fibrillation with RVR ([place])   2. Essential hypertension   3. Nonspecific chest pain   4. Hypokalemia   5. Hyperthyroidism      Data Unavailable           Final Clinical Impression(s) / ED Diagnoses Final diagnoses:  Atrial fibrillation with RVR ([place])  Nonspecific chest pain  Hypokalemia  Hyperthyroidism    Rx / DC Orders ED Discharge Orders          Ordered    amLODipine (NORVASC) 5 MG tablet  Daily        09/30/22 1855    methimazole (TAPAZOLE) 10 MG tablet  2 times daily        09/30/22 1855    metoprolol tartrate (LOPRESSOR) 25 MG tablet  2 times daily        09/30/22 1855              Turner Daniels 09/30/22 2003    Godfrey Pick, MD 10/01/22 [phone]

## 2022-09-30 NOTE — ED Notes (Signed)
Patient is being discharged from the Urgent Care and sent to the Emergency Department via POV with family. Per Dr Windy Carina, patient is in need of higher level of care due to shortness at breath. Patient is aware and verbalizes understanding of plan of care.  Vitals:   09/30/22 1416  BP: (!) 153/88  Pulse: 76  Resp: 18  Temp: 98 F (36.7 C)  SpO2: 93%

## 2022-09-30 NOTE — Discharge Instructions (Signed)
Thank you for letting us take care of you today.  We prescribed your medications that you were taking from your previous hospitalization with 2 refills. It is very important to take these to control your chronic conditions and symptoms caused by those conditions such as your chest pain, shortness of breath. Please be sure to follow up with primary care as soon as possible. I have provided 2 additional primary care clinics you  may attempt to follow up with sooner than your current appointment in May.  For any new or worsening symptoms, please return to ED for re-evaluation.   ???????????????????????????????????.  ?????????????????????????????????????????????????????????????????????????? 2 refills. ??????????? ?? ???????????????????????????????????????????????????????????????????????????????????????????????????????: ????????????, ?????????. ?????????????????????????????????????????????????????????. ????????????????????????????????? 2 ???????????????????????????????????????????????????????????????????????????.  ????????????? ??????????????, ???????????????? ED ?????????????????.

## 2022-10-01 NOTE — ED Provider Notes (Signed)
Care of patient assumed from PA gallons.  With hypothyroidism and atrial fibrillation has been out of her medications and cannot get refills until she sees her primary care doctor in May.  Although she has some elevated heart rate earlier today, heart rate has been normal here.  She has received her home medications while in the ED.  Currently awaiting magnesium lab. Physical Exam  BP (!) 161/74   Pulse 96   Temp 98.3 F (36.8 C) (Oral)   Resp 17   SpO2 90%   Physical Exam Vitals and nursing note reviewed.  Constitutional:      General: She is not in acute distress.    Appearance: Normal appearance. She is well-developed. She is not ill-appearing, toxic-appearing or diaphoretic.  HENT:     Head: Normocephalic and atraumatic.     Nose: Nose normal.     Mouth/Throat:     Mouth: Mucous membranes are moist.  Eyes:     Extraocular Movements: Extraocular movements intact.     Conjunctiva/sclera: Conjunctivae normal.  Cardiovascular:     Rate and Rhythm: Normal rate and regular rhythm.  Pulmonary:     Effort: Pulmonary effort is normal. No respiratory distress.  Abdominal:     General: There is no distension.     Palpations: Abdomen is soft.  Musculoskeletal:        General: No swelling or deformity.     Cervical back: Neck supple.  Skin:    General: Skin is warm and dry.     Capillary Refill: Capillary refill takes less than 2 seconds.  Neurological:     General: No focal deficit present.     Mental Status: She is alert and oriented to person, place, and time.  Psychiatric:        Mood and Affect: Mood normal.        Behavior: Behavior normal.     Procedures  Procedures  ED Course / MDM    Medical Decision Making Amount and/or Complexity of Data Reviewed Labs: ordered.  Risk Prescription drug management.   Patient's magnesium was found to be low.  Replace magnesium was ordered.  Throughout her stay in the ED, she had normal sinus rhythm and normal heart rate.   Prescriptions for 90 days of her home medications were sent to her pharmacy.  Patient and daughter, who, is at bedside, to request discharge home.  Patient was discharged in stable condition.       Godfrey Pick, MD 10/01/22 [phone]

## 2023-06-10 ENCOUNTER — Inpatient Hospital Stay (HOSPITAL_COMMUNITY)
Admission: EM | Admit: 2023-06-10 | Discharge: 2023-06-14 | DRG: 291 | Disposition: A | Discharge status: Home / Self Care | Payer: Medicaid Other | Attending: Internal Medicine | Admitting: Internal Medicine

## 2023-06-10 ENCOUNTER — Other Ambulatory Visit: Payer: Self-pay

## 2023-06-10 ENCOUNTER — Emergency Department (HOSPITAL_COMMUNITY): Payer: Medicaid Other

## 2023-06-10 ENCOUNTER — Encounter (HOSPITAL_COMMUNITY): Payer: Self-pay | Admitting: Emergency Medicine

## 2023-06-10 DIAGNOSIS — E66811 Obesity, class 1: Secondary | ICD-10-CM | POA: Diagnosis present

## 2023-06-10 DIAGNOSIS — E1169 Type 2 diabetes mellitus with other specified complication: Secondary | ICD-10-CM | POA: Diagnosis present

## 2023-06-10 DIAGNOSIS — Z7901 Long term (current) use of anticoagulants: Secondary | ICD-10-CM

## 2023-06-10 DIAGNOSIS — Z7984 Long term (current) use of oral hypoglycemic drugs: Secondary | ICD-10-CM

## 2023-06-10 DIAGNOSIS — E039 Hypothyroidism, unspecified: Secondary | ICD-10-CM | POA: Diagnosis present

## 2023-06-10 DIAGNOSIS — E0591 Thyrotoxicosis, unspecified with thyrotoxic crisis or storm: Secondary | ICD-10-CM | POA: Diagnosis present

## 2023-06-10 DIAGNOSIS — Z833 Family history of diabetes mellitus: Secondary | ICD-10-CM

## 2023-06-10 DIAGNOSIS — E0590 Thyrotoxicosis, unspecified without thyrotoxic crisis or storm: Secondary | ICD-10-CM | POA: Diagnosis present

## 2023-06-10 DIAGNOSIS — I5033 Acute on chronic diastolic (congestive) heart failure: Principal | ICD-10-CM | POA: Diagnosis present

## 2023-06-10 DIAGNOSIS — I152 Hypertension secondary to endocrine disorders: Secondary | ICD-10-CM | POA: Diagnosis present

## 2023-06-10 DIAGNOSIS — Z79899 Other long term (current) drug therapy: Secondary | ICD-10-CM

## 2023-06-10 DIAGNOSIS — E876 Hypokalemia: Secondary | ICD-10-CM | POA: Diagnosis present

## 2023-06-10 DIAGNOSIS — E119 Type 2 diabetes mellitus without complications: Secondary | ICD-10-CM

## 2023-06-10 DIAGNOSIS — Z5986 Financial insecurity: Secondary | ICD-10-CM

## 2023-06-10 DIAGNOSIS — E1159 Type 2 diabetes mellitus with other circulatory complications: Secondary | ICD-10-CM | POA: Diagnosis present

## 2023-06-10 DIAGNOSIS — I509 Heart failure, unspecified: Principal | ICD-10-CM

## 2023-06-10 DIAGNOSIS — Z6831 Body mass index (BMI) 31.0-31.9, adult: Secondary | ICD-10-CM

## 2023-06-10 DIAGNOSIS — J9601 Acute respiratory failure with hypoxia: Secondary | ICD-10-CM | POA: Diagnosis not present

## 2023-06-10 DIAGNOSIS — D72829 Elevated white blood cell count, unspecified: Secondary | ICD-10-CM | POA: Diagnosis present

## 2023-06-10 DIAGNOSIS — I480 Paroxysmal atrial fibrillation: Secondary | ICD-10-CM | POA: Diagnosis present

## 2023-06-10 DIAGNOSIS — I2721 Secondary pulmonary arterial hypertension: Secondary | ICD-10-CM | POA: Diagnosis present

## 2023-06-10 DIAGNOSIS — E8809 Other disorders of plasma-protein metabolism, not elsewhere classified: Secondary | ICD-10-CM | POA: Diagnosis present

## 2023-06-10 HISTORY — DX: Type 2 diabetes mellitus without complications: E11.9

## 2023-06-10 HISTORY — DX: Paroxysmal atrial fibrillation: I48.0

## 2023-06-10 LAB — CBC
HCT: 40.1 % (ref 36.0–46.0)
Hemoglobin: 13.1 g/dL (ref 12.0–15.0)
MCH: 20.2 pg — ABNORMAL LOW (ref 26.0–34.0)
MCHC: 32.7 g/dL (ref 30.0–36.0)
MCV: 61.8 fL — ABNORMAL LOW (ref 80.0–100.0)
Platelets: 217 10*3/uL (ref 150–400)
RBC: 6.49 MIL/uL — ABNORMAL HIGH (ref 3.87–5.11)
RDW: 17.4 % — ABNORMAL HIGH (ref 11.5–15.5)
WBC: 10.8 10*3/uL — ABNORMAL HIGH (ref 4.0–10.5)
nRBC: 0 % (ref 0.0–0.2)

## 2023-06-10 LAB — BASIC METABOLIC PANEL
Anion gap: 11 (ref 5–15)
BUN: 12 mg/dL (ref 6–20)
CO2: 24 mmol/L (ref 22–32)
Calcium: 9.5 mg/dL (ref 8.9–10.3)
Chloride: 105 mmol/L (ref 98–111)
Creatinine, Ser: 0.53 mg/dL (ref 0.44–1.00)
GFR, Estimated: >60 mL/min (ref >60)
Glucose, Bld: 154 mg/dL — ABNORMAL HIGH (ref 70–99)
Potassium: 3.7 mmol/L (ref 3.5–5.1)
Sodium: 140 mmol/L (ref 135–145)

## 2023-06-10 LAB — TSH: TSH: <0.01 u[IU]/mL — ABNORMAL LOW (ref 0.350–4.500)

## 2023-06-10 LAB — TROPONIN I (HIGH SENSITIVITY)
Troponin I (High Sensitivity): 6 ng/L (ref <18)
Troponin I (High Sensitivity): 7 ng/L (ref <18)

## 2023-06-10 LAB — BRAIN NATRIURETIC PEPTIDE: B Natriuretic Peptide: 260.7 pg/mL — ABNORMAL HIGH (ref 0.0–100.0)

## 2023-06-10 LAB — T4, FREE: Free T4: 4.88 ng/dL — ABNORMAL HIGH (ref 0.61–1.12)

## 2023-06-10 MED ORDER — METHIMAZOLE 10 MG PO TABS
10.0000 mg | ORAL_TABLET | Freq: Once | ORAL | Status: AC
  Administered 2023-06-10: 10 mg via ORAL
  Filled 2023-06-10: qty 1

## 2023-06-10 MED ORDER — INSULIN ASPART 100 UNIT/ML IJ SOLN
0.0000 [IU] | Freq: Three times a day (TID) | INTRAMUSCULAR | Status: DC
  Administered 2023-06-11 (×2): 3 [IU] via SUBCUTANEOUS
  Administered 2023-06-12 – 2023-06-13 (×4): 2 [IU] via SUBCUTANEOUS
  Administered 2023-06-13 – 2023-06-14 (×4): 3 [IU] via SUBCUTANEOUS

## 2023-06-10 MED ORDER — METHIMAZOLE 10 MG PO TABS
10.0000 mg | ORAL_TABLET | Freq: Two times a day (BID) | ORAL | Status: DC
Start: 2023-06-11 — End: 2023-06-14
  Administered 2023-06-11 – 2023-06-14 (×7): 10 mg via ORAL
  Filled 2023-06-10 (×8): qty 1

## 2023-06-10 MED ORDER — METOPROLOL TARTRATE 25 MG PO TABS
25.0000 mg | ORAL_TABLET | Freq: Two times a day (BID) | ORAL | Status: DC
Start: 2023-06-10 — End: 2023-06-13
  Administered 2023-06-10 – 2023-06-13 (×6): 25 mg via ORAL
  Filled 2023-06-10 (×6): qty 1

## 2023-06-10 MED ORDER — ACETAMINOPHEN 650 MG RE SUPP: 650.0000 mg | Freq: Four times a day (QID) | RECTAL | Status: DC | PRN

## 2023-06-10 MED ORDER — ONDANSETRON HCL 4 MG PO TABS: 4.0000 mg | ORAL_TABLET | Freq: Four times a day (QID) | ORAL | Status: DC | PRN

## 2023-06-10 MED ORDER — AMLODIPINE BESYLATE 10 MG PO TABS
10.0000 mg | ORAL_TABLET | Freq: Every day | ORAL | Status: DC
Start: 2023-06-11 — End: 2023-06-14
  Administered 2023-06-11 – 2023-06-14 (×4): 10 mg via ORAL
  Filled 2023-06-10 (×4): qty 1

## 2023-06-10 MED ORDER — FUROSEMIDE 10 MG/ML IJ SOLN
40.0000 mg | Freq: Two times a day (BID) | INTRAMUSCULAR | Status: DC
Start: 2023-06-11 — End: 2023-06-12
  Administered 2023-06-11 – 2023-06-12 (×3): 40 mg via INTRAVENOUS
  Filled 2023-06-10 (×3): qty 4

## 2023-06-10 MED ORDER — SODIUM CHLORIDE 0.9% FLUSH
3.0000 mL | Freq: Two times a day (BID) | INTRAVENOUS | Status: DC
  Administered 2023-06-10 – 2023-06-13 (×7): 3 mL via INTRAVENOUS

## 2023-06-10 MED ORDER — ONDANSETRON HCL 4 MG/2ML IJ SOLN: 4.0000 mg | Freq: Four times a day (QID) | INTRAMUSCULAR | Status: DC | PRN

## 2023-06-10 MED ORDER — FUROSEMIDE 10 MG/ML IJ SOLN
40.0000 mg | Freq: Once | INTRAMUSCULAR | Status: AC
  Administered 2023-06-10: 40 mg via INTRAVENOUS
  Filled 2023-06-10: qty 4

## 2023-06-10 MED ORDER — SENNOSIDES-DOCUSATE SODIUM 8.6-50 MG PO TABS: 1.0000 | ORAL_TABLET | Freq: Every evening | ORAL | Status: DC | PRN

## 2023-06-10 MED ORDER — ENOXAPARIN SODIUM 40 MG/0.4ML IJ SOSY
40.0000 mg | PREFILLED_SYRINGE | INTRAMUSCULAR | Status: DC
  Administered 2023-06-11 – 2023-06-13 (×3): 40 mg via SUBCUTANEOUS
  Filled 2023-06-10 (×3): qty 0.4

## 2023-06-10 MED ORDER — ACETAMINOPHEN 325 MG PO TABS: 650.0000 mg | ORAL_TABLET | Freq: Four times a day (QID) | ORAL | Status: DC | PRN

## 2023-06-10 NOTE — ED Notes (Signed)
PT's IV was blown so I removed it.

## 2023-06-10 NOTE — Hospital Course (Addendum)
Shannon Simpson is a 62 y.o. female with medical history significant for chronic HFpEF (EF 55-60%), pulm HTN, PAF not on anticoagulation, hyperthyroidism, T2DM, HTN who is admitted with shortness of breath and found to have acute hypoxic respiratory failure due to acute on chronic HFpEF.    Currently she has been initiated on diuresis with IV Lasix 40 mg twice daily but will go up to 60 mg q12h. Of note she also has a leukocytosis of unclear etiology.  Have asked the nursing staff to wean her oxygen requirements that she is on 3 L of supplemental oxygen via nasal cannula but she desaturates on Room Air and will need continued Diruesis.  Given her slow improvement and continued volume overload with orthopnea cardiology's been consulted for further evaluation recommendations and they are recommending continue IV diuresis with Lasix 60 mg twice daily for now and wean down her oxygen as tolerated.  Cardiology feels that she is euvolemic now and has transitioned her off of IV diuretics to oral Lasix and added spironolactone 25 mg daily and Farxiga 10 mg p.o. daily.  She is also been changed to propranolol 40 g p.o. twice daily and anticoagulation has been initiated.  She was walked and desaturated and will need 2 L supplemental oxygen and will need to follow-up with PCP, cardiology, endocrinology, and recommend outpatient pulmonary referral.  She will also need to see oncology in outpatient setting given her chronically elevated leukocytosis since last December.  Assessment and Plan:  Acute Respiratory Failure with Hypoxia in the setting of Acute on Chronic HFpEF and associated Pulmonary Artery Hypertension: -Patient with new hypoxia with SpO2 down to 89% while at rest and reportedly desaturated to 82% with ambulation.  -Suspect secondary to acute on chronic HFpEF with likely PAH contributing.   -Has trace lower extremity edema and mildly elevated BNP at 260.7 and is now 72.2 SpO2: 98 % O2 Flow Rate  (L/min): 2 L/min -Continued IV Lasix 60 mg q12h and now cardiology recommending oral Lasix 40 mg daily for discharge and also adding spironolactone 25 mg p.o. daily and Farxiga 10 mg p.o. daily -Update ECHOCARDIOGRAM as below  -Metoprolol Tartrate  25 mg twice daily now being changed to propranolol 40 mg p.o. twice daily given cardiology recommendations -Continue supplemental oxygen via nasal cannula and wean O2 as tolerated to room air -Continuous pulse oximetry maintain O2 saturation greater than 90% -Patient will need an ambulatory home O2 screen prior to discharge and repeat chest x-ray in the a.m. -Strict I/O's and daily weights  Intake/Output Summary (Last 24 hours) at 06/14/2023 1709 Last data filed at 06/14/2023 1400 Gross per 24 hour  Intake 320 ml  Output 775 ml  Net -455 ml   -PT/OT To evaluate and Treat  -Given her slow improvement cardiology has been consulted for further evaluation and recommending continue diuresis today -Repeat ambulatory home O2 screen AM done and patient desaturated so we will discharge her on 2 L supplemental oxygen given that she is now euvolemic -Chest x-ray done and showed "Cardiomegaly with prominent central veins but no overt edema. Dilatation of the central pulmonary arteries chronically. Aortic atherosclerosis. Old granulomatous disease." -Follow-up with cardiology in outpatient setting and repeat chest x-ray within 3 to 6 weeks -Will need to see PCP and recommend having a pulmonary evaluation in outpatient setting   Hyperthyroidism: -Patient ran out of methimazole 1 week ago.  TSH <0.010, free T4 is 4.88. -Resume Methimazole 10 mg twice daily -Metoprolol Tartrate 25 mg po BID changed  to propranolol 40 mg p.o. twice daily -Patient has Endocrinology follow-up scheduled on 08/19/2022   Paroxysmal Atrial Fibrillation: -Currently Sinus rhythm with occasional PACs on admission.  -She is not on anticoagulation at home for unclear reasons.   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 twice daily given the patient's CHA2DS2-VASc score was 4; will continue anticoagulant for discharge -Continue to Monitor on Cardiac Telemetry.   Type 2 Diabetes Mellitus  -Hold Janumet while hospitalized and resume at discharge and now cardiology is adding Farxiga 10 mg p.o. daily -Now HbA1c is 7.5; Placed on Sensitive Novolog SSI AC while hospitalized -CBG Trend: Recent Labs  Lab 06/12/23 2059 06/13/23 0531 06/13/23 1154 06/13/23 1615 06/13/23 2114 06/14/23 0535 06/14/23 1055  GLUCAP 179* 217* 202* 210* 184* 207* 234*  -Follow up with Endocrinology in the outpatient setting    Hypertension: -Continue Metoprolol Tartrate 25 mg p.o. twice daily and Amlodipine 10 mg po Daily  -Currently getting diuresed -Continue to Monitor BP per Protocol -Last BP reading was 140/94  Leukocytosis, unclear etiology -Unclear etiology but likely reactive has been elevated since December 2023.  Has not been normal since December of last year with current WBC Trend: Recent Labs  Lab 06/10/23 1629 06/11/23 0252 06/12/23 0226 06/13/23 0240 06/14/23 0232  WBC 10.8* 14.1* 11.2* 12.2* 11.6*  -Continue to Monitor for S/Sx of Infection and will check blood cultures x 2 and urinalysis -UA negative for Infection and Blood Cx x2 showing NGTD at <24 hours -CXR this AM done and showed "No acute findings.  No change from the most recent prior study. Mild cardiomegaly and prominent central pulmonary vasculature." -Repeat CBC in the a.m. and monitor WBC trend and fever curve -Will need outpatient Hematology Evaluation and further workup  Hypokalemia -Patient's K+ Level Trend: Recent Labs  Lab 06/10/23 1629 06/11/23 0252 06/12/23 0226 06/13/23 0240 06/14/23 0232  K 3.7  3.1* 3.7 4.5 4.3  -Continue to Monitor and Replete as Necessary -Repeat CMP in the AM   Hypoalbuminemia -Patient's Albumin Trend: Recent Labs  Lab 06/11/23 0244 06/12/23 0226 06/13/23 0240 06/14/23 0232  ALBUMIN 3.4* 3.2* 3.4* 3.5  -Continue to Monitor and Trend and repeat CMP in the AM  Obesity -Complicates overall prognosis and care -Estimated body mass index is 30.44 kg/m as calculated from the following:   Height as of this encounter: 4\' 9"  (1.448 m).   Weight as of this encounter: 63.8 kg.  -Weight Loss and Dietary Counseling given

## 2023-06-10 NOTE — ED Triage Notes (Signed)
Pt here from sent from MD office for low sats , sats 90% on room air upon arrival , no chest pain no sob

## 2023-06-10 NOTE — ED Notes (Signed)
PT ambulated to the bathroom again and was placed back on 2l of oxygen

## 2023-06-10 NOTE — ED Notes (Signed)
Handoff report given to [facility] and she assumed primary care

## 2023-06-10 NOTE — ED Notes (Signed)
Put pt on 3L [place]. Pt was satting in upper 80's. PA Prosperi made aware

## 2023-06-10 NOTE — ED Notes (Signed)
ED TO INPATIENT HANDOFF REPORT  ED Nurse Name and Phone #: Trinna Post, RN 68  S Name/Age/Gender Shannon Simpson 62 y.o. female Room/Bed: 037C/037C  Code Status   Code Status: Prior  Home/SNF/Other Home Patient oriented to: self, place, time, and situation Is this baseline? Yes   Triage Complete: Triage complete  Chief Complaint Acute respiratory failure with hypoxia (HCC) [J96.01]  Triage Note Pt here from sent from MD office for low sats , sats 90% on room air upon arrival , no chest pain no sob    Allergies No Known Allergies  Level of Care/Admitting Diagnosis ED Disposition     ED Disposition  Admit   Condition  --   Comment  Hospital Area: [facility] [[id number]]  Level of Care: Telemetry Cardiac [[id number]]  May place patient in observation at [facility] or [facility] if equivalent level of care is available:: No  Covid Evaluation: Asymptomatic - no recent exposure (last 10 days) testing not required  Diagnosis: Acute respiratory failure with hypoxia [facility]) [[id number]]  Admitting Physician: Charlsie Quest [[id number]]  Attending Physician: Charlsie Quest [[id number]]          B Medical/Surgery History Past Medical History:  Diagnosis Date   CHF (congestive heart failure) (HCC)    Chronic diastolic heart failure (HCC)    Class 1 obesity with body mass index (BMI) of 32.0 to 32.9 in adult    Hypertension    Hyperthyroidism 04/28/2019   Microcytic anemia    Mild pulmonary hypertension (HCC)    Paroxysmal atrial fibrillation (HCC) 06/10/2023   Respiratory failure with hypoxia (HCC) 08/2017   SIRS (systemic inflammatory response syndrome) (HCC) 08/2017   Type 2 diabetes mellitus (HCC) 06/10/2023   Past Surgical History:  Procedure Laterality Date   APPENDECTOMY       A IV Location/Drains/Wounds Patient Lines/Drains/Airways Status     Active Line/Drains/Airways     None            Intake/Output Last 24 hours No intake or output  data in the 24 hours ending 06/10/23 2212  Labs/Imaging Results for orders placed or performed during the hospital encounter of 06/10/23 (from the past 48 hour(s))  Basic metabolic panel     Status: Abnormal   Collection Time: 06/10/23  4:29 PM  Result Value Ref Range   Sodium 140 135 - 145 mmol/L   Potassium 3.7 3.5 - 5.1 mmol/L   Chloride 105 98 - 111 mmol/L   CO2 24 22 - 32 mmol/L   Glucose, Bld 154 (H) 70 - 99 mg/dL    Comment: Glucose reference range applies only to samples taken after fasting for at least 8 hours.   BUN 12 6 - 20 mg/dL   Creatinine, Ser 1.02 0.44 - 1.00 mg/dL   Calcium 9.5 8.9 - 72.5 mg/dL   GFR, Estimated >36 >64 mL/min    Comment: (NOTE) Calculated using the CKD-EPI Creatinine Equation (2021)    Anion gap 11 5 - 15    Comment: Performed at [facility], [address], [place], [place] [address]  CBC     Status: Abnormal   Collection Time: 06/10/23  4:29 PM  Result Value Ref Range   WBC 10.8 (H) 4.0 - 10.5 K/uL   RBC 6.49 (H) 3.87 - 5.11 MIL/uL   Hemoglobin 13.1 12.0 - 15.0 g/dL   HCT 42.5 95.6 - 38.7 %   MCV 61.8 (L) 80.0 - 100.0 fL   MCH 20.2 (  L) 26.0 - 34.0 pg   MCHC 32.7 30.0 - 36.0 g/dL   RDW 04.5 (H) 40.9 - 81.1 %   Platelets 217 150 - 400 K/uL    Comment: REPEATED TO VERIFY   nRBC 0.0 0.0 - 0.2 %    Comment: Performed at [facility], [address] [place], [place] [address]  Troponin I (High Sensitivity)     Status: None   Collection Time: 06/10/23  4:29 PM  Result Value Ref Range   Troponin I (High Sensitivity) 6 <18 ng/L    Comment: (NOTE) Elevated high sensitivity troponin I (hsTnI) values and significant  changes across serial measurements may suggest ACS but many other  chronic and acute conditions are known to elevate hsTnI results.  Refer to the "Links" section for chest pain algorithms and additional  guidance. Performed at [facility], [address], [place], [place] [address]   Brain natriuretic peptide      Status: Abnormal   Collection Time: 06/10/23  4:29 PM  Result Value Ref Range   B Natriuretic Peptide 260.7 (H) 0.0 - 100.0 pg/mL    Comment: Performed at [facility], [address] [place], [place] [address]  TSH     Status: Abnormal   Collection Time: 06/10/23  4:29 PM  Result Value Ref Range   TSH <0.010 (L) 0.350 - 4.500 uIU/mL    Comment: Performed by a 3rd Generation assay with a functional sensitivity of <=0.01 uIU/mL. Performed at [facility], [address] [place], [place] [address]   T4, free     Status: Abnormal   Collection Time: 06/10/23  4:29 PM  Result Value Ref Range   Free T4 4.88 (H) 0.61 - 1.12 ng/dL    Comment: (NOTE) Biotin ingestion may interfere with free T4 tests. If the results are inconsistent with the TSH level, previous test results, or the clinical presentation, then consider biotin interference. If needed, order repeat testing after stopping biotin. Performed at [facility], [address] [place], [place] [address]   Troponin I (High Sensitivity)     Status: None   Collection Time: 06/10/23  6:53 PM  Result Value Ref Range   Troponin I (High Sensitivity) 7 <18 ng/L    Comment: (NOTE) Elevated high sensitivity troponin I (hsTnI) values and significant  changes across serial measurements may suggest ACS but many other  chronic and acute conditions are known to elevate hsTnI results.  Refer to the "Links" section for chest pain algorithms and additional  guidance. Performed at [facility], [address], [place], [place] [address]    DG Chest 2 View  Result Date: 06/10/2023 CLINICAL DATA:  62 year old female with shortness of breath. Hypoxia. EXAM: CHEST - 2 VIEW COMPARISON:  CTA chest 09/30/2022 and earlier. FINDINGS: AP and lateral views of the chest at 1700 hours. Stable cardiomegaly and mediastinal contours. Stable lung volumes. Visualized tracheal air column is within normal limits. Central pulmonary  artery enlargement is again evident. Pulmonary vascularity appears stable from earlier this year. No pneumothorax, pleural effusion or confluent lung opacity. No acute osseous abnormality identified. Negative visible bowel gas. IMPRESSION: Chronic cardiomegaly and suspected pulmonary artery hypertension. No acute cardiopulmonary abnormality. Electronically Signed   By: Odessa Fleming M.D.   On: 06/10/2023 17:42    Pending Labs Unresulted Labs (From admission, onward)    None       Vitals/Pain Today's Vitals   06/10/23  2015 06/10/23 2030 06/10/23 2115 06/10/23 2130  BP: (!) 148/75 (!) 153/119 (!) 134/91 (!) 146/87  Pulse: 88 90 97 94  Resp: (!) 24 16 (!) 22 16  Temp:      TempSrc:      SpO2: 97% 90% (!) 86% 98%  Weight:      Height:      PainSc:        Isolation Precautions No active isolations  Medications Medications  furosemide (LASIX) injection 40 mg (40 mg Intravenous Given 06/10/23 1952)  methimazole (TAPAZOLE) tablet 10 mg (10 mg Oral Given 06/10/23 2127)    Mobility walks     Focused Assessments     R Recommendations: See Admitting Provider Note  Report given to:   Additional Notes: On 3 L [place]

## 2023-06-10 NOTE — H&P (Signed)
History and Physical    Shannon Simpson MVH:[id number] DOB: 1961-03-21 DOA: 06/10/2023  PCP: Default, Provider, MD  Patient coming from: Home  I have personally briefly reviewed patient's old medical records in [facility]  Chief Complaint: Shortness of breath  HPI: Shannon Simpson is a 61 y.o. female with medical history significant for chronic HFpEF (EF 55-60%), pulm HTN, PAF not on anticoagulation, hyperthyroidism, T2DM, HTN who presents to the ED for evaluation of hypoxia.  Patient reports on and off episodes of shortness of breath.  She has had nonproductive cough.  She says she ran out of her methimazole 1 week ago.  She was seen by her PCP today and was noted to be hypoxic with SpO2 88% while on room air with reported tachycardia.  She was sent to the ED for further evaluation.  Patient denies fevers, chills, diaphoresis, chest pain, palpitations.  She denies any increased swelling of her extremities compared to baseline.  She denies nausea, vomiting, abdominal pain.  Reports good urine output.  ED Course  Labs/Imaging on admission: I have personally reviewed following labs and imaging studies.  Initial vitals showed BP 108/87, pulse 97, RR 18, temp 98.0 F, SpO2 90% on room air.  SpO2 dropped to 86% and patient subsequently placed on 3 L O2 via [place].  Labs show WBC 10.8, hemoglobin 13.1, platelets 217,000, sodium 140, potassium 3.7, bicarb 24, BUN 12, creatinine 0.53, serum glucose 154, troponin negative x 2.  BNP 260.7.  TSH <0.010.  Free T4 is 4.88.  2 view chest x-ray showed chronic cardiomegaly and pulmonary artery hypertension.  Patient was given IV Lasix 40 mg and oral methimazole 10 mg.  The hospitalist service was consulted to admit for further evaluation and management.  Review of Systems: All systems reviewed and are negative except as documented in history of present illness above.   Past Medical History:  Diagnosis Date   CHF (congestive heart  failure) (HCC)    Chronic diastolic heart failure (HCC)    Class 1 obesity with body mass index (BMI) of 32.0 to 32.9 in adult    Hypertension    Hyperthyroidism 04/28/2019   Microcytic anemia    Mild pulmonary hypertension (HCC)    Paroxysmal atrial fibrillation (HCC) 06/10/2023   Respiratory failure with hypoxia (HCC) 08/2017   SIRS (systemic inflammatory response syndrome) (HCC) 08/2017   Type 2 diabetes mellitus (HCC) 06/10/2023    Past Surgical History:  Procedure Laterality Date   APPENDECTOMY      Social History:  reports that she has never smoked. She has never used smokeless tobacco. She reports that she does not drink alcohol and does not use drugs.  No Known Allergies  Family History  Problem Relation Age of Onset   Heart disease Other        No family history   Diabetes Mellitus II Mother      Prior to Admission medications   Medication Sig Start Date End Date Taking? Authorizing Provider  amLODipine (NORVASC) 5 MG tablet Take 1 tablet (5 mg total) by mouth daily. 09/30/22 12/29/22  Gowens, Mariah L, PA-C  methimazole (TAPAZOLE) 10 MG tablet Take 1 tablet (10 mg total) by mouth 2 (two) times daily. 09/30/22 12/29/22  Gowens, Mariah L, PA-C  metoprolol tartrate (LOPRESSOR) 25 MG tablet Take 1 tablet (25 mg total) by mouth 2 (two) times daily. 09/30/22 12/29/22  Tonette Lederer, PA-C    Physical Exam: Vitals:   06/10/23 2030 06/10/23 2115 06/10/23 2130 06/10/23  2200  BP: (!) 153/119 (!) 134/91 (!) 146/87 124/75  Pulse: 90 97 94 91  Resp: 16 (!) 22 16 (!) 30  Temp:    98.1 F (36.7 C)  TempSrc:    Oral  SpO2: 90% (!) 86% 98% 98%  Weight:      Height:       Constitutional: Resting in bed, NAD, calm, comfortable Eyes: EOMI, lids and conjunctivae normal ENMT: Mucous membranes are moist. Posterior pharynx clear of any exudate or lesions.Normal dentition.  Neck: normal, supple, no masses. Respiratory: Faint bibasilar respiratory crackles. Normal respiratory  effort while on 3 L O2 via [place]. No accessory muscle use.  Cardiovascular: Regular rate and rhythm, no murmurs / rubs / gallops.  Trace bilateral lower extremity edema. 2+ pedal pulses. Abdomen: no tenderness, no masses palpated.  Musculoskeletal: no clubbing / cyanosis. No joint deformity upper and lower extremities. Good ROM, no contractures. Normal muscle tone.  Skin: no rashes, lesions, ulcers. No induration Neurologic:  Sensation intact. Strength 5/5 in all 4.  Psychiatric:  Alert and oriented x 3. Normal mood.   EKG: Personally reviewed. Sinus rhythm, rate 94, PACs.  Assessment/Plan Principal Problem:   Acute respiratory failure with hypoxia (HCC) Active Problems:   Acute on chronic heart failure with preserved ejection fraction (HFpEF) (HCC)   Hyperthyroidism   Paroxysmal atrial fibrillation (HCC)   Type 2 diabetes mellitus (HCC)   Hypertension associated with diabetes (HCC)   Shannon Simpson is a 62 y.o. female with medical history significant for chronic HFpEF (EF 55-60%), pulm HTN, PAF not on anticoagulation, hyperthyroidism, T2DM, HTN who is admitted with acute hypoxic respiratory failure due to acute on chronic HFpEF.  Assessment and Plan: Acute respiratory failure with hypoxia Acute on chronic HFpEF Pulmonary artery hypertension: Patient with new hypoxia with SpO2 down to 89% while at rest and reportedly desaturated to 82% with ambulation.  Suspect secondary to acute on chronic HFpEF with likely PAH contributing.  Has trace lower extremity edema and mildly elevated BNP. -Continue IV Lasix 40 mg twice daily -Update echocardiogram -Continue Lopressor 25 mg twice daily -Continue supplemental O2 as needed wean as able -Strict I/O's and daily weights  Hyperthyroidism: Patient ran out of methimazole 1 week ago.  TSH <0.010, free T4 is 4.88. -Resume methimazole 10 mg twice daily -Continue Lopressor -Patient has endocrinology follow-up scheduled on 06/19/2023  Paroxysmal  atrial fibrillation: Sinus rhythm with occasional PACs on admission.  She is not on anticoagulation at home.  Continue Lopressor.  Type 2 diabetes: Hold Janumet.  Placed on SSI.  Hypertension: Continue Lopressor and amlodipine.   DVT prophylaxis: enoxaparin (LOVENOX) injection 40 mg Start: 06/10/23 2230 Code Status: Full code, confirmed with patient on admission Family Communication: Daughter at bedside Disposition Plan: From home and likely discharge to home pending clinical progress Consults called: None Severity of Illness: The appropriate patient status for this patient is OBSERVATION. Observation status is judged to be reasonable and necessary in order to provide the required intensity of service to ensure the patient's safety. The patient's presenting symptoms, physical exam findings, and initial radiographic and laboratory data in the context of their medical condition is felt to place them at decreased risk for further clinical deterioration. Furthermore, it is anticipated that the patient will be medically stable for discharge from the hospital within 2 midnights of admission.   Darreld Mclean MD [facility]  If 7PM-7AM, please contact night-coverage [email or web address]  06/10/2023, 10:34 PM

## 2023-06-10 NOTE — ED Provider Notes (Signed)
[facility] EMERGENCY DEPARTMENT AT [facility] Provider Note   CSN: [id number] Arrival date & time: 06/10/23  1545     History  Chief Complaint  Patient presents with   Shortness of Breath    Shannon Simpson is a 62 y.o. female with past medical history seen for hypertension, pulmonary hypertension, CHF, obesity, hyperthyroidism who presents with concern for low oxygen saturation at PCPs office.  Her daughter reports that this often happens when she is out of her thyroid medication which she has been for 1 week.  She denies any chest pain, shortness of breath, although her daughter does endorse that she has been short of breath with the exertion of carrying her granddaughter for the last week or 2.  Patient sometimes feels like there is something stuck in the back of her throat, but denies any sore throat, cough, nausea, vomiting.   Shortness of Breath      Home Medications Prior to Admission medications   Medication Sig Start Date End Date Taking? Authorizing Provider  amLODipine (NORVASC) 5 MG tablet Take 1 tablet (5 mg total) by mouth daily. 09/30/22 12/29/22  Gowens, Mariah L, PA-C  methimazole (TAPAZOLE) 10 MG tablet Take 1 tablet (10 mg total) by mouth 2 (two) times daily. 09/30/22 12/29/22  Gowens, Mariah L, PA-C  metoprolol tartrate (LOPRESSOR) 25 MG tablet Take 1 tablet (25 mg total) by mouth 2 (two) times daily. 09/30/22 12/29/22  Gowens, Lawrence [place], PA-C      Allergies    Patient has no known allergies.    Review of Systems   Review of Systems  Respiratory:  Positive for shortness of breath.   All other systems reviewed and are negative.   Physical Exam Updated Vital Signs BP (!) 153/119   Pulse 90   Temp 97.8 F (36.6 C) (Oral)   Resp 16   Ht 4\' 9"  (1.448 m)   Wt 63.6 kg   SpO2 90%   BMI 30.34 kg/m  Physical Exam Vitals and nursing note reviewed.  Constitutional:      General: She is not in acute distress.    Appearance: Normal appearance.   HENT:     Head: Normocephalic and atraumatic.  Eyes:     General:        Right eye: No discharge.        Left eye: No discharge.  Cardiovascular:     Rate and Rhythm: Normal rate and regular rhythm.     Heart sounds: No murmur heard.    No friction rub. No gallop.  Pulmonary:     Effort: Pulmonary effort is normal.     Breath sounds: Normal breath sounds.     Comments: No wheezing, rhonchi, stridor, rales, question mild crackles at lung bases Abdominal:     General: Bowel sounds are normal.     Palpations: Abdomen is soft.  Musculoskeletal:     Comments: Mild around 1+ edema of lower extremities  Skin:    General: Skin is warm and dry.     Capillary Refill: Capillary refill takes less than 2 seconds.  Neurological:     Mental Status: She is alert and oriented to person, place, and time.  Psychiatric:        Mood and Affect: Mood normal.        Behavior: Behavior normal.     ED Results / Procedures / Treatments   Labs (all labs ordered are listed, but only abnormal results are displayed) Labs Reviewed  BASIC METABOLIC PANEL - Abnormal; Notable for the following components:      Result Value   Glucose, Bld 154 (*)    All other components within normal limits  CBC - Abnormal; Notable for the following components:   WBC 10.8 (*)    RBC 6.49 (*)    MCV 61.8 (*)    MCH 20.2 (*)    RDW 17.4 (*)    All other components within normal limits  BRAIN NATRIURETIC PEPTIDE - Abnormal; Notable for the following components:   B Natriuretic Peptide 260.7 (*)    All other components within normal limits  TSH - Abnormal; Notable for the following components:   TSH <0.010 (*)    All other components within normal limits  T4, FREE - Abnormal; Notable for the following components:   Free T4 4.88 (*)    All other components within normal limits  TROPONIN I (HIGH SENSITIVITY)  TROPONIN I (HIGH SENSITIVITY)    EKG EKG Interpretation Date/Time:  Tuesday June 10 2023 16:05:20  EST Ventricular Rate:  94 PR Interval:  160 QRS Duration:  90 QT Interval:  380 QTC Calculation: 475 R Axis:   69  Text Interpretation: Sinus rhythm with Premature atrial complexes Nonspecific ST abnormality Abnormal ECG When compared with ECG of 30-Sep-2022 14:35, No significant change since last tracing Confirmed by Alvino Blood ([id number]) on 06/10/2023 7:46:40 PM  Radiology DG Chest 2 View  Result Date: 06/10/2023 CLINICAL DATA:  62 year old female with shortness of breath. Hypoxia. EXAM: CHEST - 2 VIEW COMPARISON:  CTA chest 09/30/2022 and earlier. FINDINGS: AP and lateral views of the chest at 1700 hours. Stable cardiomegaly and mediastinal contours. Stable lung volumes. Visualized tracheal air column is within normal limits. Central pulmonary artery enlargement is again evident. Pulmonary vascularity appears stable from earlier this year. No pneumothorax, pleural effusion or confluent lung opacity. No acute osseous abnormality identified. Negative visible bowel gas. IMPRESSION: Chronic cardiomegaly and suspected pulmonary artery hypertension. No acute cardiopulmonary abnormality. Electronically Signed   By: Odessa Fleming M.D.   On: 06/10/2023 17:42    Procedures Procedures    Medications Ordered in ED Medications  methimazole (TAPAZOLE) tablet 10 mg (has no administration in time range)  furosemide (LASIX) injection 40 mg (40 mg Intravenous Given 06/10/23 1952)    ED Course/ Medical Decision Making/ A&P Clinical Course as of 06/10/23 2113  Tue Jun 10, 2023  2001 Patient is having hyperthyroid flare due to discontinuation of her home medication from running out.  Her troponins are negative and chest x-ray is fairly stable.  She did have to go on oxygen in the ED secondary to desaturation, we will attempt to wean her off of oxygen and see if she can ambulate without desaturation, if she cannot will recommend admission. [CP]  2050 Unable to ambulate without significant oxygen  desaturation, 82 to 87%.,  Will diuresis, administer home thyroid medications and consult for admission [CP]    Clinical Course User Index [CP] Jirah Rider, Harrel Carina, PA-C                                 Medical Decision Making Amount and/or Complexity of Data Reviewed Labs: ordered. Radiology: ordered.   This patient is a 62 y.o. female  who presents to the ED for concern of shob with exertion, hypoxia at PCP office.   Differential diagnoses prior to evaluation: The emergent differential diagnosis includes, but is  not limited to,  asthma exacerbation, COPD exacerbation, acute upper respiratory infection, acute bronchitis, chronic bronchitis, interstitial lung disease, ARDS, PE, pneumonia, atypical ACS, carbon monoxide poisoning, spontaneous pneumothorax, new CHF vs CHF exacerbation, versus other . This is not an exhaustive differential.   Past Medical History / Co-morbidities / Social History: Hypertension, pulmonary hypertension, heart failure, hypothyroidism, obesity  Additional history: Chart reviewed. Pertinent results include: Reviewed evaluation at PCP just prior to arrival as well as lab work, imaging from previous emergency department visits  Physical Exam: Physical exam performed. The pertinent findings include: Patient with mild tachypnea but otherwise without acute abnormality on Tory auscultation, does not seem to be in acute respiratory distress, no crackles on auscultation of lungs.  Other vital signs are stable other than some mild hypertension, blood pressure 156/72  Lab Tests/Imaging studies: I personally interpreted labs/imaging and the pertinent results include: TSH undetectable, T4 elevated at 4.88, BMP overall unremarkable, CBC with mild leukocytosis, white blood cells 10.8, negative troponin x 2..  I independently interpreted plain film chest x-ray which shows pulmonary vascular congestion, cardiomegaly.  I agree with the radiologist interpretation.  Cardiac  monitoring: EKG obtained and interpreted by myself and attending physician which shows: Normal sinus rhythm, nonspecific ST abnormality, no significant change from baseline   Medications: I ordered medication including Lasix for elevated BNP, and methimazole for uncontrolled hyperthyroidism.  I have reviewed the patients home medicines and have made adjustments as needed.   Consults: I spoke with the hospitalist, Dr Allena Katz, and after discussion of patient's lab work, imaging, new hypoxia, they recommended hospital admission.  Disposition: After consideration of the diagnostic results and the patients response to treatment, I feel that patient would benefit from hospital admission with plan as above.   Final Clinical Impression(s) / ED Diagnoses Final diagnoses:  None    Rx / DC Orders ED Discharge Orders     None         [place] 06/10/23 2113    Lonell Grandchild, MD 06/10/23 2240

## 2023-06-10 NOTE — ED Provider Triage Note (Signed)
Emergency Medicine Provider Triage Evaluation Note  Shannon Simpson , a 62 y.o. female  was evaluated in triage.  Patient states she was sent over by her PCP for a low oxygen level.  O2 90% on room air here.  She is breathing comfortably.  Denies any shortness of breath, chest pain.  She feels at her baseline.  Review of Systems  Positive: As above Negative: As above  Physical Exam  BP 108/87   Pulse 97   Temp 98 F (36.7 C) (Oral)   Resp 18   SpO2 90%  Gen:   Awake, no distress   Resp:  Normal effort  MSK:   Moves extremities without difficulty    Medical Decision Making  Medically screening exam initiated at 4:40 PM.  Appropriate orders placed.  Shannon Simpson was informed that the remainder of the evaluation will be completed by another provider, this initial triage assessment does not replace that evaluation, and the importance of remaining in the ED until their evaluation is complete.     Arabella Merles, PA-C 06/10/23 1641

## 2023-06-11 ENCOUNTER — Observation Stay (HOSPITAL_COMMUNITY): Payer: Medicaid Other

## 2023-06-11 DIAGNOSIS — I10 Essential (primary) hypertension: Secondary | ICD-10-CM | POA: Diagnosis not present

## 2023-06-11 DIAGNOSIS — I480 Paroxysmal atrial fibrillation: Secondary | ICD-10-CM | POA: Diagnosis present

## 2023-06-11 DIAGNOSIS — Z7984 Long term (current) use of oral hypoglycemic drugs: Secondary | ICD-10-CM | POA: Diagnosis not present

## 2023-06-11 DIAGNOSIS — Z5986 Financial insecurity: Secondary | ICD-10-CM | POA: Diagnosis not present

## 2023-06-11 DIAGNOSIS — I152 Hypertension secondary to endocrine disorders: Secondary | ICD-10-CM

## 2023-06-11 DIAGNOSIS — Z6831 Body mass index (BMI) 31.0-31.9, adult: Secondary | ICD-10-CM | POA: Diagnosis not present

## 2023-06-11 DIAGNOSIS — I5033 Acute on chronic diastolic (congestive) heart failure: Secondary | ICD-10-CM

## 2023-06-11 DIAGNOSIS — E0591 Thyrotoxicosis, unspecified with thyrotoxic crisis or storm: Secondary | ICD-10-CM | POA: Diagnosis present

## 2023-06-11 DIAGNOSIS — J9601 Acute respiratory failure with hypoxia: Secondary | ICD-10-CM

## 2023-06-11 DIAGNOSIS — E66811 Obesity, class 1: Secondary | ICD-10-CM | POA: Diagnosis present

## 2023-06-11 DIAGNOSIS — E1159 Type 2 diabetes mellitus with other circulatory complications: Secondary | ICD-10-CM

## 2023-06-11 DIAGNOSIS — Z833 Family history of diabetes mellitus: Secondary | ICD-10-CM | POA: Diagnosis not present

## 2023-06-11 DIAGNOSIS — E1169 Type 2 diabetes mellitus with other specified complication: Secondary | ICD-10-CM

## 2023-06-11 DIAGNOSIS — E0590 Thyrotoxicosis, unspecified without thyrotoxic crisis or storm: Secondary | ICD-10-CM

## 2023-06-11 DIAGNOSIS — Z7901 Long term (current) use of anticoagulants: Secondary | ICD-10-CM | POA: Diagnosis not present

## 2023-06-11 DIAGNOSIS — E876 Hypokalemia: Secondary | ICD-10-CM | POA: Diagnosis present

## 2023-06-11 DIAGNOSIS — D72829 Elevated white blood cell count, unspecified: Secondary | ICD-10-CM | POA: Diagnosis present

## 2023-06-11 DIAGNOSIS — I2721 Secondary pulmonary arterial hypertension: Secondary | ICD-10-CM | POA: Diagnosis present

## 2023-06-11 DIAGNOSIS — Z79899 Other long term (current) drug therapy: Secondary | ICD-10-CM | POA: Diagnosis not present

## 2023-06-11 DIAGNOSIS — E039 Hypothyroidism, unspecified: Secondary | ICD-10-CM | POA: Diagnosis present

## 2023-06-11 DIAGNOSIS — E8809 Other disorders of plasma-protein metabolism, not elsewhere classified: Secondary | ICD-10-CM | POA: Diagnosis present

## 2023-06-11 LAB — ECHOCARDIOGRAM COMPLETE
AV Mean grad: 7 mm[Hg]
AV Peak grad: 11.7 mm[Hg]
Ao pk vel: 1.71 m/s
Area-P 1/2: 3.05 cm2
Height: 57 in
P 1/2 time: 757 ms
S' Lateral: 2.8 cm
Weight: 2291.2 [oz_av]

## 2023-06-11 LAB — BASIC METABOLIC PANEL
Anion gap: 9 (ref 5–15)
BUN: 15 mg/dL (ref 6–20)
CO2: 26 mmol/L (ref 22–32)
Calcium: 9.3 mg/dL (ref 8.9–10.3)
Chloride: 104 mmol/L (ref 98–111)
Creatinine, Ser: 0.67 mg/dL (ref 0.44–1.00)
GFR, Estimated: >60 mL/min (ref >60)
Glucose, Bld: 145 mg/dL — ABNORMAL HIGH (ref 70–99)
Potassium: 3.1 mmol/L — ABNORMAL LOW (ref 3.5–5.1)
Sodium: 139 mmol/L (ref 135–145)

## 2023-06-11 LAB — CBC
HCT: 38.5 % (ref 36.0–46.0)
Hemoglobin: 12.8 g/dL (ref 12.0–15.0)
MCH: 20.5 pg — ABNORMAL LOW (ref 26.0–34.0)
MCHC: 33.2 g/dL (ref 30.0–36.0)
MCV: 61.7 fL — ABNORMAL LOW (ref 80.0–100.0)
Platelets: 216 10*3/uL (ref 150–400)
RBC: 6.24 MIL/uL — ABNORMAL HIGH (ref 3.87–5.11)
RDW: 16.8 % — ABNORMAL HIGH (ref 11.5–15.5)
WBC: 14.1 10*3/uL — ABNORMAL HIGH (ref 4.0–10.5)
nRBC: 0 % (ref 0.0–0.2)

## 2023-06-11 LAB — URINALYSIS, ROUTINE W REFLEX MICROSCOPIC
Bilirubin Urine: NEGATIVE
Glucose, UA: NEGATIVE mg/dL
Hgb urine dipstick: NEGATIVE
Ketones, ur: NEGATIVE mg/dL
Leukocytes,Ua: NEGATIVE
Nitrite: NEGATIVE
Protein, ur: 30 mg/dL — AB
Specific Gravity, Urine: 1.008 (ref 1.005–1.030)
pH: 6 (ref 5.0–8.0)

## 2023-06-11 LAB — GLUCOSE, CAPILLARY
Glucose-Capillary: 187 mg/dL — ABNORMAL HIGH (ref 70–99)
Glucose-Capillary: 214 mg/dL — ABNORMAL HIGH (ref 70–99)
Glucose-Capillary: 250 mg/dL — ABNORMAL HIGH (ref 70–99)
Glucose-Capillary: 146 mg/dL — ABNORMAL HIGH (ref 70–99)

## 2023-06-11 LAB — HEPATIC FUNCTION PANEL
ALT: 29 U/L (ref 0–44)
AST: 33 U/L (ref 15–41)
Albumin: 3.4 g/dL — ABNORMAL LOW (ref 3.5–5.0)
Alkaline Phosphatase: 72 U/L (ref 38–126)
Bilirubin, Direct: 0.2 mg/dL (ref 0.0–0.2)
Indirect Bilirubin: 0.6 mg/dL (ref 0.3–0.9)
Total Bilirubin: 0.8 mg/dL (ref <1.2)
Total Protein: 8.1 g/dL (ref 6.5–8.1)

## 2023-06-11 LAB — MAGNESIUM: Magnesium: 1.7 mg/dL (ref 1.7–2.4)

## 2023-06-11 LAB — PHOSPHORUS: Phosphorus: 4.1 mg/dL (ref 2.5–4.6)

## 2023-06-11 MED ORDER — POTASSIUM CHLORIDE CRYS ER 20 MEQ PO TBCR
40.0000 meq | EXTENDED_RELEASE_TABLET | Freq: Two times a day (BID) | ORAL | Status: AC
  Administered 2023-06-11 (×2): 40 meq via ORAL
  Filled 2023-06-11 (×2): qty 2

## 2023-06-11 MED ORDER — MAGNESIUM SULFATE 2 GM/50ML IV SOLN
2.0000 g | Freq: Once | INTRAVENOUS | Status: AC
  Administered 2023-06-11: 2 g via INTRAVENOUS
  Filled 2023-06-11: qty 50

## 2023-06-11 NOTE — Plan of Care (Signed)
  Problem: Skin Integrity: Goal: Risk for impaired skin integrity will decrease Outcome: Completed/Met   Problem: Elimination: Goal: Will not experience complications related to bowel motility Outcome: Completed/Met Goal: Will not experience complications related to urinary retention Outcome: Completed/Met   Problem: Pain Management: Goal: General experience of comfort will improve Outcome: Completed/Met   Problem: Safety: Goal: Ability to remain free from injury will improve Outcome: Completed/Met   Problem: Skin Integrity: Goal: Risk for impaired skin integrity will decrease Outcome: Completed/Met

## 2023-06-11 NOTE — Progress Notes (Signed)
PROGRESS NOTE    Shannon Simpson  AVW:[id number] DOB: February 26, 1961 DOA: 06/10/2023 PCP: Default, Provider, MD   Brief Narrative:  Shannon Simpson is a 62 y.o. female with medical history significant for chronic HFpEF (EF 55-60%), pulm HTN, PAF not on anticoagulation, hyperthyroidism, T2DM, HTN who is admitted with shortness of breath and found to have acute hypoxic respiratory failure due to acute on chronic HFpEF.  Currently she has been initiated on diuresis with IV Lasix 40 mg twice daily.  Of note she also has a leukocytosis of unclear etiology.  Have asked the nursing staff to wean her oxygen requirements that she is on 3 L of supplemental oxygen via nasal cannula.  Assessment and Plan:  Acute Respiratory Failure with Hypoxia Acute on Chronic HFpEF Pulmonary Artery Hypertension: -Patient with new hypoxia with SpO2 down to 89% while at rest and reportedly desaturated to 82% with ambulation.  -Suspect secondary to acute on chronic HFpEF with likely PAH contributing.   -Has trace lower extremity edema and mildly elevated BNP at 260.7. SpO2: 95 % O2 Flow Rate (L/min): 2 L/min -Continue IV Lasix 40 mg twice daily -Update ECHOCARDIOGRAM and currently done but pending read -Continue Lopressor 25 mg twice daily -Continue supplemental oxygen via nasal cannula and wean O2 as tolerated to room air -Continuous pulse oximetry maintain O2 saturation greater than 90% -Patient will need an ambulatory home O2 screen prior to discharge and repeat chest x-ray in the a.m. -Strict I/O's and daily weights  Intake/Output Summary (Last 24 hours) at 06/11/2023 1642 Last data filed at 06/11/2023 1553 Gross per 24 hour  Intake 290 ml  Output 276 ml  Net 14 ml   -PT/OT To evaluate and Treat    Hyperthyroidism: -Patient ran out of methimazole 1 week ago.  TSH <0.010, free T4 is 4.88. -Resume Methimazole 10 mg twice daily -Continue Metoprolol Tartrate 25 mg po BID -Patient has endocrinology  follow-up scheduled on 06/19/2023   Paroxysmal Atrial Fibrillation: -Currently Sinus rhythm with occasional PACs on admission.  -She is not on anticoagulation at home.   -Continue Metoprolol Tartrate 25 mg po BID -Continue to Monitor on Cardiac Telemetry.   Type 2 Diabetes Mellitus  -Hold Janumet.  Placed on Sensitive Novolog SSI AC -CBG Trend: Recent Labs  Lab 06/11/23 0615 06/11/23 1158  GLUCAP 187* 214*    Hypertension: Continue Lopressor and amlodipine. -Continue to Monitor BP per Protocol -Last BP reading was 116/75  Leukocytosis -Unclear etiology but likely reactive. WBC Trend: Recent Labs  Lab 06/10/23 1629 06/11/23 0252  WBC 10.8* 14.1*  -Continue to Monitor for S/Sx of Infection and will check blood cultures x 2 and urinalysis -Repeat CBC in the a.m. and monitor WBC trend and fever curve  Hypokalemia -Patient's K+ Level Trend: Recent Labs  Lab 06/10/23 1629 06/11/23 0252  K 3.7 3.1*  -Replete with po KCL 40 mEQ BID x2 -Continue to Monitor and Replete as Necessary -Repeat CMP in the AM   Hypoalbuminemia -Patient's Albumin Trend: Recent Labs  Lab 06/11/23 0244  ALBUMIN 3.4*  -Continue to Monitor and Trend and repeat CMP in the AM  Obesity -Complicates overall prognosis and care -Estimated body mass index is 30.99 kg/m as calculated from the following:   Height as of this encounter: 4\' 9"  (1.448 m).   Weight as of this encounter: 65 kg.  -Weight Loss and Dietary Counseling given   DVT prophylaxis: enoxaparin (LOVENOX) injection 40 mg Start: 06/11/23 1000    Code Status: Full Code Family Communication:  Discussed with the granddaughter at bedside  Disposition Plan:  Level of care: Telemetry Cardiac Status is: Observation The patient will require care spanning > 2 midnights and should be moved to inpatient because: Needs further diuresis and clinical improvement    Consultants:  None  Procedures:  Repeating echocardiogram which is still  pending  Antimicrobials:  Anti-infectives (From admission, onward)    None       Subjective: Seen and examined at bedside and was unable to get the [place] translator however patient does speak some English and granddaughter at bedside also translates.  Patient states that she is feeling less short of breath today and denies any swelling in her legs.  No nausea or vomiting.  Denies any chest pain or discomfort.  Have asked the nursing staff to try and wean her.  No other concerns or points this time.  Objective: Vitals:   06/11/23 1201 06/11/23 1552 06/11/23 1553 06/11/23 1554  BP: 116/75     Pulse: 83     Resp: 20     Temp: 98.1 F (36.7 C)     TempSrc: Oral     SpO2: 92% (!) 86% 93% 95%  Weight:      Height:        Intake/Output Summary (Last 24 hours) at 06/11/2023 1645 Last data filed at 06/11/2023 1553 Gross per 24 hour  Intake 290 ml  Output 276 ml  Net 14 ml   Filed Weights   06/10/23 1827 06/11/23 0435  Weight: 63.6 kg 65 kg   Examination: Physical Exam:  Constitutional: WN/WD obese evaluation female in no acute distress Respiratory: Diminished to auscultation bilaterally with some coarse breath sounds and has some slight crackles, no wheezing, rales, rhonchi or crackles. Normal respiratory effort and patient is not tachypenic. No accessory muscle use.  Unlabored breathing but is wearing supplemental oxygen nasal cannula Cardiovascular: RRR, no murmurs / rubs / gallops. S1 and S2 auscultated.  Trace lower extremity edema Abdomen: Soft, non-tender, distended secondary to body habitus. Bowel sounds positive.  GU: Deferred. Musculoskeletal: No clubbing / cyanosis of digits/nails. No joint deformity upper and lower extremities. Skin: No rashes, lesions, ulcers on the skin evaluation. No induration; Warm and dry.  Neurologic: CN 2-12 grossly intact with no focal deficits. Romberg sign and cerebellar reflexes not assessed.  Psychiatric: Normal judgment and insight.  Alert and oriented x 3. Normal mood and appropriate affect.   Data Reviewed: I have personally reviewed following labs and imaging studies  CBC: Recent Labs  Lab 06/10/23 1629 06/11/23 0252  WBC 10.8* 14.1*  HGB 13.1 12.8  HCT 40.1 38.5  MCV 61.8* 61.7*  PLT 217 216   Basic Metabolic Panel: Recent Labs  Lab 06/10/23 1629 06/11/23 0244 06/11/23 0252  NA 140  --  139  K 3.7  --  3.1*  CL 105  --  104  CO2 24  --  26  GLUCOSE 154*  --  145*  BUN 12  --  15  CREATININE 0.53  --  0.67  CALCIUM 9.5  --  9.3  MG  --   --  1.7  PHOS  --  4.1  --    GFR: Estimated Creatinine Clearance: 58.1 mL/min (by C-G formula based on SCr of 0.67 mg/dL). Liver Function Tests: Recent Labs  Lab 06/11/23 0244  AST 33  ALT 29  ALKPHOS 72  BILITOT 0.8  PROT 8.1  ALBUMIN 3.4*   No results for input(s): "LIPASE", "AMYLASE" in the  last 168 hours. No results for input(s): "AMMONIA" in the last 168 hours. Coagulation Profile: No results for input(s): "INR", "PROTIME" in the last 168 hours. Cardiac Enzymes: No results for input(s): "CKTOTAL", "CKMB", "CKMBINDEX", "TROPONINI" in the last 168 hours. BNP (last 3 results) No results for input(s): "PROBNP" in the last 8760 hours. HbA1C: No results for input(s): "HGBA1C" in the last 72 hours. CBG: Recent Labs  Lab 06/11/23 0615 06/11/23 1158  GLUCAP 187* 214*   Lipid Profile: No results for input(s): "CHOL", "HDL", "LDLCALC", "TRIG", "CHOLHDL", "LDLDIRECT" in the last 72 hours. Thyroid Function Tests: Recent Labs    06/10/23 1629  TSH <0.010*  FREET4 4.88*   Anemia Panel: No results for input(s): "VITAMINB12", "FOLATE", "FERRITIN", "TIBC", "IRON", "RETICCTPCT" in the last 72 hours. Sepsis Labs: No results for input(s): "PROCALCITON", "LATICACIDVEN" in the last 168 hours.  No results found for this or any previous visit (from the past 240 hour(s)).   Radiology Studies: DG Chest 2 View  Result Date: 06/10/2023 CLINICAL DATA:   62 year old female with shortness of breath. Hypoxia. EXAM: CHEST - 2 VIEW COMPARISON:  CTA chest 09/30/2022 and earlier. FINDINGS: AP and lateral views of the chest at 1700 hours. Stable cardiomegaly and mediastinal contours. Stable lung volumes. Visualized tracheal air column is within normal limits. Central pulmonary artery enlargement is again evident. Pulmonary vascularity appears stable from earlier this year. No pneumothorax, pleural effusion or confluent lung opacity. No acute osseous abnormality identified. Negative visible bowel gas. IMPRESSION: Chronic cardiomegaly and suspected pulmonary artery hypertension. No acute cardiopulmonary abnormality. Electronically Signed   By: Odessa Fleming M.D.   On: 06/10/2023 17:42    Scheduled Meds:  amLODipine  10 mg Oral Daily   enoxaparin (LOVENOX) injection  40 mg Subcutaneous Q24H   furosemide  40 mg Intravenous Q12H   insulin aspart  0-9 Units Subcutaneous TID WC   methimazole  10 mg Oral BID   metoprolol tartrate  25 mg Oral BID   potassium chloride  40 mEq Oral BID   sodium chloride flush  3 mL Intravenous Q12H   Continuous Infusions:   LOS: 0 days   Marguerita Merles, DO [facility] Available via Epic secure chat 7am-7pm After these hours, please refer to coverage provider listed on [email or web address] 06/11/2023, 4:45 PM

## 2023-06-11 NOTE — TOC Initial Note (Signed)
Transition of Care [facility]) - Initial/Assessment Note    Patient Details  Name: Shannon Simpson MRN: [id number] Date of Birth: August 23, 1960  [facility] CM/SW Contact:    Michaela Corner, LCSWA Phone Number: 06/11/2023, 3:46 PM  Clinical Narrative:     Patient had daughter answer the following questions. Dtr states patient lives at home with her spouse and other family members. Pt does not have a PCP and has no reported DME. Pt has no prior hx of HH. At time of dc, pts family can provide transportation.               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 Lives with:: Adult Children, Minor Children, Spouse Patient language and need for interpreter reviewed:: Yes (pt states they speak limited english) Do you feel safe going back to the place where you live?: Yes      Need for Family Participation in Patient Care: No (Comment) Care giver support system in place?: No (comment)   Criminal Activity/Legal Involvement Pertinent to Current Situation/Hospitalization: No - Comment as needed  Activities of Daily Living   ADL Screening (condition at time of admission) Independently performs ADLs?: Yes (appropriate for developmental age) Is the patient deaf or have difficulty hearing?: No Does the patient have difficulty seeing, even when wearing glasses/contacts?: No Does the patient have difficulty concentrating, remembering, or making decisions?: No  Permission Sought/Granted      Share Information with NAME: Dtr - Maly            Emotional Assessment Appearance:: Appears stated age Attitude/Demeanor/Rapport: Unable to Assess Affect (typically observed): Unable to Assess Orientation: : Oriented to Self, Oriented to Place, Oriented to  Time, Oriented to Situation Alcohol / Substance Use: Not Applicable Psych Involvement: No (comment)  Admission diagnosis:  Acute respiratory failure with hypoxia (HCC) [J96.01] Acute on chronic congestive heart failure, unspecified heart failure type [facility] [I50.9] Patient Active Problem List   Diagnosis Date Noted   Paroxysmal atrial fibrillation (HCC) 06/10/2023   Type 2 diabetes mellitus (HCC) 06/10/2023   Respiratory distress 07/02/2022   Thyrotoxicosis with thyrotoxic crisis 07/02/2022   Hyperthyroidism 04/28/2019   Acute on chronic heart failure with preserved ejection fraction (HFpEF) (HCC) 07/17/2018   Hypertensive urgency 07/17/2018   SIRS (systemic inflammatory response syndrome) (HCC) 09/04/2017   Acute respiratory failure with hypoxia (HCC) 09/04/2017   Hypertension associated with diabetes (HCC) 09/04/2017   Chronic diastolic heart failure (HCC) 09/04/2017   Mild pulmonary hypertension (HCC) 09/04/2017   Class 1 obesity due to excess calories with body mass index (BMI) of 32.0 to 32.9 in adult 09/04/2017   Microcytic anemia 09/04/2017   Obesity  10/06/2015   Hypertensive cardiovascular disease 10/06/2015   CHF (congestive heart failure) (HCC) 10/03/2015   Essential hypertension 10/03/2015   Microcytosis 10/03/2015   Hypertensive cardiomyopathy (HCC) 07/11/2015   Acute CHF (congestive heart failure) (HCC)  07/11/2015   Dyspnea on exertion 07/10/2015   PCP:  Default, Provider, MD Pharmacy:   [facility] [address] [place] - [address] [place] [address] Phone: [phone] Fax: [phone]     Social Determinants of Health (SDOH) Social History: SDOH Screenings   Food Insecurity: No Food  Insecurity (06/11/2023)  Housing: Low Risk  (06/11/2023)  Transportation Needs: No Transportation Needs (06/11/2023)  Utilities: Not At Risk (06/11/2023)  Tobacco Use: Low Risk  (06/10/2023)   SDOH Interventions:     Readmission Risk Interventions     No data to display

## 2023-06-11 NOTE — Plan of Care (Signed)
Problem: Coping: Goal: Ability to adjust to condition or change in health will improve Outcome: Progressing   Problem: Fluid Volume: Goal: Ability to maintain a balanced intake and output will improve Outcome: Progressing

## 2023-06-12 ENCOUNTER — Inpatient Hospital Stay (HOSPITAL_COMMUNITY): Payer: Medicaid Other

## 2023-06-12 DIAGNOSIS — E0590 Thyrotoxicosis, unspecified without thyrotoxic crisis or storm: Secondary | ICD-10-CM | POA: Diagnosis not present

## 2023-06-12 DIAGNOSIS — I2721 Secondary pulmonary arterial hypertension: Secondary | ICD-10-CM

## 2023-06-12 DIAGNOSIS — J9601 Acute respiratory failure with hypoxia: Secondary | ICD-10-CM | POA: Diagnosis not present

## 2023-06-12 DIAGNOSIS — I5033 Acute on chronic diastolic (congestive) heart failure: Secondary | ICD-10-CM | POA: Diagnosis not present

## 2023-06-12 DIAGNOSIS — I480 Paroxysmal atrial fibrillation: Secondary | ICD-10-CM | POA: Diagnosis not present

## 2023-06-12 LAB — CBC WITH DIFFERENTIAL/PLATELET
Abs Immature Granulocytes: 0 10*3/uL (ref 0.00–0.07)
Basophils Absolute: 0.1 10*3/uL (ref 0.0–0.1)
Basophils Relative: 1 %
Eosinophils Absolute: 0.8 10*3/uL — ABNORMAL HIGH (ref 0.0–0.5)
Eosinophils Relative: 7 %
HCT: 38.5 % (ref 36.0–46.0)
Hemoglobin: 12.8 g/dL (ref 12.0–15.0)
Lymphocytes Relative: 38 %
Lymphs Abs: 4.3 10*3/uL — ABNORMAL HIGH (ref 0.7–4.0)
MCH: 20.3 pg — ABNORMAL LOW (ref 26.0–34.0)
MCHC: 33.2 g/dL (ref 30.0–36.0)
MCV: 61.2 fL — ABNORMAL LOW (ref 80.0–100.0)
Monocytes Absolute: 0.3 10*3/uL (ref 0.1–1.0)
Monocytes Relative: 3 %
Neutro Abs: 5.7 10*3/uL (ref 1.7–7.7)
Neutrophils Relative %: 51 %
Platelets: 213 10*3/uL (ref 150–400)
RBC: 6.29 MIL/uL — ABNORMAL HIGH (ref 3.87–5.11)
RDW: 16.9 % — ABNORMAL HIGH (ref 11.5–15.5)
WBC: 11.2 10*3/uL — ABNORMAL HIGH (ref 4.0–10.5)
nRBC: 0 % (ref 0.0–0.2)
nRBC: 1 /100{WBCs} — ABNORMAL HIGH

## 2023-06-12 LAB — GLUCOSE, CAPILLARY
Glucose-Capillary: 174 mg/dL — ABNORMAL HIGH (ref 70–99)
Glucose-Capillary: 197 mg/dL — ABNORMAL HIGH (ref 70–99)
Glucose-Capillary: 151 mg/dL — ABNORMAL HIGH (ref 70–99)
Glucose-Capillary: 179 mg/dL — ABNORMAL HIGH (ref 70–99)

## 2023-06-12 LAB — COMPREHENSIVE METABOLIC PANEL
ALT: 24 U/L (ref 0–44)
AST: 21 U/L (ref 15–41)
Albumin: 3.2 g/dL — ABNORMAL LOW (ref 3.5–5.0)
Alkaline Phosphatase: 66 U/L (ref 38–126)
Anion gap: 7 (ref 5–15)
BUN: 23 mg/dL — ABNORMAL HIGH (ref 6–20)
CO2: 26 mmol/L (ref 22–32)
Calcium: 9.2 mg/dL (ref 8.9–10.3)
Chloride: 104 mmol/L (ref 98–111)
Creatinine, Ser: 0.91 mg/dL (ref 0.44–1.00)
GFR, Estimated: >60 mL/min (ref >60)
Glucose, Bld: 162 mg/dL — ABNORMAL HIGH (ref 70–99)
Potassium: 3.7 mmol/L (ref 3.5–5.1)
Sodium: 137 mmol/L (ref 135–145)
Total Bilirubin: 0.6 mg/dL (ref <1.2)
Total Protein: 7.1 g/dL (ref 6.5–8.1)

## 2023-06-12 LAB — HEMOGLOBIN A1C
Hgb A1c MFr Bld: 7.5 % — ABNORMAL HIGH (ref 4.8–5.6)
Mean Plasma Glucose: 169 mg/dL

## 2023-06-12 LAB — BRAIN NATRIURETIC PEPTIDE: B Natriuretic Peptide: 72.2 pg/mL (ref 0.0–100.0)

## 2023-06-12 LAB — MAGNESIUM: Magnesium: 1.8 mg/dL (ref 1.7–2.4)

## 2023-06-12 LAB — PHOSPHORUS: Phosphorus: 4.1 mg/dL (ref 2.5–4.6)

## 2023-06-12 MED ORDER — POTASSIUM CHLORIDE CRYS ER 20 MEQ PO TBCR
40.0000 meq | EXTENDED_RELEASE_TABLET | Freq: Once | ORAL | Status: AC
  Administered 2023-06-12: 40 meq via ORAL
  Filled 2023-06-12: qty 2

## 2023-06-12 MED ORDER — FUROSEMIDE 10 MG/ML IJ SOLN
60.0000 mg | Freq: Two times a day (BID) | INTRAMUSCULAR | Status: DC
  Administered 2023-06-12 – 2023-06-14 (×4): 60 mg via INTRAVENOUS
  Filled 2023-06-12 (×4): qty 6

## 2023-06-12 MED ORDER — MAGNESIUM SULFATE 2 GM/50ML IV SOLN
2.0000 g | Freq: Once | INTRAVENOUS | Status: AC
  Administered 2023-06-12: 2 g via INTRAVENOUS
  Filled 2023-06-12: qty 50

## 2023-06-12 NOTE — Plan of Care (Signed)
Problem: Coping: Goal: Ability to adjust to condition or change in health will improve Outcome: Progressing   Problem: Fluid Volume: Goal: Ability to maintain a balanced intake and output will improve Outcome: Progressing

## 2023-06-12 NOTE — Progress Notes (Signed)
PROGRESS NOTE    Camdyn Bousquet  WUJ:[id number] DOB: August 08, 1960 DOA: 06/10/2023 PCP: Default, Provider, MD   Brief Narrative:  Desira Litton is a 62 y.o. female with medical history significant for chronic HFpEF (EF 55-60%), pulm HTN, PAF not on anticoagulation, hyperthyroidism, T2DM, HTN who is admitted with shortness of breath and found to have acute hypoxic respiratory failure due to acute on chronic HFpEF.  Currently she has been initiated on diuresis with IV Lasix 40 mg twice daily.  Of note she also has a leukocytosis of unclear etiology.  Have asked the nursing staff to wean her oxygen requirements that she is on 3 L of supplemental oxygen via nasal cannula.  Assessment and Plan:  Acute Respiratory Failure with Hypoxia Acute on Chronic HFpEF Pulmonary Artery Hypertension: -Patient with new hypoxia with SpO2 down to 89% while at rest and reportedly desaturated to 82% with ambulation.  -Suspect secondary to acute on chronic HFpEF with likely PAH contributing.   -Has trace lower extremity edema and mildly elevated BNP at 260.7. SpO2: 96 % O2 Flow Rate (L/min): 2 L/min -Continue IV Lasix 40 mg twice daily -Update ECHOCARDIOGRAM and currently done but pending read -Continue Lopressor 25 mg twice daily -Continue supplemental oxygen via nasal cannula and wean O2 as tolerated to room air -Continuous pulse oximetry maintain O2 saturation greater than 90% -Patient will need an ambulatory home O2 screen prior to discharge and repeat chest x-ray in the a.m. -Strict I/O's and daily weights  Intake/Output Summary (Last 24 hours) at 06/12/2023 0911 Last data filed at 06/12/2023 0335 Gross per 24 hour  Intake 650 ml  Output 1101 ml  Net -451 ml   -PT/OT To evaluate and Treat    Hyperthyroidism: -Patient ran out of methimazole 1 week ago.  TSH <0.010, free T4 is 4.88. -Resume Methimazole 10 mg twice daily -Continue Metoprolol Tartrate 25 mg po BID -Patient has endocrinology  follow-up scheduled on 06/19/2023   Paroxysmal Atrial Fibrillation: -Currently Sinus rhythm with occasional PACs on admission.  -She is not on anticoagulation at home.   -Continue Metoprolol Tartrate 25 mg po BID -Continue to Monitor on Cardiac Telemetry.   Type 2 Diabetes Mellitus  -Hold Janumet.  Placed on Sensitive Novolog SSI AC -CBG Trend: Recent Labs  Lab 06/11/23 0615 06/11/23 1158 06/11/23 1655 06/11/23 2112 06/12/23 0610  GLUCAP 187* 214* 250* 146* 174*    Hypertension: Continue Lopressor and amlodipine. -Continue to Monitor BP per Protocol -Last BP reading was 116/75  Leukocytosis -Unclear etiology but likely reactive. WBC Trend: Recent Labs  Lab 06/10/23 1629 06/11/23 0252 06/12/23 0226  WBC 10.8* 14.1* 11.2*  -Continue to Monitor for S/Sx of Infection and will check blood cultures x 2 and urinalysis -Repeat CBC in the a.m. and monitor WBC trend and fever curve  Hypokalemia -Patient's K+ Level Trend: Recent Labs  Lab 06/10/23 1629 06/11/23 0252 06/12/23 0226  K 3.7 3.1* 3.7  -Replete with po KCL 40 mEQ BID x2 -Continue to Monitor and Replete as Necessary -Repeat CMP in the AM   Hypoalbuminemia -Patient's Albumin Trend: Recent Labs  Lab 06/11/23 0244 06/12/23 0226  ALBUMIN 3.4* 3.2*  -Continue to Monitor and Trend and repeat CMP in the AM  Obesity -Complicates overall prognosis and care -Estimated body mass index is 31.05 kg/m as calculated from the following:   Height as of this encounter: 4\' 9"  (1.448 m).   Weight as of this encounter: 65.1 kg.  -Weight Loss and Dietary Counseling given   DVT  prophylaxis: enoxaparin (LOVENOX) injection 40 mg Start: 06/11/23 1000    Code Status: Full Code Family Communication: Discussed with Daughter and Granddaughter at bedside  Disposition Plan:  Level of care: Telemetry Cardiac Status is: Inpatient Remains inpatient appropriate because: Needs further clinical improvement and further diuresis given  her hypoxia   Consultants:  None  Procedures:  ECHOCARDIOGRAM IMPRESSIONS     1. Left ventricular ejection fraction, by estimation, is 55 to 60%. The  left ventricle has normal function. The left ventricle has no regional  wall motion abnormalities. Left ventricular diastolic parameters were  normal.   2. Right ventricular systolic function is low normal. The right  ventricular size is normal. There is mildly elevated pulmonary artery  systolic pressure. The estimated right ventricular systolic pressure is  33.7 mmHg.   3. The mitral valve is normal in structure. Trivial mitral valve  regurgitation.   4. The aortic valve is tricuspid. There is mild thickening of the aortic  valve. Aortic valve regurgitation is trivial. Aortic valve sclerosis is  present, with no evidence of aortic valve stenosis.   5. The inferior vena cava is normal in size with greater than 50%  respiratory variability, suggesting right atrial pressure of 3 mmHg.   Comparison(s): Prior images reviewed side by side. Compared to the  previous study, there is less mitral insufficiency and the right heart  pressures are lower.   FINDINGS   Left Ventricle: Left ventricular ejection fraction, by estimation, is 55  to 60%. The left ventricle has normal function. The left ventricle has no  regional wall motion abnormalities. The left ventricular internal cavity  size was normal in size. There is   no left ventricular hypertrophy. Left ventricular diastolic parameters  were normal.   Right Ventricle: The right ventricular size is normal. Right vetricular  wall thickness was not well visualized. Right ventricular systolic  function is low normal. There is mildly elevated pulmonary artery systolic  pressure. The tricuspid regurgitant  velocity is 2.77 m/s, and with an assumed right atrial pressure of 3 mmHg,  the estimated right ventricular systolic pressure is 33.7 mmHg.   Left Atrium: Left atrial size was normal  in size.   Right Atrium: Right atrial size was normal in size.   Pericardium: There is no evidence of pericardial effusion.   Mitral Valve: The mitral valve is normal in structure. Trivial mitral  valve regurgitation.   Tricuspid Valve: The tricuspid valve is normal in structure. Tricuspid  valve regurgitation is mild.   Aortic Valve: The aortic valve is tricuspid. There is mild thickening of  the aortic valve. Aortic valve regurgitation is trivial. Aortic  regurgitation PHT measures 757 msec. Aortic valve sclerosis is present,  with no evidence of aortic valve stenosis.  Aortic valve mean gradient measures 7.0 mmHg. Aortic valve peak gradient  measures 11.7 mmHg.   Pulmonic Valve: The pulmonic valve was grossly normal. Pulmonic valve  regurgitation is mild. No evidence of pulmonic stenosis.   Aorta: The aortic root and ascending aorta are structurally normal, with  no evidence of dilitation.   Venous: The inferior vena cava is normal in size with greater than 50%  respiratory variability, suggesting right atrial pressure of 3 mmHg.   IAS/Shunts: The interatrial septum was not well visualized.     LEFT VENTRICLE  PLAX 2D  LVIDd:         4.40 cm Diastology  LVIDs:         2.80 cm LV e' medial:  7.00 cm/s  LV PW:         1.10 cm LV E/e' medial:  12.7  LV IVS:        0.90 cm LV e' lateral:   10.10 cm/s                         LV E/e' lateral: 8.8     RIGHT VENTRICLE  RV S prime:     10.70 cm/s  TAPSE (M-mode): 2.0 cm   LEFT ATRIUM             Index        RIGHT ATRIUM           Index  LA Vol (A2C):   62.0 ml 39.73 ml/m  RA Area:     18.40 cm  LA Vol (A4C):   73.7 ml 47.23 ml/m  RA Volume:   53.40 ml  34.22 ml/m  LA Biplane Vol: 71.4 ml 45.75 ml/m   AORTIC VALVE  AV Vmax:           171.00 cm/s  AV Vmean:          125.000 cm/s  AV VTI:            0.329 m  AV Peak Grad:      11.7 mmHg  AV Mean Grad:      7.0 mmHg  LVOT Vmax:         116.00 cm/s  LVOT Vmean:         90.000 cm/s  LVOT VTI:          0.246 m  LVOT/AV VTI ratio: 0.75  AI PHT:            757 msec    AORTA  Ao Root diam: 2.60 cm  Ao Asc diam:  3.90 cm   MITRAL VALVE               TRICUSPID VALVE  MV Area (PHT): 3.05 cm    TR Peak grad:   30.7 mmHg  MV Decel Time: 249 msec    TR Vmax:        277.00 cm/s  MV E velocity: 89.10 cm/s  MV A velocity: 78.60 cm/s  SHUNTS  MV E/A ratio:  1.13        Systemic VTI: 0.25 m   Antimicrobials:  Anti-infectives (From admission, onward)    None       Subjective: Seen and examined at bedside and was asleep and awoken from sleep and had no complaints.  Continues to desaturate a.m. and room air and in between her most pain was not attempted given that she is already in the low to mid 80s on room air.  Subsequently after the fluid family came back and patient requested to go home but I felt that she is not medically stable to go home at this time.  No other concerns or complaints at this time.  Objective: Vitals:   06/12/23 0918 06/12/23 0920 06/12/23 0921 06/12/23 1122  BP:    (!) 140/94  Pulse:  93  81  Resp: 20 20  20   Temp:    98.4 F (36.9 C)  TempSrc:    Oral  SpO2:  91% 95% (!) 89%  Weight:      Height:        Intake/Output Summary (Last 24 hours) at 06/12/2023 1506 Last data filed at 06/12/2023 1200 Gross per 24 hour  Intake 680 ml  Output 1250 ml  Net -570 ml   Filed Weights   06/10/23 1827 06/11/23 0435 06/12/23 0334  Weight: 63.6 kg 65 kg 65.1 kg   Examination: Physical Exam:  Constitutional: WN/WD obese female in no acute distress resting Respiratory: Diminished to auscultation bilaterally with some coarse breath sounds and has some slight crackles but no appreciable wheezing or rales or rhonchi. Normal respiratory effort and patient is not tachypenic. No accessory muscle use.  Wearing supplemental oxygen via nasal cannula but immediately desaturates when she is off of it Cardiovascular: RRR, no murmurs / rubs /  gallops. S1 and S2 auscultated.  1+ lower extremity edema Abdomen: Soft, non-tender, distended secondary to body habitus. Bowel sounds positive.  GU: Deferred. Musculoskeletal: No clubbing / cyanosis of digits/nails. No joint deformity upper and lower extremities.  Skin: No rashes, lesions, ulcers on limited skin evaluation. No induration; Warm and dry.  Neurologic: CN 2-12 grossly intact with no focal deficits. Romberg sign and cerebellar reflexes not assessed.  Psychiatric: Normal judgment and insight. Alert and oriented x 3. Normal mood and appropriate affect.   Data Reviewed: I have personally reviewed following labs and imaging studies  CBC: Recent Labs  Lab 06/10/23 1629 06/11/23 0252 06/12/23 0226  WBC 10.8* 14.1* 11.2*  NEUTROABS  --   --  5.7  HGB 13.1 12.8 12.8  HCT 40.1 38.5 38.5  MCV 61.8* 61.7* 61.2*  PLT 217 216 213   Basic Metabolic Panel: Recent Labs  Lab 06/10/23 1629 06/11/23 0244 06/11/23 0252 06/12/23 0226  NA 140  --  139 137  K 3.7  --  3.1* 3.7  CL 105  --  104 104  CO2 24  --  26 26  GLUCOSE 154*  --  145* 162*  BUN 12  --  15 23*  CREATININE 0.53  --  0.67 0.91  CALCIUM 9.5  --  9.3 9.2  MG  --   --  1.7 1.8  PHOS  --  4.1  --  4.1   GFR: Estimated Creatinine Clearance: 51.1 mL/min (by C-G formula based on SCr of 0.91 mg/dL). Liver Function Tests: Recent Labs  Lab 06/11/23 0244 06/12/23 0226  AST 33 21  ALT 29 24  ALKPHOS 72 66  BILITOT 0.8 0.6  PROT 8.1 7.1  ALBUMIN 3.4* 3.2*   No results for input(s): "LIPASE", "AMYLASE" in the last 168 hours. No results for input(s): "AMMONIA" in the last 168 hours. Coagulation Profile: No results for input(s): "INR", "PROTIME" in the last 168 hours. Cardiac Enzymes: No results for input(s): "CKTOTAL", "CKMB", "CKMBINDEX", "TROPONINI" in the last 168 hours. BNP (last 3 results) No results for input(s): "PROBNP" in the last 8760 hours. HbA1C: Recent Labs    06/11/23 0252  HGBA1C 7.5*    CBG: Recent Labs  Lab 06/11/23 1158 06/11/23 1655 06/11/23 2112 06/12/23 0610 06/12/23 1120  GLUCAP 214* 250* 146* 174* 197*   Lipid Profile: No results for input(s): "CHOL", "HDL", "LDLCALC", "TRIG", "CHOLHDL", "LDLDIRECT" in the last 72 hours. Thyroid Function Tests: Recent Labs    06/10/23 1629  TSH <0.010*  FREET4 4.88*   Anemia Panel: No results for input(s): "VITAMINB12", "FOLATE", "FERRITIN", "TIBC", "IRON", "RETICCTPCT" in the last 72 hours. Sepsis Labs: No results for input(s): "PROCALCITON", "LATICACIDVEN" in the last 168 hours.  Recent Results (from the past 240 hour(s))  Culture, blood (Routine X 2) w Reflex to ID Panel     Status: None (Preliminary result)  Collection Time: 06/11/23  5:24 PM   Specimen: BLOOD RIGHT HAND  Result Value Ref Range Status   Specimen Description BLOOD RIGHT HAND  Final   Special Requests   Final    BOTTLES DRAWN AEROBIC AND ANAEROBIC Blood Culture results may not be optimal due to an inadequate volume of blood received in culture bottles   Culture   Final    NO GROWTH < 24 HOURS Performed at [facility], [address] [place], [place] [address]    Report Status PENDING  Incomplete  Culture, blood (Routine X 2) w Reflex to ID Panel     Status: None (Preliminary result)   Collection Time: 06/11/23  5:24 PM   Specimen: BLOOD LEFT HAND  Result Value Ref Range Status   Specimen Description BLOOD LEFT HAND  Final   Special Requests   Final    BOTTLES DRAWN AEROBIC AND ANAEROBIC Blood Culture results may not be optimal due to an inadequate volume of blood received in culture bottles   Culture   Final    NO GROWTH < 24 HOURS Performed at [facility], [address], [place], [place] [address]    Report Status PENDING  Incomplete    Radiology Studies: DG CHEST PORT 1 VIEW  Result Date: 06/12/2023 CLINICAL DATA:  Short of breath. EXAM: PORTABLE CHEST 1 VIEW COMPARISON:  06/10/2023 and older studies. FINDINGS:  Mild enlargement the cardiac silhouette, stable. No mediastinal or hilar masses. Prominent central pulmonary vasculature. No evidence of pneumonia or pulmonary edema. No convincing pleural effusion and no pneumothorax. IMPRESSION: 1. No acute findings.  No change from the most recent prior study. 2. Mild cardiomegaly and prominent central pulmonary vasculature. Electronically Signed   By: Amie Portland M.D.   On: 06/12/2023 09:06   ECHOCARDIOGRAM COMPLETE  Result Date: 06/11/2023    ECHOCARDIOGRAM REPORT   Patient Name:   [facility] Date of Exam: 06/11/2023 Medical Rec #:  [id number]           Height:       57.0 in Accession #:    [id number]          Weight:       143.2 lb Date of Birth:  August 06, 1960          BSA:          1.561 m Patient Age:    61 years            BP:           139/74 mmHg Patient Gender: F                   HR:           85 bpm. Exam Location:  Inpatient Procedure: 2D Echo, Cardiac Doppler and Color Doppler Indications:    CHF  History:        Patient has prior history of Echocardiogram examinations, most                 recent 07/02/2022. CHF, Arrythmias:Atrial Fibrillation; Risk                 Factors:Diabetes.  Sonographer:    Darlys Gales Referring Phys: [id number] VISHAL R PATEL IMPRESSIONS  1. Left ventricular ejection fraction, by estimation, is 55 to 60%. The left ventricle has normal function. The left ventricle has no regional wall motion abnormalities. Left ventricular diastolic parameters were normal.  2. Right ventricular systolic function is  low normal. The right ventricular size is normal. There is mildly elevated pulmonary artery systolic pressure. The estimated right ventricular systolic pressure is 33.7 mmHg.  3. The mitral valve is normal in structure. Trivial mitral valve regurgitation.  4. The aortic valve is tricuspid. There is mild thickening of the aortic valve. Aortic valve regurgitation is trivial. Aortic valve sclerosis is present, with no evidence of aortic  valve stenosis.  5. The inferior vena cava is normal in size with greater than 50% respiratory variability, suggesting right atrial pressure of 3 mmHg. Comparison(s): Prior images reviewed side by side. Compared to the previous study, there is less mitral insufficiency and the right heart pressures are lower. FINDINGS  Left Ventricle: Left ventricular ejection fraction, by estimation, is 55 to 60%. The left ventricle has normal function. The left ventricle has no regional wall motion abnormalities. The left ventricular internal cavity size was normal in size. There is  no left ventricular hypertrophy. Left ventricular diastolic parameters were normal. Right Ventricle: The right ventricular size is normal. Right vetricular wall thickness was not well visualized. Right ventricular systolic function is low normal. There is mildly elevated pulmonary artery systolic pressure. The tricuspid regurgitant velocity is 2.77 m/s, and with an assumed right atrial pressure of 3 mmHg, the estimated right ventricular systolic pressure is 33.7 mmHg. Left Atrium: Left atrial size was normal in size. Right Atrium: Right atrial size was normal in size. Pericardium: There is no evidence of pericardial effusion. Mitral Valve: The mitral valve is normal in structure. Trivial mitral valve regurgitation. Tricuspid Valve: The tricuspid valve is normal in structure. Tricuspid valve regurgitation is mild. Aortic Valve: The aortic valve is tricuspid. There is mild thickening of the aortic valve. Aortic valve regurgitation is trivial. Aortic regurgitation PHT measures 757 msec. Aortic valve sclerosis is present, with no evidence of aortic valve stenosis. Aortic valve mean gradient measures 7.0 mmHg. Aortic valve peak gradient measures 11.7 mmHg. Pulmonic Valve: The pulmonic valve was grossly normal. Pulmonic valve regurgitation is mild. No evidence of pulmonic stenosis. Aorta: The aortic root and ascending aorta are structurally normal, with no  evidence of dilitation. Venous: The inferior vena cava is normal in size with greater than 50% respiratory variability, suggesting right atrial pressure of 3 mmHg. IAS/Shunts: The interatrial septum was not well visualized.  LEFT VENTRICLE PLAX 2D LVIDd:         4.40 cm Diastology LVIDs:         2.80 cm LV e' medial:    7.00 cm/s LV PW:         1.10 cm LV E/e' medial:  12.7 LV IVS:        0.90 cm LV e' lateral:   10.10 cm/s                        LV E/e' lateral: 8.8  RIGHT VENTRICLE RV S prime:     10.70 cm/s TAPSE (M-mode): 2.0 cm LEFT ATRIUM             Index        RIGHT ATRIUM           Index LA Vol (A2C):   62.0 ml 39.73 ml/m  RA Area:     18.40 cm LA Vol (A4C):   73.7 ml 47.23 ml/m  RA Volume:   53.40 ml  34.22 ml/m LA Biplane Vol: 71.4 ml 45.75 ml/m  AORTIC VALVE AV Vmax:  171.00 cm/s AV Vmean:          125.000 cm/s AV VTI:            0.329 m AV Peak Grad:      11.7 mmHg AV Mean Grad:      7.0 mmHg LVOT Vmax:         116.00 cm/s LVOT Vmean:        90.000 cm/s LVOT VTI:          0.246 m LVOT/AV VTI ratio: 0.75 AI PHT:            757 msec  AORTA Ao Root diam: 2.60 cm Ao Asc diam:  3.90 cm MITRAL VALVE               TRICUSPID VALVE MV Area (PHT): 3.05 cm    TR Peak grad:   30.7 mmHg MV Decel Time: 249 msec    TR Vmax:        277.00 cm/s MV E velocity: 89.10 cm/s MV A velocity: 78.60 cm/s  SHUNTS MV E/A ratio:  1.13        Systemic VTI: 0.25 m Rachelle Hora Croitoru MD Electronically signed by Thurmon Fair MD Signature Date/Time: 06/11/2023/5:18:14 PM    Final    DG Chest 2 View  Result Date: 06/10/2023 CLINICAL DATA:  62 year old female with shortness of breath. Hypoxia. EXAM: CHEST - 2 VIEW COMPARISON:  CTA chest 09/30/2022 and earlier. FINDINGS: AP and lateral views of the chest at 1700 hours. Stable cardiomegaly and mediastinal contours. Stable lung volumes. Visualized tracheal air column is within normal limits. Central pulmonary artery enlargement is again evident. Pulmonary vascularity  appears stable from earlier this year. No pneumothorax, pleural effusion or confluent lung opacity. No acute osseous abnormality identified. Negative visible bowel gas. IMPRESSION: Chronic cardiomegaly and suspected pulmonary artery hypertension. No acute cardiopulmonary abnormality. Electronically Signed   By: Odessa Fleming M.D.   On: 06/10/2023 17:42    Scheduled Meds:  amLODipine  10 mg Oral Daily   enoxaparin (LOVENOX) injection  40 mg Subcutaneous Q24H   furosemide  60 mg Intravenous Q12H   insulin aspart  0-9 Units Subcutaneous TID WC   methimazole  10 mg Oral BID   metoprolol tartrate  25 mg Oral BID   sodium chloride flush  3 mL Intravenous Q12H   Continuous Infusions:   LOS: 1 day   Marguerita Merles, DO [facility] Available via Epic secure chat 7am-7pm After these hours, please refer to coverage provider listed on [email or web address] 06/12/2023, 3:06 PM

## 2023-06-13 ENCOUNTER — Telehealth (HOSPITAL_COMMUNITY): Payer: Self-pay | Admitting: Pharmacy Technician

## 2023-06-13 ENCOUNTER — Inpatient Hospital Stay (HOSPITAL_COMMUNITY): Payer: Medicaid Other

## 2023-06-13 ENCOUNTER — Other Ambulatory Visit (HOSPITAL_COMMUNITY): Payer: Self-pay

## 2023-06-13 DIAGNOSIS — I10 Essential (primary) hypertension: Secondary | ICD-10-CM

## 2023-06-13 DIAGNOSIS — I5033 Acute on chronic diastolic (congestive) heart failure: Secondary | ICD-10-CM | POA: Diagnosis not present

## 2023-06-13 DIAGNOSIS — I480 Paroxysmal atrial fibrillation: Secondary | ICD-10-CM | POA: Diagnosis not present

## 2023-06-13 DIAGNOSIS — J9601 Acute respiratory failure with hypoxia: Secondary | ICD-10-CM | POA: Diagnosis not present

## 2023-06-13 DIAGNOSIS — E0590 Thyrotoxicosis, unspecified without thyrotoxic crisis or storm: Secondary | ICD-10-CM | POA: Diagnosis not present

## 2023-06-13 LAB — CBC WITH DIFFERENTIAL/PLATELET
Abs Immature Granulocytes: 0.04 10*3/uL (ref 0.00–0.07)
Basophils Absolute: 0.1 10*3/uL (ref 0.0–0.1)
Basophils Relative: 1 %
Eosinophils Absolute: 0.5 10*3/uL (ref 0.0–0.5)
Eosinophils Relative: 4 %
HCT: 42.2 % (ref 36.0–46.0)
Hemoglobin: 13.7 g/dL (ref 12.0–15.0)
Immature Granulocytes: 0 %
Lymphocytes Relative: 30 %
Lymphs Abs: 3.7 10*3/uL (ref 0.7–4.0)
MCH: 20.1 pg — ABNORMAL LOW (ref 26.0–34.0)
MCHC: 32.5 g/dL (ref 30.0–36.0)
MCV: 62.1 fL — ABNORMAL LOW (ref 80.0–100.0)
Monocytes Absolute: 1.6 10*3/uL — ABNORMAL HIGH (ref 0.1–1.0)
Monocytes Relative: 13 %
Neutro Abs: 6.3 10*3/uL (ref 1.7–7.7)
Neutrophils Relative %: 52 %
Platelets: 219 10*3/uL (ref 150–400)
RBC: 6.8 MIL/uL — ABNORMAL HIGH (ref 3.87–5.11)
RDW: 17.5 % — ABNORMAL HIGH (ref 11.5–15.5)
WBC: 12.2 10*3/uL — ABNORMAL HIGH (ref 4.0–10.5)
nRBC: 0 % (ref 0.0–0.2)

## 2023-06-13 LAB — COMPREHENSIVE METABOLIC PANEL
ALT: 23 U/L (ref 0–44)
AST: 22 U/L (ref 15–41)
Albumin: 3.4 g/dL — ABNORMAL LOW (ref 3.5–5.0)
Alkaline Phosphatase: 74 U/L (ref 38–126)
Anion gap: 10 (ref 5–15)
BUN: 26 mg/dL — ABNORMAL HIGH (ref 6–20)
CO2: 25 mmol/L (ref 22–32)
Calcium: 9.9 mg/dL (ref 8.9–10.3)
Chloride: 104 mmol/L (ref 98–111)
Creatinine, Ser: 0.69 mg/dL (ref 0.44–1.00)
GFR, Estimated: >60 mL/min (ref >60)
Glucose, Bld: 147 mg/dL — ABNORMAL HIGH (ref 70–99)
Potassium: 4.5 mmol/L (ref 3.5–5.1)
Sodium: 139 mmol/L (ref 135–145)
Total Bilirubin: 0.8 mg/dL (ref <1.2)
Total Protein: 7.6 g/dL (ref 6.5–8.1)

## 2023-06-13 LAB — GLUCOSE, CAPILLARY
Glucose-Capillary: 217 mg/dL — ABNORMAL HIGH (ref 70–99)
Glucose-Capillary: 202 mg/dL — ABNORMAL HIGH (ref 70–99)
Glucose-Capillary: 210 mg/dL — ABNORMAL HIGH (ref 70–99)
Glucose-Capillary: 184 mg/dL — ABNORMAL HIGH (ref 70–99)

## 2023-06-13 LAB — PHOSPHORUS: Phosphorus: 4.6 mg/dL (ref 2.5–4.6)

## 2023-06-13 LAB — MAGNESIUM: Magnesium: 2 mg/dL (ref 1.7–2.4)

## 2023-06-13 MED ORDER — PROPRANOLOL HCL 40 MG PO TABS
40.0000 mg | ORAL_TABLET | Freq: Two times a day (BID) | ORAL | Status: DC
  Administered 2023-06-13 – 2023-06-14 (×3): 40 mg via ORAL
  Filled 2023-06-13 (×4): qty 1

## 2023-06-13 MED ORDER — APIXABAN 5 MG PO TABS
5.0000 mg | ORAL_TABLET | Freq: Two times a day (BID) | ORAL | Status: DC
  Administered 2023-06-13 – 2023-06-14 (×3): 5 mg via ORAL
  Filled 2023-06-13 (×3): qty 1

## 2023-06-13 NOTE — Telephone Encounter (Signed)
Patient Product/process development scientist completed.    The patient is insured through [employer] and [employer] .     Ran test claim for Eliquis 5 mg and the current 30 day co-pay is $4.00.  Ran test claim for methimazole 10 mg and the current 30 day co-pay is $0.00.  This test claim was processed through [facility]- copay amounts may vary at other pharmacies due to pharmacy/plan contracts, or as the patient moves through the different stages of their insurance plan.     Roland Earl, CPHT Pharmacy Technician III Certified Patient Advocate [facility] Patient Advocate Team Direct Number: [phone]  Fax: [phone]

## 2023-06-13 NOTE — Discharge Instructions (Signed)
Information on my medicine - ELIQUIS® (apixaban) ° °This medication education was reviewed with me or my healthcare representative as part of my discharge preparation.  The pharmacist that spoke with me during my hospital stay was:  Sheranda Seabrooks C, RPH ° °Why was Eliquis® prescribed for you? °Eliquis® was prescribed for you to reduce the risk of a blood clot forming that can cause a stroke if you have a medical condition called atrial fibrillation (a type of irregular heartbeat). ° °What do You need to know about Eliquis® ? °Take your Eliquis® TWICE DAILY - one tablet in the morning and one tablet in the evening with or without food. If you have difficulty swallowing the tablet whole please discuss with your pharmacist how to take the medication safely. ° °Take Eliquis® exactly as prescribed by your doctor and DO NOT stop taking Eliquis® without talking to the doctor who prescribed the medication.  Stopping may increase your risk of developing a stroke.  Refill your prescription before you run out. ° °After discharge, you should have regular check-up appointments with your healthcare provider that is prescribing your Eliquis®.  In the future your dose may need to be changed if your kidney function or weight changes by a significant amount or as you get older. ° °What do you do if you miss a dose? °If you miss a dose, take it as soon as you remember on the same day and resume taking twice daily.  Do not take more than one dose of ELIQUIS at the same time to make up a missed dose. ° °Important Safety Information °A possible side effect of Eliquis® is bleeding. You should call your healthcare provider right away if you experience any of the following: °? Bleeding from an injury or your nose that does not stop. °? Unusual colored urine (red or dark brown) or unusual colored stools (red or black). °? Unusual bruising for unknown reasons. °? A serious fall or if you hit your head (even if there is no bleeding). ° °Some  medicines may interact with Eliquis® and might increase your risk of bleeding or clotting while on Eliquis®. To help avoid this, consult your healthcare provider or pharmacist prior to using any new prescription or non-prescription medications, including herbals, vitamins, non-steroidal anti-inflammatory drugs (NSAIDs) and supplements. ° °This website has more information on Eliquis® (apixaban): http://www.eliquis.com/eliquis/home °

## 2023-06-13 NOTE — Progress Notes (Signed)
Mobility Specialist Progress Note:  Nurse requested Mobility Specialist to perform oxygen saturation test with pt which includes removing pt from oxygen both at rest and while ambulating.  Below are the results from that testing.     Patient Saturations on Room Air at Rest = spO2 88% O2 flow increased to 2L/min SPO2 98%  Patient Saturations on 2 Liters of oxygen while Ambulating = sp02 98%   Attempted to ambulate on Room Air since SPO2 remained at 98% on 2L/min. SPO2 WFL at 93%  At end of testing pt left in room on 2  Liters of oxygen. Per RN request  Reported results to nurse.    Thompson Grayer Mobility Specialist  Please contact vis Secure Chat or  Rehab Office [phone]

## 2023-06-13 NOTE — Progress Notes (Signed)
PROGRESS NOTE    Shannon Simpson  ZOX:[id number] DOB: Jul 16, 1960 DOA: 06/10/2023 PCP: Default, Provider, MD   Brief Narrative:  Shannon Simpson is a 62 y.o. female with medical history significant for chronic HFpEF (EF 55-60%), pulm HTN, PAF not on anticoagulation, hyperthyroidism, T2DM, HTN who is admitted with shortness of breath and found to have acute hypoxic respiratory failure due to acute on chronic HFpEF.    Currently she has been initiated on diuresis with IV Lasix 40 mg twice daily but will go up to 60 mg q12h. Of note she also has a leukocytosis of unclear etiology.  Have asked the nursing staff to wean her oxygen requirements that she is on 3 L of supplemental oxygen via nasal cannula but she desaturates on Room Air and will need continued Diruesis.  Given her slow improvement and continued volume overload with orthopnea cardiology's been consulted for further evaluation recommendations and they are recommending continue IV diuresis with Lasix 60 mg twice daily for now and wean down her oxygen as tolerated  Assessment and Plan:  Acute Respiratory Failure with Hypoxia in the setting of Acute on Chronic HFpEF and associated Pulmonary Artery Hypertension: -Patient with new hypoxia with SpO2 down to 89% while at rest and reportedly desaturated to 82% with ambulation.  -Suspect secondary to acute on chronic HFpEF with likely PAH contributing.   -Has trace lower extremity edema and mildly elevated BNP at 260.7 and is now 72.2 SpO2: 94 % O2 Flow Rate (L/min): 2 L/min -Continue IV Lasix 60 mg q12h -Update ECHOCARDIOGRAM as below  -Continue Metoprolol Tartrate  25 mg twice daily -Continue supplemental oxygen via nasal cannula and wean O2 as tolerated to room air -Continuous pulse oximetry maintain O2 saturation greater than 90% -Patient will need an ambulatory home O2 screen prior to discharge and repeat chest x-ray in the a.m. -Strict I/O's and daily weights  Intake/Output  Summary (Last 24 hours) at 06/13/2023 1444 Last data filed at 06/13/2023 1122 Gross per 24 hour  Intake 880 ml  Output 1900 ml  Net -1020 ml   -PT/OT To evaluate and Treat  -Given her slow improvement cardiology has been consulted for further evaluation and recommending continue diuresis today -Repeat ambulatory home O2 screen in a.m. as patient desaturated today and repeat chest x-ray in a.m. -Will need an Ambulatory home O2 screen prior to discharge and will repeat in the morning   Hyperthyroidism: -Patient ran out of methimazole 1 week ago.  TSH <0.010, free T4 is 4.88. -Resume Methimazole 10 mg twice daily -Continue Metoprolol Tartrate 25 mg po BID -Patient has endocrinology follow-up scheduled on 06/19/2023   Paroxysmal Atrial Fibrillation: -Currently Sinus rhythm with occasional PACs on admission.  -She is not on anticoagulation at home for unclear reasons.   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 twice daily given the patient's CHA2DS2-VASc score was 4 -Continue to Monitor on Cardiac Telemetry.   Type 2 Diabetes Mellitus  -Hold Janumet.  Placed on Sensitive Novolog SSI AC -CBG Trend: Recent Labs  Lab 06/11/23 2112 06/12/23 0610 06/12/23 1120 06/12/23 1709 06/12/23 2059 06/13/23 0531 06/13/23 1154  GLUCAP 146* 174* 197* 151* 179* 217* 202*    Hypertension: -Continue Metoprolol Tartrate 25 mg p.o. twice daily and Amlodipine 10 mg po Daily  -Currently getting diuresed -Continue to Monitor BP  per Protocol -Last BP reading was 140/94  Leukocytosis, unclear etiology -Unclear etiology but likely reactive has been elevated since December 2023.  Has not been normal since December of last year with current WBC Trend: Recent Labs  Lab 06/10/23 1629 06/11/23 0252  06/12/23 0226 06/13/23 0240  WBC 10.8* 14.1* 11.2* 12.2*  -Continue to Monitor for S/Sx of Infection and will check blood cultures x 2 and urinalysis -UA negative for Infection and Blood Cx x2 showing NGTD at <24 hours -CXR this AM done and showed "No acute findings.  No change from the most recent prior study. Mild cardiomegaly and prominent central pulmonary vasculature." -Repeat CBC in the a.m. and monitor WBC trend and fever curve -Will need outpatient Hematology Evaluation and further workup  Hypokalemia -Patient's K+ Level Trend: Recent Labs  Lab 06/10/23 1629 06/11/23 0252 06/12/23 0226 06/13/23 0240  K 3.7 3.1* 3.7 4.5  -Replete with po KCL 40 mEQ x1 -Continue to Monitor and Replete as Necessary -Repeat CMP in the AM   Hypoalbuminemia -Patient's Albumin Trend: Recent Labs  Lab 06/11/23 0244 06/12/23 0226 06/13/23 0240  ALBUMIN 3.4* 3.2* 3.4*  -Continue to Monitor and Trend and repeat CMP in the AM  Obesity -Complicates overall prognosis and care -Estimated body mass index is 30.53 kg/m as calculated from the following:   Height as of this encounter: 4\' 9"  (1.448 m).   Weight as of this encounter: 64 kg.  -Weight Loss and Dietary Counseling given   DVT prophylaxis:  apixaban (ELIQUIS) tablet 5 mg    Code Status: Full Code Family Communication: Discussed with Granddaughter at bedside  Disposition Plan:  Level of care: Telemetry Cardiac Status is: Inpatient Remains inpatient appropriate because: Needs further clinical improvement and Cardiac Clearance   Consultants:  Cardiology  Procedures:  ECHOCARDIOGRAM IMPRESSIONS     1. Left ventricular ejection fraction, by estimation, is 55 to 60%. The  left ventricle has normal function. The left ventricle has no regional  wall motion abnormalities. Left ventricular diastolic parameters were  normal.   2. Right ventricular systolic function is low normal. The right  ventricular size is normal. There is  mildly elevated pulmonary artery  systolic pressure. The estimated right ventricular systolic pressure is  33.7 mmHg.   3. The mitral valve is normal in structure. Trivial mitral valve  regurgitation.   4. The aortic valve is tricuspid. There is mild thickening of the aortic  valve. Aortic valve regurgitation is trivial. Aortic valve sclerosis is  present, with no evidence of aortic valve stenosis.   5. The inferior vena cava is normal in size with greater than 50%  respiratory variability, suggesting right atrial pressure of 3 mmHg.   Comparison(s): Prior images reviewed side by side. Compared to the  previous study, there is less mitral insufficiency and the right heart  pressures are lower.   FINDINGS   Left Ventricle: Left ventricular ejection fraction, by estimation, is 55  to 60%. The left ventricle has normal function. The left ventricle has no  regional wall motion abnormalities. The left ventricular internal cavity  size was normal in size. There is   no left ventricular hypertrophy. Left ventricular diastolic parameters  were normal.   Right Ventricle: The right ventricular size is normal. Right vetricular  wall thickness was not well visualized. Right ventricular systolic  function is low normal. There is mildly elevated pulmonary artery systolic  pressure. The tricuspid regurgitant  velocity is 2.77 m/s, and with an assumed right atrial pressure  of 3 mmHg,  the estimated right ventricular systolic pressure is 33.7 mmHg.   Left Atrium: Left atrial size was normal in size.   Right Atrium: Right atrial size was normal in size.   Pericardium: There is no evidence of pericardial effusion.   Mitral Valve: The mitral valve is normal in structure. Trivial mitral  valve regurgitation.   Tricuspid Valve: The tricuspid valve is normal in structure. Tricuspid  valve regurgitation is mild.   Aortic Valve: The aortic valve is tricuspid. There is mild thickening of  the aortic  valve. Aortic valve regurgitation is trivial. Aortic  regurgitation PHT measures 757 msec. Aortic valve sclerosis is present,  with no evidence of aortic valve stenosis.  Aortic valve mean gradient measures 7.0 mmHg. Aortic valve peak gradient  measures 11.7 mmHg.   Pulmonic Valve: The pulmonic valve was grossly normal. Pulmonic valve  regurgitation is mild. No evidence of pulmonic stenosis.   Aorta: The aortic root and ascending aorta are structurally normal, with  no evidence of dilitation.   Venous: The inferior vena cava is normal in size with greater than 50%  respiratory variability, suggesting right atrial pressure of 3 mmHg.   IAS/Shunts: The interatrial septum was not well visualized.     LEFT VENTRICLE  PLAX 2D  LVIDd:         4.40 cm Diastology  LVIDs:         2.80 cm LV e' medial:    7.00 cm/s  LV PW:         1.10 cm LV E/e' medial:  12.7  LV IVS:        0.90 cm LV e' lateral:   10.10 cm/s                         LV E/e' lateral: 8.8     RIGHT VENTRICLE  RV S prime:     10.70 cm/s  TAPSE (M-mode): 2.0 cm   LEFT ATRIUM             Index        RIGHT ATRIUM           Index  LA Vol (A2C):   62.0 ml 39.73 ml/m  RA Area:     18.40 cm  LA Vol (A4C):   73.7 ml 47.23 ml/m  RA Volume:   53.40 ml  34.22 ml/m  LA Biplane Vol: 71.4 ml 45.75 ml/m   AORTIC VALVE  AV Vmax:           171.00 cm/s  AV Vmean:          125.000 cm/s  AV VTI:            0.329 m  AV Peak Grad:      11.7 mmHg  AV Mean Grad:      7.0 mmHg  LVOT Vmax:         116.00 cm/s  LVOT Vmean:        90.000 cm/s  LVOT VTI:          0.246 m  LVOT/AV VTI ratio: 0.75  AI PHT:            757 msec    AORTA  Ao Root diam: 2.60 cm  Ao Asc diam:  3.90 cm   MITRAL VALVE               TRICUSPID VALVE  MV Area (PHT): 3.05 cm  TR Peak grad:   30.7 mmHg  MV Decel Time: 249 msec    TR Vmax:        277.00 cm/s  MV E velocity: 89.10 cm/s  MV A velocity: 78.60 cm/s  SHUNTS  MV E/A ratio:  1.13        Systemic  VTI: 0.25 m   Antimicrobials:  Anti-infectives (From admission, onward)    None       Subjective: Seen and examined at bedside and she was still feeling SOB and orthopneic.  No nausea or vomiting. Feels ok.  No other concerns or complaints at this time but wants to go home still.  Objective: Vitals:   06/13/23 0403 06/13/23 0920 06/13/23 1123 06/13/23 1156  BP: (!) 159/63 (!) 141/79  126/77  Pulse: (!) 48 95  81  Resp: 18 18  20   Temp: 98.3 F (36.8 C) 97.8 F (36.6 C)  97.8 F (36.6 C)  TempSrc: Oral   Oral  SpO2: 90% 92% (!) 88% 94%  Weight: 64 kg     Height:        Intake/Output Summary (Last 24 hours) at 06/13/2023 1451 Last data filed at 06/13/2023 1122 Gross per 24 hour  Intake 880 ml  Output 1900 ml  Net -1020 ml   Filed Weights   06/11/23 0435 06/12/23 0334 06/13/23 0403  Weight: 65 kg 65.1 kg 64 kg   Examination: Physical Exam:  Constitutional: WN/WD obese female in no acute distress but appears a little dyspneic Respiratory: Diminished to auscultation bilaterally with some crackles. no wheezing, rales, rhonchi. Normal respiratory effort and patient is not tachypenic. No accessory muscle use.  Wearing supplemental oxygen via nasal cannula Cardiovascular: RRR, no murmurs / rubs / gallops. S1 and S2 auscultated. No extremity edema. Abdomen: Soft, non-tender, distended secondary to body habitus. Bowel sounds positive.  GU: Deferred. Musculoskeletal: No clubbing / cyanosis of digits/nails. No joint deformity upper and lower extremities.  Skin: No rashes, lesions, ulcers on a limited skin evaluation. No induration; Warm and dry.  Neurologic: CN 2-12 grossly intact with no focal deficits. Romberg sign and cerebellar reflexes not assessed.  Psychiatric: Normal judgment and insight. Alert and oriented x 3. Normal mood and appropriate affect.   Data Reviewed: I have personally reviewed following labs and imaging studies  CBC: Recent Labs  Lab 06/10/23 1629  06/11/23 0252 06/12/23 0226 06/13/23 0240  WBC 10.8* 14.1* 11.2* 12.2*  NEUTROABS  --   --  5.7 6.3  HGB 13.1 12.8 12.8 13.7  HCT 40.1 38.5 38.5 42.2  MCV 61.8* 61.7* 61.2* 62.1*  PLT 217 216 213 219   Basic Metabolic Panel: Recent Labs  Lab 06/10/23 1629 06/11/23 0244 06/11/23 0252 06/12/23 0226 06/13/23 0240  NA 140  --  139 137 139  K 3.7  --  3.1* 3.7 4.5  CL 105  --  104 104 104  CO2 24  --  26 26 25   GLUCOSE 154*  --  145* 162* 147*  BUN 12  --  15 23* 26*  CREATININE 0.53  --  0.67 0.91 0.69  CALCIUM 9.5  --  9.3 9.2 9.9  MG  --   --  1.7 1.8 2.0  PHOS  --  4.1  --  4.1 4.6   GFR: Estimated Creatinine Clearance: 57.6 mL/min (by C-G formula based on SCr of 0.69 mg/dL). Liver Function Tests: Recent Labs  Lab 06/11/23 0244 06/12/23 0226 06/13/23 0240  AST 33 21 22  ALT 29 24 23   ALKPHOS 72 66 74  BILITOT 0.8 0.6 0.8  PROT 8.1 7.1 7.6  ALBUMIN 3.4* 3.2* 3.4*   No results for input(s): "LIPASE", "AMYLASE" in the last 168 hours. No results for input(s): "AMMONIA" in the last 168 hours. Coagulation Profile: No results for input(s): "INR", "PROTIME" in the last 168 hours. Cardiac Enzymes: No results for input(s): "CKTOTAL", "CKMB", "CKMBINDEX", "TROPONINI" in the last 168 hours. BNP (last 3 results) No results for input(s): "PROBNP" in the last 8760 hours. HbA1C: Recent Labs    06/11/23 0252  HGBA1C 7.5*   CBG: Recent Labs  Lab 06/12/23 1120 06/12/23 1709 06/12/23 2059 06/13/23 0531 06/13/23 1154  GLUCAP 197* 151* 179* 217* 202*   Lipid Profile: No results for input(s): "CHOL", "HDL", "LDLCALC", "TRIG", "CHOLHDL", "LDLDIRECT" in the last 72 hours. Thyroid Function Tests: Recent Labs    06/10/23 1629  TSH <0.010*  FREET4 4.88*   Anemia Panel: No results for input(s): "VITAMINB12", "FOLATE", "FERRITIN", "TIBC", "IRON", "RETICCTPCT" in the last 72 hours. Sepsis Labs: No results for input(s): "PROCALCITON", "LATICACIDVEN" in the last 168  hours.  Recent Results (from the past 240 hour(s))  Culture, blood (Routine X 2) w Reflex to ID Panel     Status: None (Preliminary result)   Collection Time: 06/11/23  5:24 PM   Specimen: BLOOD RIGHT HAND  Result Value Ref Range Status   Specimen Description BLOOD RIGHT HAND  Final   Special Requests   Final    BOTTLES DRAWN AEROBIC AND ANAEROBIC Blood Culture results may not be optimal due to an inadequate volume of blood received in culture bottles   Culture   Final    NO GROWTH 2 DAYS Performed at [facility], [address] [place], [place] [address]    Report Status PENDING  Incomplete  Culture, blood (Routine X 2) w Reflex to ID Panel     Status: None (Preliminary result)   Collection Time: 06/11/23  5:24 PM   Specimen: BLOOD LEFT HAND  Result Value Ref Range Status   Specimen Description BLOOD LEFT HAND  Final   Special Requests   Final    BOTTLES DRAWN AEROBIC AND ANAEROBIC Blood Culture results may not be optimal due to an inadequate volume of blood received in culture bottles   Culture   Final    NO GROWTH 2 DAYS Performed at [facility], [address] [place], [place] [address]    Report Status PENDING  Incomplete    Radiology Studies: DG CHEST PORT 1 VIEW  Result Date: 06/13/2023 CLINICAL DATA:  62 year old female with history of shortness of breath. EXAM: PORTABLE CHEST 1 VIEW COMPARISON:  Chest x-ray 06/12/2023. FINDINGS: Lung volumes are low. Diffuse interstitial prominence and widespread peribronchial cuffing with some patchy ill-defined opacities scattered throughout the lungs bilaterally which likely reflect a background of very mild interstitial pulmonary edema. No confluent consolidative airspace disease. No pleural effusions. No pneumothorax. Heart size is mildly enlarged. Prominence of the central pulmonary arteries is again noted. Atherosclerotic calcifications are noted in the thoracic aorta. IMPRESSION: 1. The appearance of the chest suggests  mild congestive heart failure. 2. Prominence of the central pulmonary arteries is again noted, which may suggest underlying pulmonary arterial hypertension. Electronically Signed   By: Trudie Reed M.D.   On: 06/13/2023 07:42   DG CHEST PORT 1 VIEW  Result Date: 06/12/2023 CLINICAL DATA:  Short of breath. EXAM: PORTABLE CHEST 1 VIEW COMPARISON:  06/10/2023 and  older studies. FINDINGS: Mild enlargement the cardiac silhouette, stable. No mediastinal or hilar masses. Prominent central pulmonary vasculature. No evidence of pneumonia or pulmonary edema. No convincing pleural effusion and no pneumothorax. IMPRESSION: 1. No acute findings.  No change from the most recent prior study. 2. Mild cardiomegaly and prominent central pulmonary vasculature. Electronically Signed   By: Amie Portland M.D.   On: 06/12/2023 09:06    Scheduled Meds:  amLODipine  10 mg Oral Daily   apixaban  5 mg Oral BID   furosemide  60 mg Intravenous Q12H   insulin aspart  0-9 Units Subcutaneous TID WC   methimazole  10 mg Oral BID   propranolol  40 mg Oral BID   sodium chloride flush  3 mL Intravenous Q12H   Continuous Infusions:   LOS: 2 days   Marguerita Merles, DO [facility] Available via Epic secure chat 7am-7pm After these hours, please refer to coverage provider listed on [email or web address] 06/13/2023, 2:51 PM

## 2023-06-13 NOTE — Progress Notes (Signed)
Mobility Specialist Progress Note:    06/13/23 1148  Mobility  Activity Ambulated with assistance in hallway  Level of Assistance Standby assist, set-up cues, supervision of patient - no hands on  Assistive Device None  Distance Ambulated (ft) 250 ft  Activity Response Tolerated well  Mobility Referral Yes  $Mobility charge 1 Mobility  Mobility Specialist Start Time (ACUTE ONLY) 1135  Mobility Specialist Stop Time (ACUTE ONLY) 1149  Mobility Specialist Time Calculation (min) (ACUTE ONLY) 14 min   Received pt in bed having no complaints and agreeable to mobility. Pt was asymptomatic throughout ambulation. Pt ambulated on 2L/min, SPO2 98 %, returned to RA SPO2 was 93%. No SOB. Rreturned to room w/o fault. Left in bed w/ call bell in reach and all needs met. Left on 2L/min per RN request.   Thompson Grayer Mobility Specialist  Please contact vis Secure Chat or  Rehab Office [phone]

## 2023-06-13 NOTE — Consult Note (Addendum)
Cardiology Consultation   Patient ID: [name] MRN: [id number]; DOB: April 22, 1961  Admit date: 06/10/2023 Date of Consult: 06/13/2023  PCP:  Default, Provider, MD   [facility] Providers Cardiologist:  Olga Millers, MD   {  Patient Profile:   Shannon Simpson is a 62 y.o. 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 Marland Mcalpine.  History of Present Illness:   [name] was previously followed by Dr. Jens Som has not been seen since February 2020.  She had just had previous admission for hypertensive urgency and heart failure exacerbation in the setting of noncompliance to medications.  In December 2023 she was admitted for thyroid storm and had not been seen by endocrinology due to financial issues.  She was given IV hydrocortisone and propranolol with continuation of Tapazole.  During this admission she went to A-fib RVR and started on amiodarone and converted back to normal sinus rhythm. Discharged on beta-blocker.  Was not started on Eliquis.   Currently patient admitted for hypoxia and CHF exacerbation.  She was seen outpatient and noted to be hypoxic with oxygen saturations in the low to mid 80s.  She was started on IV Lasix 40 mg, uptitrated to 60 mg today due to to continued hypoxia with mild volume on exam.  Cardiology has been asked to further manage this.  Additionally, patient had ran out of her methimazole 1 week ago.  TSH less than 0.010.  Free T4.88.  Patient does not speak [place], her daughter is accompanied with her who provided translation.  She states that her mother has had intermittent complaints of shortness of breath and peripheral edema last couple months that she relates to with fluctuations in her thyroid dysfunction and on  whether or not she takes her medications.  Currently, patient does not have any complaints, however does not seem forthcoming with her symptoms as she feels she is ready to leave and wanted to be discharged yesterday.  However, when examining patient in the supine position she did note some orthopnea.  Still remains to be on supplemental oxygen 2 L.  At home family reports that she is somewhat sedentary however is able to ambulate independently without significant symptoms.  She denies any chest pain, palpitations, dizziness, fatigue, syncope.  She is now walking with mobility who did not note any oxygen desaturations and no symptoms of shortness of breath.  She is not on a diuretic at home.  Repeat chest x-ray today shows mild congestive heart failure with findings suggestive of pulmonary hypertension.  She has normal renal function.  BNP 260 on admission now 72.  WBC elevated 12.2 with no clear etiology.  Hemoglobin 6.8.  Normal magnesium 2.0.  Negative urine cultures.    Past Medical History:  Diagnosis Date   CHF (congestive heart failure) (HCC)    Chronic diastolic heart failure (HCC)    Class 1 obesity with body mass index (BMI) of 32.0 to 32.9 in adult    Hypertension    Hyperthyroidism 04/28/2019   Microcytic anemia    Mild pulmonary hypertension (HCC)    Paroxysmal atrial fibrillation (HCC) 06/10/2023   Respiratory failure with hypoxia (HCC) 08/2017   SIRS (systemic inflammatory response syndrome) (HCC) 08/2017   Type 2 diabetes mellitus (HCC) 06/10/2023    Past Surgical History:  Procedure Laterality Date  APPENDECTOMY       Inpatient Medications: Scheduled Meds:  amLODipine  10 mg Oral Daily   enoxaparin (LOVENOX) injection  40 mg Subcutaneous Q24H   furosemide  60 mg Intravenous Q12H   insulin aspart  0-9 Units Subcutaneous TID WC   methimazole  10 mg Oral BID   metoprolol tartrate  25 mg Oral BID   sodium chloride flush  3 mL Intravenous Q12H   Continuous  Infusions:  PRN Meds: acetaminophen **OR** acetaminophen, ondansetron **OR** ondansetron (ZOFRAN) IV, senna-docusate  Allergies:   No Known Allergies  Social History:   Social History   Socioeconomic History   Marital status: Single    Spouse name: Not on file   Number of children: 3   Years of education: Not on file   Highest education level: Not on file  Occupational History   Not on file  Tobacco Use   Smoking status: Never   Smokeless tobacco: Never  Vaping Use   Vaping status: Never Used  Substance and Sexual Activity   Alcohol use: No   Drug use: No   Sexual activity: Not on file  Other Topics Concern   Not on file  Social History Narrative   Not on file   Social Determinants of Health   Financial Resource Strain: Not on file  Food Insecurity: No Food Insecurity (06/11/2023)   Hunger Vital Sign    Worried About Running Out of Food in the Last Year: Never true    Ran Out of Food in the Last Year: Never true  Transportation Needs: No Transportation Needs (06/11/2023)   PRAPARE - Administrator, Civil Service (Medical): No    Lack of Transportation (Non-Medical): No  Physical Activity: Not on file  Stress: Not on file  Social Connections: Not on file  Intimate Partner Violence: Not At Risk (06/11/2023)   Humiliation, Afraid, Rape, and Kick questionnaire    Fear of Current or Ex-Partner: No    Emotionally Abused: No    Physically Abused: No    Sexually Abused: No    Family History:   Family History  Problem Relation Age of Onset   Heart disease Other        No family history   Diabetes Mellitus II Mother      ROS:  Please see the history of present illness. All other ROS reviewed and negative.     Physical Exam/Data:   Vitals:   06/13/23 0032 06/13/23 0403 06/13/23 0920 06/13/23 1123  BP: 135/65 (!) 159/63 (!) 141/79   Pulse: 75 (!) 48 95   Resp: 19 18 18    Temp: 98.3 F (36.8 C) 98.3 F (36.8 C) 97.8 F (36.6 C)   TempSrc: Oral  Oral    SpO2: 94% 90% 92% (!) 88%  Weight:  64 kg    Height:        Intake/Output Summary (Last 24 hours) at 06/13/2023 1207 Last data filed at 06/13/2023 1122 Gross per 24 hour  Intake 880 ml  Output 1900 ml  Net -1020 ml      06/13/2023    4:03 AM 06/12/2023    3:34 AM 06/11/2023    4:35 AM  Last 3 Weights  Weight (lbs) 141 lb 1.6 oz 143 lb 8 oz 143 lb 3.2 oz  Weight (kg) 64.003 kg 65.091 kg 64.955 kg     Body mass index is 30.53 kg/m.  General:  Well nourished, well developed, in no acute distress.  Supplemental  oxygen 2 L via nasal cannula HEENT: normal Neck: Mild JVD Vascular: No carotid bruits; Distal pulses 2+ bilaterally Cardiac:  normal S1, S2; RRR; no murmur  Lungs: Crackles left lower lobe Abd: soft, nontender, no hepatomegaly  Ext: Mild edema left leg  Musculoskeletal:  No deformities, BUE and BLE strength normal and equal Skin: warm and dry  Neuro:  CNs 2-12 intact, no focal abnormalities noted Psych:  Normal affect   EKG:  The EKG was personally reviewed and demonstrates: Normal sinus rhythm, heart rate 94.  No acute ST-T wave changes. Telemetry:  Telemetry was personally reviewed and demonstrates: Sinus arrhythmia versus PACs  Relevant CV Studies: Echocardiogram 06/11/2023  1. Left ventricular ejection fraction, by estimation, is 55 to 60%. The  left ventricle has normal function. The left ventricle has no regional  wall motion abnormalities. Left ventricular diastolic parameters were  normal.   2. Right ventricular systolic function is low normal. The right  ventricular size is normal. There is mildly elevated pulmonary artery  systolic pressure. The estimated right ventricular systolic pressure is  33.7 mmHg.   3. The mitral valve is normal in structure. Trivial mitral valve  regurgitation.   4. The aortic valve is tricuspid. There is mild thickening of the aortic  valve. Aortic valve regurgitation is trivial. Aortic valve sclerosis is  present,  with no evidence of aortic valve stenosis.   5. The inferior vena cava is normal in size with greater than 50%  respiratory variability, suggesting right atrial pressure of 3 mmHg.   Comparison(s): Prior images reviewed side by side. Compared to the  previous study, there is less mitral insufficiency and the right heart  pressures are lower.    Laboratory Data:  High Sensitivity Troponin:   Recent Labs  Lab 06/10/23 1629 06/10/23 1853  TROPONINIHS 6 7     Chemistry Recent Labs  Lab 06/11/23 0252 06/12/23 0226 06/13/23 0240  NA 139 137 139  K 3.1* 3.7 4.5  CL 104 104 104  CO2 26 26 25   GLUCOSE 145* 162* 147*  BUN 15 23* 26*  CREATININE 0.67 0.91 0.69  CALCIUM 9.3 9.2 9.9  MG 1.7 1.8 2.0  GFRNONAA >60 >60 >60  ANIONGAP 9 7 10     Recent Labs  Lab 06/11/23 0244 06/12/23 0226 06/13/23 0240  PROT 8.1 7.1 7.6  ALBUMIN 3.4* 3.2* 3.4*  AST 33 21 22  ALT 29 24 23   ALKPHOS 72 66 74  BILITOT 0.8 0.6 0.8   Lipids No results for input(s): "CHOL", "TRIG", "HDL", "LABVLDL", "LDLCALC", "CHOLHDL" in the last 168 hours.  Hematology Recent Labs  Lab 06/11/23 0252 06/12/23 0226 06/13/23 0240  WBC 14.1* 11.2* 12.2*  RBC 6.24* 6.29* 6.80*  HGB 12.8 12.8 13.7  HCT 38.5 38.5 42.2  MCV 61.7* 61.2* 62.1*  MCH 20.5* 20.3* 20.1*  MCHC 33.2 33.2 32.5  RDW 16.8* 16.9* 17.5*  PLT 216 213 219   Thyroid  Recent Labs  Lab 06/10/23 1629  TSH <0.010*  FREET4 4.88*    BNP Recent Labs  Lab 06/10/23 1629 06/12/23 1710  BNP 260.7* 72.2    DDimer No results for input(s): "DDIMER" in the last 168 hours.   Radiology/Studies:  DG CHEST PORT 1 VIEW  Result Date: 06/13/2023 CLINICAL DATA:  62 year old female with history of shortness of breath. EXAM: PORTABLE CHEST 1 VIEW COMPARISON:  Chest x-ray 06/12/2023. FINDINGS: Lung volumes are low. Diffuse interstitial prominence and widespread peribronchial cuffing with some patchy ill-defined opacities scattered  throughout the lungs  bilaterally which likely reflect a background of very mild interstitial pulmonary edema. No confluent consolidative airspace disease. No pleural effusions. No pneumothorax. Heart size is mildly enlarged. Prominence of the central pulmonary arteries is again noted. Atherosclerotic calcifications are noted in the thoracic aorta. IMPRESSION: 1. The appearance of the chest suggests mild congestive heart failure. 2. Prominence of the central pulmonary arteries is again noted, which may suggest underlying pulmonary arterial hypertension. Electronically Signed   By: Trudie Reed M.D.   On: 06/13/2023 07:42   DG CHEST PORT 1 VIEW  Result Date: 06/12/2023 CLINICAL DATA:  Short of breath. EXAM: PORTABLE CHEST 1 VIEW COMPARISON:  06/10/2023 and older studies. FINDINGS: Mild enlargement the cardiac silhouette, stable. No mediastinal or hilar masses. Prominent central pulmonary vasculature. No evidence of pneumonia or pulmonary edema. No convincing pleural effusion and no pneumothorax. IMPRESSION: 1. No acute findings.  No change from the most recent prior study. 2. Mild cardiomegaly and prominent central pulmonary vasculature. Electronically Signed   By: Amie Portland M.D.   On: 06/12/2023 09:06   ECHOCARDIOGRAM COMPLETE  Result Date: 06/11/2023    ECHOCARDIOGRAM REPORT   Patient Name:   [facility] Date of Exam: 06/11/2023 Medical Rec #:  [id number]           Height:       57.0 in Accession #:    [id number]          Weight:       143.2 lb Date of Birth:  Aug 19, 1960          BSA:          1.561 m Patient Age:    61 years            BP:           139/74 mmHg Patient Gender: F                   HR:           85 bpm. Exam Location:  Inpatient Procedure: 2D Echo, Cardiac Doppler and Color Doppler Indications:    CHF  History:        Patient has prior history of Echocardiogram examinations, most                 recent 07/02/2022. CHF, Arrythmias:Atrial Fibrillation; Risk                 Factors:Diabetes.   Sonographer:    Darlys Gales Referring Phys: [id number] VISHAL R PATEL IMPRESSIONS  1. Left ventricular ejection fraction, by estimation, is 55 to 60%. The left ventricle has normal function. The left ventricle has no regional wall motion abnormalities. Left ventricular diastolic parameters were normal.  2. Right ventricular systolic function is low normal. The right ventricular size is normal. There is mildly elevated pulmonary artery systolic pressure. The estimated right ventricular systolic pressure is 33.7 mmHg.  3. The mitral valve is normal in structure. Trivial mitral valve regurgitation.  4. The aortic valve is tricuspid. There is mild thickening of the aortic valve. Aortic valve regurgitation is trivial. Aortic valve sclerosis is present, with no evidence of aortic valve stenosis.  5. The inferior vena cava is normal in size with greater than 50% respiratory variability, suggesting right atrial pressure of 3 mmHg. Comparison(s): Prior images reviewed side by side. Compared to the previous study, there is less mitral insufficiency and the right heart pressures are lower. FINDINGS  Left Ventricle: Left ventricular  ejection fraction, by estimation, is 55 to 60%. The left ventricle has normal function. The left ventricle has no regional wall motion abnormalities. The left ventricular internal cavity size was normal in size. There is  no left ventricular hypertrophy. Left ventricular diastolic parameters were normal. Right Ventricle: The right ventricular size is normal. Right vetricular wall thickness was not well visualized. Right ventricular systolic function is low normal. There is mildly elevated pulmonary artery systolic pressure. The tricuspid regurgitant velocity is 2.77 m/s, and with an assumed right atrial pressure of 3 mmHg, the estimated right ventricular systolic pressure is 33.7 mmHg. Left Atrium: Left atrial size was normal in size. Right Atrium: Right atrial size was normal in size. Pericardium:  There is no evidence of pericardial effusion. Mitral Valve: The mitral valve is normal in structure. Trivial mitral valve regurgitation. Tricuspid Valve: The tricuspid valve is normal in structure. Tricuspid valve regurgitation is mild. Aortic Valve: The aortic valve is tricuspid. There is mild thickening of the aortic valve. Aortic valve regurgitation is trivial. Aortic regurgitation PHT measures 757 msec. Aortic valve sclerosis is present, with no evidence of aortic valve stenosis. Aortic valve mean gradient measures 7.0 mmHg. Aortic valve peak gradient measures 11.7 mmHg. Pulmonic Valve: The pulmonic valve was grossly normal. Pulmonic valve regurgitation is mild. No evidence of pulmonic stenosis. Aorta: The aortic root and ascending aorta are structurally normal, with no evidence of dilitation. Venous: The inferior vena cava is normal in size with greater than 50% respiratory variability, suggesting right atrial pressure of 3 mmHg. IAS/Shunts: The interatrial septum was not well visualized.  LEFT VENTRICLE PLAX 2D LVIDd:         4.40 cm Diastology LVIDs:         2.80 cm LV e' medial:    7.00 cm/s LV PW:         1.10 cm LV E/e' medial:  12.7 LV IVS:        0.90 cm LV e' lateral:   10.10 cm/s                        LV E/e' lateral: 8.8  RIGHT VENTRICLE RV S prime:     10.70 cm/s TAPSE (M-mode): 2.0 cm LEFT ATRIUM             Index        RIGHT ATRIUM           Index LA Vol (A2C):   62.0 ml 39.73 ml/m  RA Area:     18.40 cm LA Vol (A4C):   73.7 ml 47.23 ml/m  RA Volume:   53.40 ml  34.22 ml/m LA Biplane Vol: 71.4 ml 45.75 ml/m  AORTIC VALVE AV Vmax:           171.00 cm/s AV Vmean:          125.000 cm/s AV VTI:            0.329 m AV Peak Grad:      11.7 mmHg AV Mean Grad:      7.0 mmHg LVOT Vmax:         116.00 cm/s LVOT Vmean:        90.000 cm/s LVOT VTI:          0.246 m LVOT/AV VTI ratio: 0.75 AI PHT:            757 msec  AORTA Ao Root diam: 2.60 cm Ao Asc diam:  3.90 cm MITRAL VALVE  TRICUSPID  VALVE MV Area (PHT): 3.05 cm    TR Peak grad:   30.7 mmHg MV Decel Time: 249 msec    TR Vmax:        277.00 cm/s MV E velocity: 89.10 cm/s MV A velocity: 78.60 cm/s  SHUNTS MV E/A ratio:  1.13        Systemic VTI: 0.25 m Thurmon Fair MD Electronically signed by Thurmon Fair MD Signature Date/Time: 06/11/2023/5:18:14 PM    Final    DG Chest 2 View  Result Date: 06/10/2023 CLINICAL DATA:  62 year old female with shortness of breath. Hypoxia. EXAM: CHEST - 2 VIEW COMPARISON:  CTA chest 09/30/2022 and earlier. FINDINGS: AP and lateral views of the chest at 1700 hours. Stable cardiomegaly and mediastinal contours. Stable lung volumes. Visualized tracheal air column is within normal limits. Central pulmonary artery enlargement is again evident. Pulmonary vascularity appears stable from earlier this year. No pneumothorax, pleural effusion or confluent lung opacity. No acute osseous abnormality identified. Negative visible bowel gas. IMPRESSION: Chronic cardiomegaly and suspected pulmonary artery hypertension. No acute cardiopulmonary abnormality. Electronically Signed   By: Odessa Fleming M.D.   On: 06/10/2023 17:42     Assessment and Plan:   Acute on chronic HFpEF Presented outpatient with hypoxia that seems to be persistent and still requiring supplemental oxygen 2L.  IV Lasix 40 mg twice daily has been increased to 60 mg today.  Echocardiogram shows preserved EF 55 to 60% with low normal RV function.  Mildly elevated PASP, 33.7.  She still appears to be mildly volume up and orthopneic.  However, ambulating in the halls maintaining normal saturations without significant symptoms. Continue IV Lasix 60 mg twice daily, wean oxygen as tolerated.  She has mild pulmonary hypertension, family reports that she snores but no daytime somnolence.  May consider outpatient sleep study for OSA. Not a smoker.  Atrial fibrillation in the setting of thyroid storm Previous admission in December 2023.  Maintaining sinus  rhythm here.  Was not initiated on Eliquis.  Defer to MD whether this is indicated now, or if this needs further management (monitor). Continue Lopressor 25 mg twice daily, may consider propanolol with hx of thyroid disease.  Discussed with MD, will start Eliquis 5mg .   Hyperthyroidism  Has been out of her methimazole for 1 week.  Likely exacerbating her heart failure.  She is on methimazole 10 mg twice daily.  TSH less than 0.01.  T4 4.80.  Has outpatient endocrinology.  Hypertension Decently controlled on amlodipine 10 mg, Lopressor as above.   Risk Assessment/Risk Scores:   New York Heart Association (NYHA) Functional Class NYHA Class III  CHA2DS2-VASc Score = 4  This indicates a 4.8% annual risk of stroke. The patient's score is based upon: CHF History: 1 HTN History: 1 Diabetes History: 1 Stroke History: 0 Vascular Disease History: 0 Age Score: 0 Gender Score: 1    For questions or updates, please contact [facility] Please consult [email or web address] for contact info under   Signed, Abagail Kitchens, PA-C  06/13/2023 12:07 PM   Patient seen and examined, note reviewed with the signed Advanced Practice Provider. I personally reviewed laboratory data, imaging studies and relevant notes. I independently examined the patient and formulated the important aspects of the plan. I have personally discussed the plan with the patient and/or family. Comments or changes to the note/plan are indicated below.  Seen in her room, tired to get [facility] interpreter phone services patient declines prefers for daughter to interpret.  Based on my clinical assessment she will benefit from one more day of IV diuretics.   For the PAF - in sinus rhythm, but on lopressor, per daughter she did well on propanolol will transition her back to propranolol. She should be anticoagulated - CHADSVASc score is 4. Please start Eliquis 5 mg BID for stroke prevention.   She is also Hypertensive - on  Amlodipine. I am holding off in optimizing this dose because I will start her propranolol now for the 40 mg BID.     Thomasene Ripple DO, MS [facility] Attending Cardiologist [facility]  [address] [place], [place] [address] [phone] Website: [email or web address]

## 2023-06-13 NOTE — Plan of Care (Signed)
  Problem: Coping: Goal: Ability to adjust to condition or change in health will improve Outcome: Progressing   Problem: Fluid Volume: Goal: Ability to maintain a balanced intake and output will improve Outcome: Progressing

## 2023-06-14 ENCOUNTER — Other Ambulatory Visit (HOSPITAL_COMMUNITY): Payer: Self-pay

## 2023-06-14 ENCOUNTER — Inpatient Hospital Stay (HOSPITAL_COMMUNITY): Payer: Medicaid Other

## 2023-06-14 DIAGNOSIS — J9601 Acute respiratory failure with hypoxia: Secondary | ICD-10-CM | POA: Diagnosis not present

## 2023-06-14 DIAGNOSIS — I480 Paroxysmal atrial fibrillation: Secondary | ICD-10-CM | POA: Diagnosis not present

## 2023-06-14 DIAGNOSIS — I5033 Acute on chronic diastolic (congestive) heart failure: Secondary | ICD-10-CM | POA: Diagnosis not present

## 2023-06-14 DIAGNOSIS — E0590 Thyrotoxicosis, unspecified without thyrotoxic crisis or storm: Secondary | ICD-10-CM | POA: Diagnosis not present

## 2023-06-14 LAB — COMPREHENSIVE METABOLIC PANEL
ALT: 23 U/L (ref 0–44)
AST: 21 U/L (ref 15–41)
Albumin: 3.5 g/dL (ref 3.5–5.0)
Alkaline Phosphatase: 70 U/L (ref 38–126)
Anion gap: 8 (ref 5–15)
BUN: 35 mg/dL — ABNORMAL HIGH (ref 6–20)
CO2: 28 mmol/L (ref 22–32)
Calcium: 9.8 mg/dL (ref 8.9–10.3)
Chloride: 102 mmol/L (ref 98–111)
Creatinine, Ser: 0.77 mg/dL (ref 0.44–1.00)
GFR, Estimated: >60 mL/min (ref >60)
Glucose, Bld: 196 mg/dL — ABNORMAL HIGH (ref 70–99)
Potassium: 4.3 mmol/L (ref 3.5–5.1)
Sodium: 138 mmol/L (ref 135–145)
Total Bilirubin: 0.6 mg/dL (ref <1.2)
Total Protein: 7.7 g/dL (ref 6.5–8.1)

## 2023-06-14 LAB — CBC WITH DIFFERENTIAL/PLATELET
Abs Immature Granulocytes: 0.02 10*3/uL (ref 0.00–0.07)
Basophils Absolute: 0.1 10*3/uL (ref 0.0–0.1)
Basophils Relative: 1 %
Eosinophils Absolute: 0.5 10*3/uL (ref 0.0–0.5)
Eosinophils Relative: 4 %
HCT: 41.2 % (ref 36.0–46.0)
Hemoglobin: 13.5 g/dL (ref 12.0–15.0)
Immature Granulocytes: 0 %
Lymphocytes Relative: 32 %
Lymphs Abs: 3.7 10*3/uL (ref 0.7–4.0)
MCH: 20.2 pg — ABNORMAL LOW (ref 26.0–34.0)
MCHC: 32.8 g/dL (ref 30.0–36.0)
MCV: 61.8 fL — ABNORMAL LOW (ref 80.0–100.0)
Monocytes Absolute: 1.5 10*3/uL — ABNORMAL HIGH (ref 0.1–1.0)
Monocytes Relative: 13 %
Neutro Abs: 5.9 10*3/uL (ref 1.7–7.7)
Neutrophils Relative %: 50 %
Platelets: 228 10*3/uL (ref 150–400)
RBC: 6.67 MIL/uL — ABNORMAL HIGH (ref 3.87–5.11)
RDW: 17.1 % — ABNORMAL HIGH (ref 11.5–15.5)
WBC: 11.6 10*3/uL — ABNORMAL HIGH (ref 4.0–10.5)
nRBC: 0 % (ref 0.0–0.2)

## 2023-06-14 LAB — MAGNESIUM: Magnesium: 1.9 mg/dL (ref 1.7–2.4)

## 2023-06-14 LAB — GLUCOSE, CAPILLARY
Glucose-Capillary: 207 mg/dL — ABNORMAL HIGH (ref 70–99)
Glucose-Capillary: 234 mg/dL — ABNORMAL HIGH (ref 70–99)

## 2023-06-14 LAB — PHOSPHORUS: Phosphorus: 4.5 mg/dL (ref 2.5–4.6)

## 2023-06-14 MED ORDER — ONDANSETRON HCL 4 MG PO TABS
4.0000 mg | ORAL_TABLET | Freq: Four times a day (QID) | ORAL | 0 refills | Status: AC | PRN
  Filled 2023-06-14: qty 20, 5d supply, fill #0

## 2023-06-14 MED ORDER — SENNOSIDES-DOCUSATE SODIUM 8.6-50 MG PO TABS
1.0000 | ORAL_TABLET | Freq: Every evening | ORAL | 0 refills | Status: AC | PRN
  Filled 2023-06-14: qty 100, 30d supply, fill #0

## 2023-06-14 MED ORDER — DAPAGLIFLOZIN PROPANEDIOL 10 MG PO TABS
10.0000 mg | ORAL_TABLET | Freq: Every day | ORAL | Status: DC
  Administered 2023-06-14: 10 mg via ORAL
  Filled 2023-06-14: qty 1

## 2023-06-14 MED ORDER — PROPRANOLOL HCL 40 MG PO TABS
40.0000 mg | ORAL_TABLET | Freq: Two times a day (BID) | ORAL | 0 refills | Status: AC
  Filled 2023-06-14: qty 60, 30d supply, fill #0

## 2023-06-14 MED ORDER — DAPAGLIFLOZIN PROPANEDIOL 10 MG PO TABS
10.0000 mg | ORAL_TABLET | Freq: Every day | ORAL | 0 refills | Status: AC
  Filled 2023-06-14: qty 30, 30d supply, fill #0

## 2023-06-14 MED ORDER — SPIRONOLACTONE 25 MG PO TABS
25.0000 mg | ORAL_TABLET | Freq: Every day | ORAL | Status: DC
  Administered 2023-06-14: 25 mg via ORAL
  Filled 2023-06-14: qty 1

## 2023-06-14 MED ORDER — SPIRONOLACTONE 25 MG PO TABS
25.0000 mg | ORAL_TABLET | Freq: Every day | ORAL | 0 refills | Status: AC
  Filled 2023-06-14: qty 30, 30d supply, fill #0

## 2023-06-14 MED ORDER — APIXABAN 5 MG PO TABS
5.0000 mg | ORAL_TABLET | Freq: Two times a day (BID) | ORAL | 0 refills | Status: AC
  Filled 2023-06-14: qty 60, 30d supply, fill #0

## 2023-06-14 MED ORDER — FUROSEMIDE 40 MG PO TABS
40.0000 mg | ORAL_TABLET | Freq: Every day | ORAL | Status: DC
  Administered 2023-06-14: 40 mg via ORAL
  Filled 2023-06-14: qty 1

## 2023-06-14 MED ORDER — FUROSEMIDE 40 MG PO TABS
40.0000 mg | ORAL_TABLET | Freq: Every day | ORAL | 0 refills | Status: AC
  Filled 2023-06-14: qty 30, 30d supply, fill #0

## 2023-06-14 MED ORDER — METHIMAZOLE 10 MG PO TABS
10.0000 mg | ORAL_TABLET | Freq: Two times a day (BID) | ORAL | 0 refills | Status: AC
  Filled 2023-06-14: qty 60, 30d supply, fill #0

## 2023-06-14 MED ORDER — ACETAMINOPHEN 325 MG PO TABS
650.0000 mg | ORAL_TABLET | Freq: Four times a day (QID) | ORAL | 0 refills | Status: AC | PRN
  Filled 2023-06-14: qty 20, 3d supply, fill #0

## 2023-06-14 NOTE — Progress Notes (Signed)
SATURATION QUALIFICATIONS: (This note is used to comply with regulatory documentation for home oxygen)  Patient Saturations on Room Air at Rest = 92% At the start of the walk then pt's saturations dropped during the walk, and at the middle of the walk pt's saturations dropped to 86.  Once pt was on  2 L, O2 her sats slowly went back to 94 once she got back to her room and when went back sitting on her bed went to 96%. Pt will need O2 while she ambulates.

## 2023-06-14 NOTE — Plan of Care (Signed)
  Problem: Clinical Measurements: Goal: Will remain free from infection Outcome: Completed/Met Goal: Respiratory complications will improve Outcome: Completed/Met   Problem: Nutrition: Goal: Adequate nutrition will be maintained Outcome: Completed/Met   Problem: Coping: Goal: Level of anxiety will decrease Outcome: Completed/Met

## 2023-06-14 NOTE — Progress Notes (Signed)
Cardiology Progress Note  Patient ID: Shannon Simpson MRN: [id number] DOB: [birth year] Date of Encounter: 06/14/2023 Primary Cardiologist: Olga Millers, MD  Subjective   Chief Complaint: None.   HPI:  euvolemic on exam.  No need for further IV diuresis.  ROS:  All other ROS reviewed and negative. Pertinent positives noted in the HPI.     Telemetry  Overnight telemetry shows sinus rhythm 70s, which I personally reviewed.    Physical Exam   Vitals:   06/13/23 1600 06/13/23 2005 06/14/23 0106 06/14/23 0640  BP: 137/78 139/63 (!) 140/71 (!) 142/73  Pulse:  74 70 80  Resp:    19  Temp:  98 F (36.7 C) 97.9 F (36.6 C) 97.6 F (36.4 C)  TempSrc:  Oral Oral Oral  SpO2: 94% 90% 96% (!) 86%  Weight:    63.8 kg  Height:        Intake/Output Summary (Last 24 hours) at 06/14/2023 0837 Last data filed at 06/13/2023 1603 Gross per 24 hour  Intake 200 ml  Output 300 ml  Net -100 ml       06/14/2023    6:40 AM 06/13/2023    4:03 AM 06/12/2023    3:34 AM  Last 3 Weights  Weight (lbs) 140 lb 10.5 oz 141 lb 1.6 oz 143 lb 8 oz  Weight (kg) 63.8 kg 64.003 kg 65.091 kg    Body mass index is 30.44 kg/m.  General: Well nourished, well developed, in no acute distress Head: Atraumatic, normal size  Eyes: PEERLA, EOMI  Neck: Supple, no JVD Endocrine: No thryomegaly Cardiac: Normal S1, S2; RRR; no murmurs, rubs, or gallops Lungs: Clear to auscultation bilaterally, no wheezing, rhonchi or rales  Abd: Soft, nontender, no hepatomegaly  Ext: No edema, pulses 2+ Musculoskeletal: No deformities, BUE and BLE strength normal and equal Skin: Warm and dry, no rashes   Neuro: Alert and oriented to person, place, time, and situation, CNII-XII grossly intact, no focal deficits  Psych: Normal mood and affect   Cardiac Studies  TTE 06/11/2023  1. Left ventricular ejection fraction, by estimation, is 55 to 60%. The  left ventricle has normal function. The left ventricle has no  regional  wall motion abnormalities. Left ventricular diastolic parameters were  normal.   2. Right ventricular systolic function is low normal. The right  ventricular size is normal. There is mildly elevated pulmonary artery  systolic pressure. The estimated right ventricular systolic pressure is  33.7 mmHg.   3. The mitral valve is normal in structure. Trivial mitral valve  regurgitation.   4. The aortic valve is tricuspid. There is mild thickening of the aortic  valve. Aortic valve regurgitation is trivial. Aortic valve sclerosis is  present, with no evidence of aortic valve stenosis.   5. The inferior vena cava is normal in size with greater than 50%  respiratory variability, suggesting right atrial pressure of 3 mmHg.   Patient Profile  Shannon Simpson is a 62 y.o. female with HFpEF, paroxysmal atrial fibrillation, hyperthyroidism, diabetes admitted on 06/10/2023 for acute on chronic diastolic heart failure.  Assessment & Plan   # Acute on chronic diastolic heart failure, EF 55-60% -Euvolemic on exam.  No further IV diuresis.  Transition to 40 mg of p.o. Lasix daily. -Add Aldactone 25 mg daily. -Add Farxiga 10 mg daily. -We will arrange outpatient follow-up.  # Paroxysmal A-fib -No recurrence.  Seems to have been driven by hyperthyroidism.  Now on methimazole.  No signs of A-fib.  Continue  propranolol 40 mg twice daily. -Eliquis added.  Continue this at discharge.  [facility] will sign off.   Medication Recommendations: As above Other recommendations (labs, testing, etc): None Follow up as an outpatient: We will arrange outpatient follow-up with Dr. Jens Som in 2 to 3 weeks  For questions or updates, please contact [facility] Please consult [email or web address] for contact info under   Signed, Gerri Spore T. Flora Lipps, MD, [facility]   [facility]  06/14/2023 8:37 AM

## 2023-06-14 NOTE — TOC Transition Note (Signed)
Transition of Care [facility]) - CM/SW Discharge Note   Patient Details  Name: Shannon Simpson MRN: [id number] Date of Birth: [birth year]  [facility] CM/SW Contact:  Lawerance Sabal, RN Phone Number: 06/14/2023, 1:47 PM   Clinical Narrative:     Home oxygen ordered through [employer].  They will deliver to the room      Barriers to Discharge: Continued Medical Work up   Patient Goals and CMS Choice      Discharge Placement                         Discharge Plan and Services Additional resources added to the After Visit Summary for   In-house Referral: Clinical Social Work              DME Arranged: Oxygen DME Agency: [employer] Date DME Agency Contacted: 06/14/23 Time DME Agency Contacted: [id number] Representative spoke with at DME Agency: Vaughan Basta            Social Determinants of Health (SDOH) Interventions SDOH Screenings   Food Insecurity: No Food Insecurity (06/11/2023)  Housing: Low Risk  (06/11/2023)  Transportation Needs: No Transportation Needs (06/11/2023)  Utilities: Not At Risk (06/11/2023)  Tobacco Use: Low Risk  (06/10/2023)     Readmission Risk Interventions     No data to display

## 2023-06-14 NOTE — Plan of Care (Signed)
  Problem: Coping: Goal: Ability to adjust to condition or change in health will improve Outcome: Progressing   Problem: Fluid Volume: Goal: Ability to maintain a balanced intake and output will improve Outcome: Progressing

## 2023-06-14 NOTE — Discharge Summary (Signed)
Physician Discharge Summary   Patient: Shannon Simpson MRN: [id number] DOB: 10/03/1960  Admit date:     06/10/2023  Discharge date: 06/14/23  Discharge Physician: Marguerita Merles, DO   PCP: Default, Provider, MD   Recommendations at discharge:   Follow-up with PCP within 1 to 2 weeks repeat CBC, CMP, mag, Phos within 1 week Repeat chest x-ray in 3 to 6 weeks Follow-up with cardiology in outpatient setting and continue with anticoagulation and medications recommended by cardiology Follow-up with endocrinology in outpatient setting Follow-up and establish with hematology for outpatient evaluation of chronic leukocytosis  Discharge Diagnoses: Principal Problem:   Acute respiratory failure with hypoxia (HCC) Active Problems:   Acute on chronic heart failure with preserved ejection fraction (HFpEF) (HCC)   Hyperthyroidism   Paroxysmal atrial fibrillation (HCC)   Type 2 diabetes mellitus (HCC)   Hypertension associated with diabetes (HCC)  Resolved Problems:   * No resolved hospital problems. *  Hospital Course: Shannon Simpson is a 62 y.o. female with medical history significant for chronic HFpEF (EF 55-60%), pulm HTN, PAF not on anticoagulation, hyperthyroidism, T2DM, HTN who is admitted with shortness of breath and found to have acute hypoxic respiratory failure due to acute on chronic HFpEF.    Currently she has been initiated on diuresis with IV Lasix 40 mg twice daily but will go up to 60 mg q12h. Of note she also has a leukocytosis of unclear etiology.  Have asked the nursing staff to wean her oxygen requirements that she is on 3 L of supplemental oxygen via nasal cannula but she desaturates on Room Air and will need continued Diruesis.  Given her slow improvement and continued volume overload with orthopnea cardiology's been consulted for further evaluation recommendations and they are recommending continue IV diuresis with Lasix 60 mg twice daily for now and wean down her  oxygen as tolerated.  Cardiology feels that she is euvolemic now and has transitioned her off of IV diuretics to oral Lasix and added spironolactone 25 mg daily and Farxiga 10 mg p.o. daily.  She is also been changed to propranolol 40 g p.o. twice daily and anticoagulation has been initiated.  She was walked and desaturated and will need 2 L supplemental oxygen and will need to follow-up with PCP, cardiology, endocrinology, and recommend outpatient pulmonary referral.  She will also need to see oncology in outpatient setting given her chronically elevated leukocytosis since last December.  Assessment and Plan:  Acute Respiratory Failure with Hypoxia in the setting of Acute on Chronic HFpEF and associated Pulmonary Artery Hypertension: -Patient with new hypoxia with SpO2 down to 89% while at rest and reportedly desaturated to 82% with ambulation.  -Suspect secondary to acute on chronic HFpEF with likely PAH contributing.   -Has trace lower extremity edema and mildly elevated BNP at 260.7 and is now 72.2 SpO2: 98 % O2 Flow Rate (L/min): 2 L/min -Continued IV Lasix 60 mg q12h and now cardiology recommending oral Lasix 40 mg daily for discharge and also adding spironolactone 25 mg p.o. daily and Farxiga 10 mg p.o. daily -Update ECHOCARDIOGRAM as below  -Metoprolol Tartrate  25 mg twice daily now being changed to propranolol 40 mg p.o. twice daily given cardiology recommendations -Continue supplemental oxygen via nasal cannula and wean O2 as tolerated to room air -Continuous pulse oximetry maintain O2 saturation greater than 90% -Patient will need an ambulatory home O2 screen prior to discharge and repeat chest x-ray in the a.m. -Strict I/O's and daily weights  Intake/Output Summary (  Last 24 hours) at 06/14/2023 1709 Last data filed at 06/14/2023 1400 Gross per 24 hour  Intake 320 ml  Output 775 ml  Net -455 ml   -PT/OT To evaluate and Treat  -Given her slow improvement cardiology has been  consulted for further evaluation and recommending continue diuresis today -Repeat ambulatory home O2 screen AM done and patient desaturated so we will discharge her on 2 L supplemental oxygen given that she is now euvolemic -Chest x-ray done and showed "Cardiomegaly with prominent central veins but no overt edema. Dilatation of the central pulmonary arteries chronically. Aortic atherosclerosis. Old granulomatous disease." -Follow-up with cardiology in outpatient setting and repeat chest x-ray within 3 to 6 weeks -Will need to see PCP and recommend having a pulmonary evaluation in outpatient setting   Hyperthyroidism: -Patient ran out of methimazole 1 week ago.  TSH <0.010, free T4 is 4.88. -Resume Methimazole 10 mg twice daily -Metoprolol Tartrate 25 mg po BID changed to propranolol 40 mg p.o. twice daily -Patient has Endocrinology follow-up scheduled on 08/19/2022   Paroxysmal Atrial Fibrillation: -Currently Sinus rhythm with occasional PACs on admission.  -She is not on anticoagulation at home for unclear reasons.   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 twice daily given the patient's CHA2DS2-VASc score was 4; will continue anticoagulant for discharge -Continue to Monitor on Cardiac Telemetry.   Type 2 Diabetes Mellitus  -Hold Janumet while hospitalized and resume at discharge and now cardiology is adding Farxiga 10 mg p.o. daily -Now HbA1c is 7.5; Placed on Sensitive Novolog SSI AC while hospitalized -CBG Trend: Recent Labs  Lab 06/12/23 2059 06/13/23 0531 06/13/23 1154 06/13/23 1615 06/13/23 2114 06/14/23 0535 06/14/23 1055  GLUCAP 179* 217* 202* 210* 184* 207* 234*  -Follow up with Endocrinology in the outpatient setting    Hypertension: -Continue Metoprolol Tartrate 25  mg p.o. twice daily and Amlodipine 10 mg po Daily  -Currently getting diuresed -Continue to Monitor BP per Protocol -Last BP reading was 140/94  Leukocytosis, unclear etiology -Unclear etiology but likely reactive has been elevated since December 2023.  Has not been normal since December of last year with current WBC Trend: Recent Labs  Lab 06/10/23 1629 06/11/23 0252 06/12/23 0226 06/13/23 0240 06/14/23 0232  WBC 10.8* 14.1* 11.2* 12.2* 11.6*  -Continue to Monitor for S/Sx of Infection and will check blood cultures x 2 and urinalysis -UA negative for Infection and Blood Cx x2 showing NGTD at <24 hours -CXR this AM done and showed "No acute findings.  No change from the most recent prior study. Mild cardiomegaly and prominent central pulmonary vasculature." -Repeat CBC in the a.m. and monitor WBC trend and fever curve -Will need outpatient Hematology Evaluation and further workup  Hypokalemia -Patient's K+ Level Trend: Recent Labs  Lab 06/10/23 1629 06/11/23 0252 06/12/23 0226 06/13/23 0240 06/14/23 0232  K 3.7 3.1* 3.7 4.5 4.3  -Continue to Monitor and Replete as Necessary -Repeat CMP in the AM   Hypoalbuminemia -Patient's Albumin Trend: Recent Labs  Lab 06/11/23 0244 06/12/23 0226 06/13/23 0240 06/14/23 0232  ALBUMIN 3.4* 3.2* 3.4* 3.5  -Continue to Monitor and Trend and repeat CMP in the AM  Obesity -Complicates overall prognosis and care -Estimated body mass index is 30.44 kg/m as calculated from the following:   Height as of this encounter:  4\' 9"  (1.448 m).   Weight as of this encounter: 63.8 kg.  -Weight Loss and Dietary Counseling given  Consultants: Cardiology  Procedures performed: ECHOCARDIOGRAM  Disposition: Home Diet recommendation:  Cardiac and Carb modified diet DISCHARGE MEDICATION: Allergies as of 06/14/2023   No Known Allergies      Medication List     STOP taking these medications    metoprolol tartrate 25 MG tablet Commonly  known as: LOPRESSOR       TAKE these medications    acetaminophen 325 MG tablet Commonly known as: TYLENOL Take 2 tablets (650 mg total) by mouth every 6 (six) hours as needed for mild pain (pain score 1-3) (or Fever >/= 101).   amLODipine 5 MG tablet Commonly known as: NORVASC Take 1 tablet (5 mg total) by mouth daily.   Eliquis 5 MG Tabs tablet Generic drug: apixaban Take 1 tablet (5 mg total) by mouth 2 (two) times daily.   Farxiga 10 MG Tabs tablet Generic drug: dapagliflozin propanediol Take 1 tablet (10 mg total) by mouth daily. Start taking on: June 15, 2023   furosemide 40 MG tablet Commonly known as: LASIX Take 1 tablet (40 mg total) by mouth daily. Start taking on: June 15, 2023   Janumet 50-500 MG tablet Generic drug: sitaGLIPtin-metformin Take 1 tablet by mouth 2 (two) times daily with a meal.   methimazole 10 MG tablet Commonly known as: TAPAZOLE Take 1 tablet (10 mg total) by mouth 2 (two) times daily.   ondansetron 4 MG tablet Commonly known as: ZOFRAN Take 1 tablet (4 mg total) by mouth every 6 (six) hours as needed for nausea.   propranolol 40 MG tablet Commonly known as: INDERAL Take 1 tablet (40 mg total) by mouth 2 (two) times daily.   Senna-S 8.6-50 MG tablet Generic drug: senna-docusate Take 1 tablet by mouth at bedtime as needed for mild constipation.   spironolactone 25 MG tablet Commonly known as: ALDACTONE Take 1 tablet (25 mg total) by mouth daily. Start taking on: June 15, 2023               Durable Medical Equipment  (From admission, onward)           Start     Ordered   06/14/23 1348  For home use only DME oxygen  Once       Question Answer Comment  Length of Need 6 Months   Mode or (Route) Nasal cannula   Liters per Minute 2   Frequency Continuous (stationary and portable oxygen unit needed)   Oxygen delivery system Gas      06/14/23 1347            Follow-up Information     Jodelle Gross, NP Follow up on 06/30/2023.   Specialties: Cardiology, Radiology, Cardiology Why: 8:50AM. Post hospital cardiology follow up Contact information: [address] [place] [address] [phone]                Discharge Exam: Ceasar Mons Weights   06/12/23 0334 06/13/23 0403 06/14/23 0640  Weight: 65.1 kg 64 kg 63.8 kg   Vitals:   06/14/23 1337 06/14/23 1407  BP:    Pulse:    Resp:    Temp:    SpO2: 92% 98%   Examination: Physical Exam:  Constitutional: WN/WD obese evaluation female in no acute distress still difficult to wean off of supplemental oxygen so we will need to be discharged on 2 L Respiratory: Diminished  to auscultation bilaterally with some coarse breath sounds and has some slight crackles but no appreciable wheezing, rales or rhonchi. Normal respiratory effort and patient is not tachypenic. No accessory muscle use.  Wearing supplemental oxygen via nasal cannula 2 L Cardiovascular: RRR, no murmurs / rubs / gallops. S1 and S2 auscultated. No extremity edema.  Abdomen: Soft, non-tender, distended secondary to body habitus.  Bowel sounds positive.  GU: Deferred. Musculoskeletal: No clubbing / cyanosis of digits/nails. No joint deformity upper and lower extremities.  Skin: No rashes, lesions, ulcers on limited skin evaluation. No induration; Warm and dry.  Neurologic: CN 2-12 grossly intact with no focal deficits. Sensation intact in all 4 Extremities, DTR normal. Strength 5/5 in all 4. Romberg sign cerebellar reflexes not assessed.  Psychiatric: Normal judgment and insight. Alert and oriented x 3. Normal mood and appropriate affect.   Condition at discharge: stable  The results of significant diagnostics from this hospitalization (including imaging, microbiology, ancillary and laboratory) are listed below for reference.   Imaging Studies: DG CHEST PORT 1 VIEW  Result Date: 06/14/2023 CLINICAL DATA:  [id number] with shortness of breath. EXAM:  PORTABLE CHEST 1 VIEW COMPARISON:  Portable chest yesterday at 5:12 a.m. FINDINGS: 6:29 a.m. Dilatation of the central pulmonary arteries again noted. The heart is enlarged. Central venous structures also prominent without overt edema. There are scattered calcified granulomas both lungs. No focal pneumonia is evident. No pleural effusion is seen. Stable mediastinum with aortic atherosclerosis. Slight thoracic dextroscoliosis with osteopenia and spondylosis. No new osseous finding. IMPRESSION: 1. Cardiomegaly with prominent central veins but no overt edema. 2. Dilatation of the central pulmonary arteries chronically. 3. Aortic atherosclerosis. 4. Old granulomatous disease. Electronically Signed   By: Almira Bar M.D.   On: 06/14/2023 07:59   DG CHEST PORT 1 VIEW  Result Date: 06/13/2023 CLINICAL DATA:  62 year old female with history of shortness of breath. EXAM: PORTABLE CHEST 1 VIEW COMPARISON:  Chest x-ray 06/12/2023. FINDINGS: Lung volumes are low. Diffuse interstitial prominence and widespread peribronchial cuffing with some patchy ill-defined opacities scattered throughout the lungs bilaterally which likely reflect a background of very mild interstitial pulmonary edema. No confluent consolidative airspace disease. No pleural effusions. No pneumothorax. Heart size is mildly enlarged. Prominence of the central pulmonary arteries is again noted. Atherosclerotic calcifications are noted in the thoracic aorta. IMPRESSION: 1. The appearance of the chest suggests mild congestive heart failure. 2. Prominence of the central pulmonary arteries is again noted, which may suggest underlying pulmonary arterial hypertension. Electronically Signed   By: Trudie Reed M.D.   On: 06/13/2023 07:42   DG CHEST PORT 1 VIEW  Result Date: 06/12/2023 CLINICAL DATA:  Short of breath. EXAM: PORTABLE CHEST 1 VIEW COMPARISON:  06/10/2023 and older studies. FINDINGS: Mild enlargement the cardiac silhouette, stable. No  mediastinal or hilar masses. Prominent central pulmonary vasculature. No evidence of pneumonia or pulmonary edema. No convincing pleural effusion and no pneumothorax. IMPRESSION: 1. No acute findings.  No change from the most recent prior study. 2. Mild cardiomegaly and prominent central pulmonary vasculature. Electronically Signed   By: Amie Portland M.D.   On: 06/12/2023 09:06   ECHOCARDIOGRAM COMPLETE  Result Date: 06/11/2023    ECHOCARDIOGRAM REPORT   Patient Name:   [facility] Date of Exam: 06/11/2023 Medical Rec #:  [id number]           Height:       57.0 in Accession #:    [id number]  Weight:       143.2 lb Date of Birth:  [birth year]          BSA:          1.561 m Patient Age:    61 years            BP:           139/74 mmHg Patient Gender: F                   HR:           85 bpm. Exam Location:  Inpatient Procedure: 2D Echo, Cardiac Doppler and Color Doppler Indications:    CHF  History:        Patient has prior history of Echocardiogram examinations, most                 recent 07/02/2022. CHF, Arrythmias:Atrial Fibrillation; Risk                 Factors:Diabetes.  Sonographer:    Darlys Gales Referring Phys: [id number] VISHAL R PATEL IMPRESSIONS  1. Left ventricular ejection fraction, by estimation, is 55 to 60%. The left ventricle has normal function. The left ventricle has no regional wall motion abnormalities. Left ventricular diastolic parameters were normal.  2. Right ventricular systolic function is low normal. The right ventricular size is normal. There is mildly elevated pulmonary artery systolic pressure. The estimated right ventricular systolic pressure is 33.7 mmHg.  3. The mitral valve is normal in structure. Trivial mitral valve regurgitation.  4. The aortic valve is tricuspid. There is mild thickening of the aortic valve. Aortic valve regurgitation is trivial. Aortic valve sclerosis is present, with no evidence of aortic valve stenosis.  5. The inferior vena cava is normal  in size with greater than 50% respiratory variability, suggesting right atrial pressure of 3 mmHg. Comparison(s): Prior images reviewed side by side. Compared to the previous study, there is less mitral insufficiency and the right heart pressures are lower. FINDINGS  Left Ventricle: Left ventricular ejection fraction, by estimation, is 55 to 60%. The left ventricle has normal function. The left ventricle has no regional wall motion abnormalities. The left ventricular internal cavity size was normal in size. There is  no left ventricular hypertrophy. Left ventricular diastolic parameters were normal. Right Ventricle: The right ventricular size is normal. Right vetricular wall thickness was not well visualized. Right ventricular systolic function is low normal. There is mildly elevated pulmonary artery systolic pressure. The tricuspid regurgitant velocity is 2.77 m/s, and with an assumed right atrial pressure of 3 mmHg, the estimated right ventricular systolic pressure is 33.7 mmHg. Left Atrium: Left atrial size was normal in size. Right Atrium: Right atrial size was normal in size. Pericardium: There is no evidence of pericardial effusion. Mitral Valve: The mitral valve is normal in structure. Trivial mitral valve regurgitation. Tricuspid Valve: The tricuspid valve is normal in structure. Tricuspid valve regurgitation is mild. Aortic Valve: The aortic valve is tricuspid. There is mild thickening of the aortic valve. Aortic valve regurgitation is trivial. Aortic regurgitation PHT measures 757 msec. Aortic valve sclerosis is present, with no evidence of aortic valve stenosis. Aortic valve mean gradient measures 7.0 mmHg. Aortic valve peak gradient measures 11.7 mmHg. Pulmonic Valve: The pulmonic valve was grossly normal. Pulmonic valve regurgitation is mild. No evidence of pulmonic stenosis. Aorta: The aortic root and ascending aorta are structurally normal, with no evidence of dilitation. Venous: The inferior vena  cava is normal  in size with greater than 50% respiratory variability, suggesting right atrial pressure of 3 mmHg. IAS/Shunts: The interatrial septum was not well visualized.  LEFT VENTRICLE PLAX 2D LVIDd:         4.40 cm Diastology LVIDs:         2.80 cm LV e' medial:    7.00 cm/s LV PW:         1.10 cm LV E/e' medial:  12.7 LV IVS:        0.90 cm LV e' lateral:   10.10 cm/s                        LV E/e' lateral: 8.8  RIGHT VENTRICLE RV S prime:     10.70 cm/s TAPSE (M-mode): 2.0 cm LEFT ATRIUM             Index        RIGHT ATRIUM           Index LA Vol (A2C):   62.0 ml 39.73 ml/m  RA Area:     18.40 cm LA Vol (A4C):   73.7 ml 47.23 ml/m  RA Volume:   53.40 ml  34.22 ml/m LA Biplane Vol: 71.4 ml 45.75 ml/m  AORTIC VALVE AV Vmax:           171.00 cm/s AV Vmean:          125.000 cm/s AV VTI:            0.329 m AV Peak Grad:      11.7 mmHg AV Mean Grad:      7.0 mmHg LVOT Vmax:         116.00 cm/s LVOT Vmean:        90.000 cm/s LVOT VTI:          0.246 m LVOT/AV VTI ratio: 0.75 AI PHT:            757 msec  AORTA Ao Root diam: 2.60 cm Ao Asc diam:  3.90 cm MITRAL VALVE               TRICUSPID VALVE MV Area (PHT): 3.05 cm    TR Peak grad:   30.7 mmHg MV Decel Time: 249 msec    TR Vmax:        277.00 cm/s MV E velocity: 89.10 cm/s MV A velocity: 78.60 cm/s  SHUNTS MV E/A ratio:  1.13        Systemic VTI: 0.25 m Rachelle Hora Croitoru MD Electronically signed by Thurmon Fair MD Signature Date/Time: 06/11/2023/5:18:14 PM    Final    DG Chest 2 View  Result Date: 06/10/2023 CLINICAL DATA:  62 year old female with shortness of breath. Hypoxia. EXAM: CHEST - 2 VIEW COMPARISON:  CTA chest 09/30/2022 and earlier. FINDINGS: AP and lateral views of the chest at 1700 hours. Stable cardiomegaly and mediastinal contours. Stable lung volumes. Visualized tracheal air column is within normal limits. Central pulmonary artery enlargement is again evident. Pulmonary vascularity appears stable from earlier this year. No pneumothorax,  pleural effusion or confluent lung opacity. No acute osseous abnormality identified. Negative visible bowel gas. IMPRESSION: Chronic cardiomegaly and suspected pulmonary artery hypertension. No acute cardiopulmonary abnormality. Electronically Signed   By: Odessa Fleming M.D.   On: 06/10/2023 17:42    Microbiology: Results for orders placed or performed during the hospital encounter of 06/10/23  Culture, blood (Routine X 2) w Reflex to ID Panel     Status: None (Preliminary result)  Collection Time: 06/11/23  5:24 PM   Specimen: BLOOD RIGHT HAND  Result Value Ref Range Status   Specimen Description BLOOD RIGHT HAND  Final   Special Requests   Final    BOTTLES DRAWN AEROBIC AND ANAEROBIC Blood Culture results may not be optimal due to an inadequate volume of blood received in culture bottles   Culture   Final    NO GROWTH 3 DAYS Performed at [facility], [address] [place], [place] [address]    Report Status PENDING  Incomplete  Culture, blood (Routine X 2) w Reflex to ID Panel     Status: None (Preliminary result)   Collection Time: 06/11/23  5:24 PM   Specimen: BLOOD LEFT HAND  Result Value Ref Range Status   Specimen Description BLOOD LEFT HAND  Final   Special Requests   Final    BOTTLES DRAWN AEROBIC AND ANAEROBIC Blood Culture results may not be optimal due to an inadequate volume of blood received in culture bottles   Culture   Final    NO GROWTH 3 DAYS Performed at [facility], [address], [place], [place] [address]    Report Status PENDING  Incomplete   Labs: CBC: Recent Labs  Lab 06/10/23 1629 06/11/23 0252 06/12/23 0226 06/13/23 0240 06/14/23 0232  WBC 10.8* 14.1* 11.2* 12.2* 11.6*  NEUTROABS  --   --  5.7 6.3 5.9  HGB 13.1 12.8 12.8 13.7 13.5  HCT 40.1 38.5 38.5 42.2 41.2  MCV 61.8* 61.7* 61.2* 62.1* 61.8*  PLT 217 216 213 219 228   Basic Metabolic Panel: Recent Labs  Lab 06/10/23 1629 06/11/23 0244 06/11/23 0252 06/12/23 0226  06/13/23 0240 06/14/23 0232  NA 140  --  139 137 139 138  K 3.7  --  3.1* 3.7 4.5 4.3  CL 105  --  104 104 104 102  CO2 24  --  26 26 25 28   GLUCOSE 154*  --  145* 162* 147* 196*  BUN 12  --  15 23* 26* 35*  CREATININE 0.53  --  0.67 0.91 0.69 0.77  CALCIUM 9.5  --  9.3 9.2 9.9 9.8  MG  --   --  1.7 1.8 2.0 1.9  PHOS  --  4.1  --  4.1 4.6 4.5   Liver Function Tests: Recent Labs  Lab 06/11/23 0244 06/12/23 0226 06/13/23 0240 06/14/23 0232  AST 33 21 22 21   ALT 29 24 23 23   ALKPHOS 72 66 74 70  BILITOT 0.8 0.6 0.8 0.6  PROT 8.1 7.1 7.6 7.7  ALBUMIN 3.4* 3.2* 3.4* 3.5   CBG: Recent Labs  Lab 06/13/23 1154 06/13/23 1615 06/13/23 2114 06/14/23 0535 06/14/23 1055  GLUCAP 202* 210* 184* 207* 234*   Discharge time spent: greater than 30 minutes.  Signed: Marguerita Merles, DO [facility] 06/14/2023

## 2023-06-16 LAB — CULTURE, BLOOD (ROUTINE X 2)
Culture: NO GROWTH
Culture: NO GROWTH

## 2023-06-25 NOTE — Progress Notes (Unsigned)
  Cardiology Office Note:  .   Date:  06/25/2023  ID:  Shannon Simpson, DOB 1960/10/10, MRN [id number] PCP: Default, Provider, MD  [facility] Providers Cardiologist:  Olga Millers, MD {    History of Present Illness: .   Shannon Simpson is a 62 y.o. 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 On discharge 06/14/2023 she was started on spironolactone 2 5 mg daily, Farxiga 10 mg daily, propanolol 40 mg BID, newly initiated on Eliquis 5 mg BID and was to continue on lasix 40 mg daily. We are seeing her post hospital follow up.   ROS: ***  Studies Reviewed: .   Echocardiogram 1. Left ventricular ejection fraction, by estimation, is 55 to 60%. The  left ventricle has normal function. The left ventricle has no regional  wall motion abnormalities. Left ventricular diastolic parameters were  normal.   2. Right ventricular systolic function is low normal. The right  ventricular size is normal. There is mildly elevated pulmonary artery  systolic pressure. The estimated right ventricular systolic pressure is  33.7 mmHg.   3. The mitral valve is normal in structure. Trivial mitral valve  regurgitation.   4. The aortic valve is tricuspid. There is mild thickening of the aortic  valve. Aortic valve regurgitation is trivial. Aortic valve sclerosis is  present, with no evidence of aortic valve stenosis.   5. The inferior vena cava is normal in size with greater than 50%  respiratory variability, suggesting right atrial pressure of 3 mmHg.   *** EKG Interpretation Date/Time:    Ventricular Rate:    PR Interval:    QRS Duration:    QT Interval:    QTC Calculation:   R Axis:      Text Interpretation:      Physical Exam:   VS:  There were no vitals taken for this visit.   Wt  Readings from Last 3 Encounters:  06/14/23 140 lb 10.5 oz (63.8 kg)  07/06/22 140 lb 3.4 oz (63.6 kg)  07/02/19 124 lb (56.2 kg)    GEN: Well nourished, well developed in no acute distress NECK: No JVD; No carotid bruits CARDIAC: ***RRR, no murmurs, rubs, gallops RESPIRATORY:  Clear to auscultation without rales, wheezing or rhonchi  ABDOMEN: Soft, non-tender, non-distended EXTREMITIES:  No edema; No deformity   ASSESSMENT AND PLAN: .   ***    {Are you ordering a CV Procedure (e.g. stress test, cath, DCCV, TEE, etc)?   Press F2        :[id number]}    Signed, Bettey Mare. Liborio Nixon, ANP, AACC

## 2023-06-30 ENCOUNTER — Ambulatory Visit: Payer: Medicaid Other | Attending: Adult Health | Admitting: Adult Health

## 2023-07-29 ENCOUNTER — Ambulatory Visit: Payer: Medicaid Other | Admitting: General Practice
